# Patient Record
Sex: Male | Born: 1935 | Race: White | Hispanic: No | Marital: Married | Smoking: Former smoker
Health system: Southern US, Community
[De-identification: ages and names within clinical notes are randomized; demographics above are authoritative.]

## PROBLEM LIST (undated history)

## (undated) DIAGNOSIS — C61 Malignant neoplasm of prostate: Secondary | ICD-10-CM

## (undated) DIAGNOSIS — M1990 Unspecified osteoarthritis, unspecified site: Secondary | ICD-10-CM

## (undated) DIAGNOSIS — R42 Dizziness and giddiness: Secondary | ICD-10-CM

## (undated) DIAGNOSIS — J189 Pneumonia, unspecified organism: Secondary | ICD-10-CM

## (undated) DIAGNOSIS — H9190 Unspecified hearing loss, unspecified ear: Secondary | ICD-10-CM

## (undated) DIAGNOSIS — R351 Nocturia: Secondary | ICD-10-CM

## (undated) DIAGNOSIS — R1310 Dysphagia, unspecified: Secondary | ICD-10-CM

## (undated) DIAGNOSIS — Z860100 Personal history of colon polyps, unspecified: Secondary | ICD-10-CM

## (undated) DIAGNOSIS — M549 Dorsalgia, unspecified: Secondary | ICD-10-CM

## (undated) DIAGNOSIS — Z9181 History of falling: Principal | ICD-10-CM

## (undated) DIAGNOSIS — R2681 Unsteadiness on feet: Secondary | ICD-10-CM

## (undated) DIAGNOSIS — F329 Major depressive disorder, single episode, unspecified: Secondary | ICD-10-CM

## (undated) DIAGNOSIS — Z8546 Personal history of malignant neoplasm of prostate: Secondary | ICD-10-CM

## (undated) DIAGNOSIS — G4733 Obstructive sleep apnea (adult) (pediatric): Secondary | ICD-10-CM

## (undated) DIAGNOSIS — G4730 Sleep apnea, unspecified: Secondary | ICD-10-CM

## (undated) DIAGNOSIS — D126 Benign neoplasm of colon, unspecified: Secondary | ICD-10-CM

## (undated) DIAGNOSIS — K649 Unspecified hemorrhoids: Secondary | ICD-10-CM

## (undated) DIAGNOSIS — E785 Hyperlipidemia, unspecified: Secondary | ICD-10-CM

## (undated) DIAGNOSIS — K225 Diverticulum of esophagus, acquired: Secondary | ICD-10-CM

## (undated) DIAGNOSIS — I10 Essential (primary) hypertension: Secondary | ICD-10-CM

## (undated) DIAGNOSIS — IMO0001 Reserved for inherently not codable concepts without codable children: Secondary | ICD-10-CM

## (undated) DIAGNOSIS — L853 Xerosis cutis: Secondary | ICD-10-CM

## (undated) HISTORY — DX: Unspecified osteoarthritis, unspecified site: M19.90

## (undated) HISTORY — DX: Obstructive sleep apnea (adult) (pediatric): G47.33

## (undated) HISTORY — DX: Personal history of malignant neoplasm of prostate: Z85.46

## (undated) HISTORY — PX: CATARACT EXTRACTION: SUR2

## (undated) HISTORY — DX: History of falling: Z91.81

## (undated) HISTORY — DX: Malignant neoplasm of prostate: C61

## (undated) HISTORY — DX: Dysphagia, unspecified: R13.10

## (undated) HISTORY — DX: Sleep apnea, unspecified: G47.30

## (undated) HISTORY — DX: Hyperlipidemia, unspecified: E78.5

## (undated) HISTORY — DX: Unspecified hearing loss, unspecified ear: H91.90

## (undated) HISTORY — PX: TONSILLECTOMY: SUR1361

## (undated) HISTORY — DX: Unsteadiness on feet: R26.81

## (undated) HISTORY — DX: Personal history of colonic polyps: Z86.010

## (undated) HISTORY — DX: Essential (primary) hypertension: I10

## (undated) HISTORY — DX: Benign neoplasm of colon, unspecified: D12.6

## (undated) HISTORY — PX: COLONOSCOPY: SHX174

## (undated) HISTORY — DX: Major depressive disorder, single episode, unspecified: F32.9

---

## 1996-06-22 HISTORY — PX: LUMBAR LAMINECTOMY: SHX95

## 1999-04-04 ENCOUNTER — Encounter: Admission: RE | Admit: 1999-04-04 | Discharge: 1999-04-04 | Payer: Self-pay | Admitting: *Deleted

## 1999-06-23 HISTORY — PX: KNEE ARTHROSCOPY: SUR90

## 2003-08-30 ENCOUNTER — Encounter: Payer: Self-pay | Admitting: Internal Medicine

## 2003-11-13 ENCOUNTER — Encounter: Admission: RE | Admit: 2003-11-13 | Discharge: 2003-11-13 | Payer: Self-pay | Admitting: Internal Medicine

## 2003-12-12 ENCOUNTER — Encounter: Admission: RE | Admit: 2003-12-12 | Discharge: 2003-12-12 | Payer: Self-pay | Admitting: Neurosurgery

## 2004-03-30 ENCOUNTER — Ambulatory Visit (HOSPITAL_BASED_OUTPATIENT_CLINIC_OR_DEPARTMENT_OTHER): Admission: RE | Admit: 2004-03-30 | Discharge: 2004-03-30 | Payer: Self-pay | Admitting: Internal Medicine

## 2004-04-03 ENCOUNTER — Inpatient Hospital Stay (HOSPITAL_COMMUNITY): Admission: EM | Admit: 2004-04-03 | Discharge: 2004-04-04 | Payer: Self-pay | Admitting: Emergency Medicine

## 2004-04-30 ENCOUNTER — Ambulatory Visit: Payer: Self-pay | Admitting: Internal Medicine

## 2004-07-31 ENCOUNTER — Ambulatory Visit: Payer: Self-pay | Admitting: Internal Medicine

## 2004-12-08 ENCOUNTER — Ambulatory Visit: Payer: Self-pay | Admitting: Internal Medicine

## 2005-03-10 ENCOUNTER — Ambulatory Visit: Payer: Self-pay | Admitting: Internal Medicine

## 2005-04-07 ENCOUNTER — Ambulatory Visit: Payer: Self-pay | Admitting: Internal Medicine

## 2005-04-28 ENCOUNTER — Ambulatory Visit: Payer: Self-pay | Admitting: Internal Medicine

## 2005-05-15 ENCOUNTER — Ambulatory Visit: Payer: Self-pay | Admitting: Internal Medicine

## 2005-12-22 ENCOUNTER — Ambulatory Visit: Admission: RE | Admit: 2005-12-22 | Discharge: 2006-02-10 | Payer: Self-pay | Admitting: Radiation Oncology

## 2005-12-29 ENCOUNTER — Ambulatory Visit (HOSPITAL_COMMUNITY): Admission: RE | Admit: 2005-12-29 | Discharge: 2005-12-29 | Payer: Self-pay | Admitting: Urology

## 2006-01-28 ENCOUNTER — Ambulatory Visit: Payer: Self-pay | Admitting: Internal Medicine

## 2006-03-26 ENCOUNTER — Ambulatory Visit: Payer: Self-pay | Admitting: Internal Medicine

## 2006-06-22 HISTORY — PX: RADIOACTIVE SEED IMPLANT: SHX5150

## 2006-06-29 ENCOUNTER — Ambulatory Visit: Admission: RE | Admit: 2006-06-29 | Discharge: 2006-09-15 | Payer: Self-pay | Admitting: Radiation Oncology

## 2006-08-04 ENCOUNTER — Encounter: Admission: RE | Admit: 2006-08-04 | Discharge: 2006-08-04 | Payer: Self-pay | Admitting: Urology

## 2006-08-11 ENCOUNTER — Ambulatory Visit (HOSPITAL_BASED_OUTPATIENT_CLINIC_OR_DEPARTMENT_OTHER): Admission: RE | Admit: 2006-08-11 | Discharge: 2006-08-11 | Payer: Self-pay | Admitting: Urology

## 2006-11-02 ENCOUNTER — Ambulatory Visit: Payer: Self-pay | Admitting: Internal Medicine

## 2006-11-02 DIAGNOSIS — E785 Hyperlipidemia, unspecified: Secondary | ICD-10-CM | POA: Insufficient documentation

## 2006-11-02 DIAGNOSIS — I10 Essential (primary) hypertension: Secondary | ICD-10-CM | POA: Insufficient documentation

## 2006-11-02 DIAGNOSIS — E119 Type 2 diabetes mellitus without complications: Secondary | ICD-10-CM

## 2006-11-02 DIAGNOSIS — Z8546 Personal history of malignant neoplasm of prostate: Secondary | ICD-10-CM

## 2006-11-02 LAB — CONVERTED CEMR LAB
Basophils Absolute: 0 10*3/uL (ref 0.0–0.1)
Basophils Relative: 0.1 % (ref 0.0–1.0)
Eosinophils Absolute: 0 10*3/uL (ref 0.0–0.6)
Eosinophils Relative: 0.6 % (ref 0.0–5.0)
HCT: 37.4 % — ABNORMAL LOW (ref 39.0–52.0)
Hemoglobin: 13 g/dL (ref 13.0–17.0)
Lymphocytes Relative: 14.8 % (ref 12.0–46.0)
MCHC: 34.9 g/dL (ref 30.0–36.0)
MCV: 91.7 fL (ref 78.0–100.0)
Monocytes Absolute: 0.5 10*3/uL (ref 0.2–0.7)
Monocytes Relative: 9.1 % (ref 3.0–11.0)
Neutro Abs: 4.6 10*3/uL (ref 1.4–7.7)
Neutrophils Relative %: 75.4 % (ref 43.0–77.0)
Platelets: 210 10*3/uL (ref 150–400)
RBC: 4.07 M/uL — ABNORMAL LOW (ref 4.22–5.81)
RDW: 12.4 % (ref 11.5–14.6)
WBC: 6 10*3/uL (ref 4.5–10.5)

## 2006-11-04 ENCOUNTER — Encounter: Admission: RE | Admit: 2006-11-04 | Discharge: 2006-11-04 | Payer: Self-pay | Admitting: Internal Medicine

## 2006-11-05 ENCOUNTER — Encounter: Payer: Self-pay | Admitting: Internal Medicine

## 2006-11-05 ENCOUNTER — Ambulatory Visit: Payer: Self-pay

## 2006-12-16 ENCOUNTER — Encounter: Payer: Self-pay | Admitting: Internal Medicine

## 2007-04-01 ENCOUNTER — Ambulatory Visit: Payer: Self-pay | Admitting: Internal Medicine

## 2007-07-29 ENCOUNTER — Ambulatory Visit: Payer: Self-pay | Admitting: Internal Medicine

## 2007-08-01 LAB — CONVERTED CEMR LAB
ALT: 24 units/L (ref 0–53)
AST: 27 units/L (ref 0–37)
Albumin: 4.1 g/dL (ref 3.5–5.2)
Alkaline Phosphatase: 52 units/L (ref 39–117)
BUN: 15 mg/dL (ref 6–23)
Bilirubin, Direct: 0.2 mg/dL (ref 0.0–0.3)
CO2: 29 meq/L (ref 19–32)
Calcium: 9.6 mg/dL (ref 8.4–10.5)
Chloride: 106 meq/L (ref 96–112)
Cholesterol: 137 mg/dL (ref 0–200)
Creatinine, Ser: 1.1 mg/dL (ref 0.4–1.5)
Creatinine,U: 110 mg/dL
GFR calc Af Amer: 83 mL/min
GFR calc non Af Amer: 68 mL/min
Glucose, Bld: 101 mg/dL — ABNORMAL HIGH (ref 70–99)
HDL: 49.7 mg/dL (ref >39.0)
Hgb A1c MFr Bld: 6.2 % — ABNORMAL HIGH (ref 4.6–6.0)
LDL Cholesterol: 72 mg/dL (ref 0–99)
Microalb Creat Ratio: 15.5 mg/g (ref 0.0–30.0)
Microalb, Ur: 1.7 mg/dL (ref 0.0–1.9)
Potassium: 4.9 meq/L (ref 3.5–5.1)
Sodium: 142 meq/L (ref 135–145)
Total Bilirubin: 0.9 mg/dL (ref 0.3–1.2)
Total CHOL/HDL Ratio: 2.8
Total Protein: 6.3 g/dL (ref 6.0–8.3)
Triglycerides: 76 mg/dL (ref 0–149)
VLDL: 15 mg/dL (ref 0–40)

## 2007-08-19 ENCOUNTER — Ambulatory Visit: Payer: Self-pay | Admitting: Gastroenterology

## 2007-08-19 LAB — CONVERTED CEMR LAB: PSA: 0.09 ng/mL

## 2007-08-21 DIAGNOSIS — D126 Benign neoplasm of colon, unspecified: Secondary | ICD-10-CM

## 2007-08-21 HISTORY — DX: Benign neoplasm of colon, unspecified: D12.6

## 2007-08-29 ENCOUNTER — Ambulatory Visit: Payer: Self-pay | Admitting: Internal Medicine

## 2007-09-06 ENCOUNTER — Encounter: Payer: Self-pay | Admitting: Gastroenterology

## 2007-09-06 ENCOUNTER — Ambulatory Visit: Payer: Self-pay | Admitting: Gastroenterology

## 2007-09-06 ENCOUNTER — Encounter: Payer: Self-pay | Admitting: Internal Medicine

## 2008-02-16 ENCOUNTER — Telehealth: Payer: Self-pay | Admitting: *Deleted

## 2008-02-28 ENCOUNTER — Ambulatory Visit: Payer: Self-pay | Admitting: Internal Medicine

## 2008-02-28 LAB — CONVERTED CEMR LAB
ALT: 23 units/L (ref 0–53)
AST: 23 units/L (ref 0–37)
Albumin: 4 g/dL (ref 3.5–5.2)
Alkaline Phosphatase: 45 units/L (ref 39–117)
BUN: 18 mg/dL (ref 6–23)
Bilirubin, Direct: 0.1 mg/dL (ref 0.0–0.3)
CO2: 28 meq/L (ref 19–32)
Calcium: 9 mg/dL (ref 8.4–10.5)
Chloride: 109 meq/L (ref 96–112)
Cholesterol: 123 mg/dL (ref 0–200)
Creatinine, Ser: 1 mg/dL (ref 0.4–1.5)
GFR calc Af Amer: 92 mL/min
GFR calc non Af Amer: 76 mL/min
Glucose, Bld: 103 mg/dL — ABNORMAL HIGH (ref 70–99)
HDL: 46.5 mg/dL (ref >39.0)
Hgb A1c MFr Bld: 6.2 % — ABNORMAL HIGH (ref 4.6–6.0)
LDL Cholesterol: 62 mg/dL (ref 0–99)
Potassium: 4.2 meq/L (ref 3.5–5.1)
Sodium: 144 meq/L (ref 135–145)
Total Bilirubin: 1.2 mg/dL (ref 0.3–1.2)
Total CHOL/HDL Ratio: 2.6
Total Protein: 6.4 g/dL (ref 6.0–8.3)
Triglycerides: 73 mg/dL (ref 0–149)
VLDL: 15 mg/dL (ref 0–40)

## 2008-03-06 ENCOUNTER — Ambulatory Visit: Payer: Self-pay | Admitting: Internal Medicine

## 2008-04-03 ENCOUNTER — Ambulatory Visit: Payer: Self-pay | Admitting: Internal Medicine

## 2008-04-12 ENCOUNTER — Ambulatory Visit: Payer: Self-pay | Admitting: Internal Medicine

## 2008-06-05 ENCOUNTER — Ambulatory Visit: Payer: Self-pay | Admitting: Internal Medicine

## 2008-06-06 LAB — CONVERTED CEMR LAB
ALT: 21 units/L (ref 0–53)
AST: 20 units/L (ref 0–37)
BUN: 22 mg/dL (ref 6–23)
CO2: 28 meq/L (ref 19–32)
Calcium: 9.1 mg/dL (ref 8.4–10.5)
Chloride: 109 meq/L (ref 96–112)
Cholesterol: 132 mg/dL (ref 0–200)
Creatinine, Ser: 1.1 mg/dL (ref 0.4–1.5)
GFR calc Af Amer: 82 mL/min
GFR calc non Af Amer: 68 mL/min
Glucose, Bld: 114 mg/dL — ABNORMAL HIGH (ref 70–99)
HDL: 56.7 mg/dL (ref >39.0)
Hgb A1c MFr Bld: 6.2 % — ABNORMAL HIGH (ref 4.6–6.0)
LDL Cholesterol: 61 mg/dL (ref 0–99)
Potassium: 4.1 meq/L (ref 3.5–5.1)
Sodium: 145 meq/L (ref 135–145)
Total CHOL/HDL Ratio: 2.3
Triglycerides: 70 mg/dL (ref 0–149)
VLDL: 14 mg/dL (ref 0–40)

## 2008-06-09 ENCOUNTER — Encounter: Admission: RE | Admit: 2008-06-09 | Discharge: 2008-06-09 | Payer: Self-pay | Admitting: Internal Medicine

## 2008-07-23 HISTORY — PX: ROTATOR CUFF REPAIR: SHX139

## 2008-07-24 ENCOUNTER — Ambulatory Visit (HOSPITAL_COMMUNITY): Admission: RE | Admit: 2008-07-24 | Discharge: 2008-07-25 | Payer: Self-pay | Admitting: Orthopedic Surgery

## 2008-08-02 ENCOUNTER — Telehealth: Payer: Self-pay | Admitting: Gastroenterology

## 2008-08-02 ENCOUNTER — Encounter (INDEPENDENT_AMBULATORY_CARE_PROVIDER_SITE_OTHER): Payer: Self-pay | Admitting: *Deleted

## 2008-08-07 ENCOUNTER — Ambulatory Visit: Payer: Self-pay | Admitting: Gastroenterology

## 2008-11-05 ENCOUNTER — Ambulatory Visit: Payer: Self-pay | Admitting: Gastroenterology

## 2008-11-05 DIAGNOSIS — Z860100 Personal history of colon polyps, unspecified: Secondary | ICD-10-CM

## 2008-11-05 HISTORY — DX: Personal history of colon polyps, unspecified: Z86.0100

## 2008-11-05 HISTORY — DX: Personal history of colonic polyps: Z86.010

## 2008-11-07 ENCOUNTER — Ambulatory Visit: Payer: Self-pay | Admitting: Gastroenterology

## 2008-11-07 ENCOUNTER — Encounter: Payer: Self-pay | Admitting: Gastroenterology

## 2008-11-07 ENCOUNTER — Encounter: Payer: Self-pay | Admitting: Internal Medicine

## 2008-11-07 ENCOUNTER — Ambulatory Visit: Payer: Self-pay

## 2008-11-09 ENCOUNTER — Encounter: Payer: Self-pay | Admitting: Gastroenterology

## 2009-03-29 ENCOUNTER — Ambulatory Visit: Payer: Self-pay | Admitting: Internal Medicine

## 2009-04-16 ENCOUNTER — Telehealth: Payer: Self-pay | Admitting: Internal Medicine

## 2009-05-02 ENCOUNTER — Telehealth: Payer: Self-pay | Admitting: Internal Medicine

## 2009-05-03 ENCOUNTER — Ambulatory Visit: Payer: Self-pay | Admitting: Family Medicine

## 2009-06-19 ENCOUNTER — Encounter: Payer: Self-pay | Admitting: Internal Medicine

## 2009-08-29 ENCOUNTER — Ambulatory Visit: Payer: Self-pay | Admitting: Internal Medicine

## 2009-08-29 LAB — CONVERTED CEMR LAB

## 2009-09-02 LAB — CONVERTED CEMR LAB
ALT: 22 units/L (ref 0–53)
AST: 20 units/L (ref 0–37)
Albumin: 4.1 g/dL (ref 3.5–5.2)
Alkaline Phosphatase: 56 units/L (ref 39–117)
BUN: 19 mg/dL (ref 6–23)
Basophils Absolute: 0 10*3/uL (ref 0.0–0.1)
Basophils Relative: 0.1 % (ref 0.0–3.0)
Bilirubin, Direct: 0 mg/dL (ref 0.0–0.3)
CO2: 29 meq/L (ref 19–32)
Calcium: 9 mg/dL (ref 8.4–10.5)
Chloride: 110 meq/L (ref 96–112)
Cholesterol: 141 mg/dL (ref 0–200)
Creatinine, Ser: 1.1 mg/dL (ref 0.4–1.5)
Eosinophils Absolute: 0.1 10*3/uL (ref 0.0–0.7)
Eosinophils Relative: 1.6 % (ref 0.0–5.0)
GFR calc non Af Amer: 67.8 mL/min (ref >60)
Glucose, Bld: 100 mg/dL — ABNORMAL HIGH (ref 70–99)
HCT: 39.7 % (ref 39.0–52.0)
HDL: 62.2 mg/dL (ref >39.00)
Hemoglobin: 13.2 g/dL (ref 13.0–17.0)
Hgb A1c MFr Bld: 6.3 % (ref 4.6–6.5)
LDL Cholesterol: 58 mg/dL (ref 0–99)
Lymphocytes Relative: 17.3 % (ref 12.0–46.0)
Lymphs Abs: 1.1 10*3/uL (ref 0.7–4.0)
MCHC: 33.3 g/dL (ref 30.0–36.0)
MCV: 96.8 fL (ref 78.0–100.0)
Monocytes Absolute: 0.4 10*3/uL (ref 0.1–1.0)
Monocytes Relative: 6.8 % (ref 3.0–12.0)
Neutro Abs: 4.6 10*3/uL (ref 1.4–7.7)
Neutrophils Relative %: 74.2 % (ref 43.0–77.0)
Platelets: 190 10*3/uL (ref 150.0–400.0)
Potassium: 3.9 meq/L (ref 3.5–5.1)
RBC: 4.1 M/uL — ABNORMAL LOW (ref 4.22–5.81)
RDW: 12.6 % (ref 11.5–14.6)
Sodium: 144 meq/L (ref 135–145)
TSH: 2.71 microintl units/mL (ref 0.35–5.50)
Total Bilirubin: 1.1 mg/dL (ref 0.3–1.2)
Total CHOL/HDL Ratio: 2
Total Protein: 6.9 g/dL (ref 6.0–8.3)
Triglycerides: 102 mg/dL (ref 0.0–149.0)
VLDL: 20.4 mg/dL (ref 0.0–40.0)
WBC: 6.2 10*3/uL (ref 4.5–10.5)

## 2009-09-05 IMAGING — CR DG CHEST 1V PORT
1 series · 1 of 1 positions shown · non-contrast
Comparison: 07/19/2008

CLINICAL DATA: Low O2 sat postop

PORTABLE CHEST - 1 VIEW

[view not recorded]
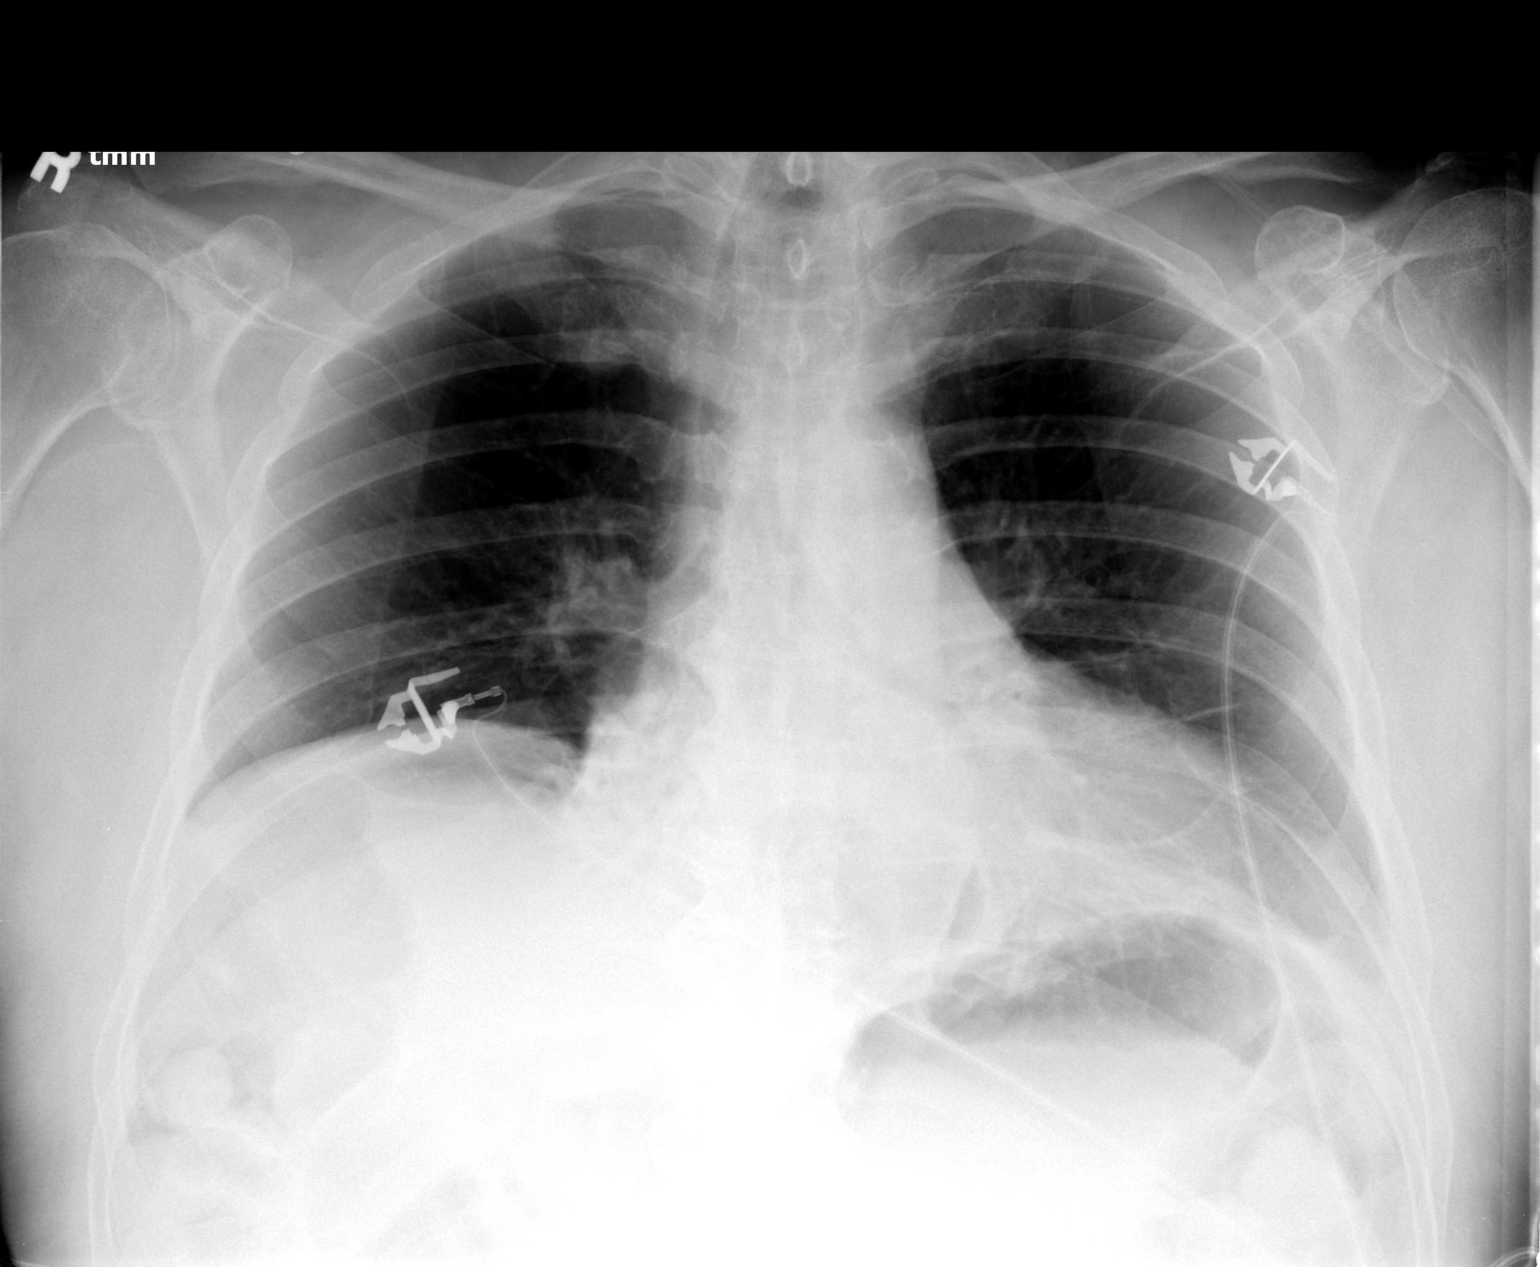

[1 of 1 positions shown; findings below may reference images not displayed]

FINDINGS: There is a moderate bilateral lower lobe subsegmental
atelectasis - left greater than right.  This is a new finding.  No
heart failure or pleural fluid.
IMPRESSION: Moderate postoperative basilar atelectasis.

## 2009-10-25 ENCOUNTER — Encounter (INDEPENDENT_AMBULATORY_CARE_PROVIDER_SITE_OTHER): Payer: Self-pay | Admitting: *Deleted

## 2009-12-17 ENCOUNTER — Ambulatory Visit: Payer: Self-pay | Admitting: Gastroenterology

## 2009-12-31 ENCOUNTER — Ambulatory Visit: Payer: Self-pay | Admitting: Gastroenterology

## 2010-01-03 ENCOUNTER — Encounter: Payer: Self-pay | Admitting: Gastroenterology

## 2010-01-16 ENCOUNTER — Telehealth: Payer: Self-pay | Admitting: Internal Medicine

## 2010-03-21 ENCOUNTER — Ambulatory Visit: Payer: Self-pay | Admitting: Internal Medicine

## 2010-05-02 ENCOUNTER — Ambulatory Visit: Payer: Self-pay | Admitting: Internal Medicine

## 2010-05-02 DIAGNOSIS — M549 Dorsalgia, unspecified: Secondary | ICD-10-CM | POA: Insufficient documentation

## 2010-07-22 NOTE — Assessment & Plan Note (Signed)
Summary: flu shot/njr   Nurse Visit   Allergies: No Known Drug Allergies  Orders Added: 1)  Flu Vaccine 8yrs + MEDICARE PATIENTS [Q2039] 2)  Administration Flu vaccine - MCR [G0008]       Flu Vaccine Consent Questions     Do you have a history of severe allergic reactions to this vaccine? no    Any prior history of allergic reactions to egg and/or gelatin? no    Do you have a sensitivity to the preservative Thimersol? no    Do you have a past history of Guillan-Barre Syndrome? no    Do you currently have an acute febrile illness? no    Have you ever had a severe reaction to latex? no    Vaccine information given and explained to patient? yes    Are you currently pregnant? no    Lot Number:[id number]   Exp Date:12/20/2010   Site Given  Left Deltoid IM

## 2010-07-22 NOTE — Assessment & Plan Note (Signed)
Summary: Darius Castro will come in fasting/njr/Darius Castro rescd from bump//ccm   Vital Signs:  Patient profile:   77 year old male Height:      68 inches Weight:      174 pounds Temp:     98.2 degrees F oral Pulse rate:   72 / minute Pulse rhythm:   regular Resp:     12 per minute BP sitting:   118 / 72  (left arm) Cuff size:   regular  Vitals Entered By: Gladis Riffle, RN (August 29, 2009 11:28 AM) CC: annual review, fasting Is Patient Diabetic? No   Primary Care Provider:  Birdie Sons, MD  CC:  annual review and fasting.  History of Present Illness: Here for Medicare AWV:  1.   Risk factors based on Past M, S, F history:see note 2.   Physical Activities: he remains physically active 3.   Depression/mood: -denies depression 4.   Hearing: no problem 5.   ADL's: he isa ble to do all of ADLs 6.   Fall Risk: no hx of falls 7.   Home Safety: lives independently at friend's home  Current Problems: here for evaluation PROSTATE CANCER, HX OF (ICD-V10.46)--no known recurrence HYPERTENSION (ICD-401.9)-patient denies chest pain, shortness breath, PND, orthopnea. HYPERLIPIDEMIA (ICD-272.4)--patient tolerating medications without difficulty. DIABETES MELLITUS, TYPE II (ICD-250.00)--diet controlled.  Patient has not required medications in the past.  Patient remains quite active and exercises several times weekly.  All other systems reviewed and were negative      Preventive Screening-Counseling & Management  Alcohol-Tobacco     Smoking Status: never  Current Problems (verified): 1)  Colonic Polyps, Hx of  (ICD-V12.72) 2)  Preventive Health Care  (ICD-V70.0) 3)  Prostate Cancer, Hx of  (ICD-V10.46) 4)  Hypertension  (ICD-401.9) 5)  Hyperlipidemia  (ICD-272.4) 6)  Diabetes Mellitus, Type II  (ICD-250.00)  Current Medications (verified): 1)  Fosinopril Sodium 40 Mg Tabs (Fosinopril Sodium) .... Take 1 Tablet By Mouth Once A Day 2)  Lipitor 40 Mg Tabs (Atorvastatin Calcium) .... Take 1 Tablet  By Mouth Once A Day 3)  Topamax 25 Mg  Tabs (Topiramate) .... Take 1 Tablet By Mouth Once A Day 4)  Aspirin 325 Mg Tabs (Aspirin) .... Once Daily 5)  Vitamin C Cr 500 Mg Cr-Tabs (Ascorbic Acid) .... Once Daily 6)  Centrum Silver  Tabs (Multiple Vitamins-Minerals) .... Once Daily 7)  Vitamin D 1000 Unit Tabs (Cholecalciferol) .... Take One By Mouth Once Daily  Allergies (verified): No Known Drug Allergies  Past History:  Past Medical History: Last updated: 11/05/2008 Diabetes mellitus, type II-diet controlled  Hyperlipidemia Hypertension Prostate cancer, hx of-seed implants 2008 Hemorrhoids, hx of Arthritis Sleep Apnea  Adenomatous Colon Polyps 08/2007  Past Surgical History: Last updated: 11/05/2008 Lumbar laminectomy  1998 Tonsillectomy, as child knee arthroscopy R  2001 right rotator cuff 07/2008  Family History: Last updated: 11/05/2008 Family History of CAD Male 1st degree relative <50 mother deceased 69 Family History of Colon Polyps: Daughter in her 52's No FH of Colon Cancer:  Social History: Last updated: 11/05/2008 Retired Married Never Smoked Alcohol Use - no Illicit Drug Use - no Patient gets regular exercise.  Risk Factors: Exercise: yes (11/05/2008)  Risk Factors: Smoking Status: never (08/29/2009)  Review of Systems       All other systems reviewed and were negative   Physical Exam  General:  alert and well-developed.   Head:  normocephalic and atraumatic.   Eyes:  pupils equal and pupils round.  Ears:  R ear normal and L ear normal.   Neck:  supple no adenopathy Lungs:  normal respiratory effort and no intercostal retractions.   Abdomen:  soft and non-tender.   Msk:  No deformity or scoliosis noted of thoracic or lumbar spine.   Neurologic:  cranial nerves II-XII intact and gait normal.   Skin:  turgor normal and color normal.   Psych:  memory intact for recent and remote and good eye contact.     Impression &  Recommendations:  Problem # 1:  PROSTATE CANCER, HX OF (ICD-V10.46)  followed by urology  Orders: TLB-CBC Platelet - w/Differential (85025-CBCD)  Problem # 2:  HYPERTENSION (ICD-401.9)  controlled continue current medications  His updated medication list for this problem includes:    Fosinopril Sodium 40 Mg Tabs (Fosinopril sodium) .Marland Kitchen... Take 1 tablet by mouth once a day  BP today: 118/72 Prior BP: 180/100 (05/03/2009)  Labs Reviewed: K+: 4.1 (06/05/2008) Creat: : 1.1 (06/05/2008)   Chol: 132 (06/05/2008)   HDL: 56.7 (06/05/2008)   LDL: 61 (06/05/2008)   TG: 70 (06/05/2008)  Orders: TLB-BMP (Basic Metabolic Panel-BMET) (80048-METABOL)  Problem # 3:  DIABETES MELLITUS, TYPE II (ICD-250.00)  needs lab s controlled on no meds His updated medication list for this problem includes:    Fosinopril Sodium 40 Mg Tabs (Fosinopril sodium) .Marland Kitchen... Take 1 tablet by mouth once a day    Aspirin 325 Mg Tabs (Aspirin) ..... Once daily  Labs Reviewed: Creat: 1.1 (06/05/2008)     Last Eye Exam: normal (11/21/2007) Reviewed HgBA1c results: 6.2 (06/05/2008)  6.2 (02/28/2008)  Orders: TLB-A1C / Hgb A1C (Glycohemoglobin) (83036-A1C)  Problem # 4:  HYPERLIPIDEMIA (ICD-272.4)  labs today continue current medications  His updated medication list for this problem includes:    Lipitor 40 Mg Tabs (Atorvastatin calcium) .Marland Kitchen... Take 1 tablet by mouth once a day  Labs Reviewed: SGOT: 20 (06/05/2008)   SGPT: 21 (06/05/2008)   HDL:56.7 (06/05/2008), 46.5 (02/28/2008)  LDL:61 (06/05/2008), 62 (02/28/2008)  Chol:132 (06/05/2008), 123 (02/28/2008)  Trig:70 (06/05/2008), 73 (02/28/2008)  Orders: Venipuncture (62952) TLB-Hepatic/Liver Function Pnl (80076-HEPATIC) TLB-Lipid Panel (80061-LIPID) TLB-TSH (Thyroid Stimulating Hormone) (84443-TSH)  Problem # 5:  PREVENTIVE HEALTH CARE (ICD-V70.0)  wellness medicare appropriate labs ordered see history of present illness for details.  Orders: EMR  Misc Charge Code [facility])  Complete Medication List: 1)  Fosinopril Sodium 40 Mg Tabs (Fosinopril sodium) .... Take 1 tablet by mouth once a day 2)  Lipitor 40 Mg Tabs (Atorvastatin calcium) .... Take 1 tablet by mouth once a day 3)  Topamax 25 Mg Tabs (Topiramate) .... Take 1 tablet by mouth once a day 4)  Aspirin 325 Mg Tabs (Aspirin) .... Once daily 5)  Vitamin C Cr 500 Mg Cr-tabs (Ascorbic acid) .... Once daily 6)  Centrum Silver Tabs (Multiple vitamins-minerals) .... Once daily 7)  Vitamin D 1000 Unit Tabs (Cholecalciferol) .... Take one by mouth once daily  Other Orders: TD Toxoids IM 7 YR + (84132) Admin 1st Vaccine (44010) Pneumococcal Vaccine (27253) Admin of Any Addtl Vaccine (66440)   Preventive Care Screening  Colonoscopy:    Date:  11/07/2008    Next Due:  11/2009    Results:  abnormal     Immunizations Administered:  Tetanus Vaccine:    Vaccine Type: Td    Site: left deltoid    Mfr: Sanofi Pasteur    Dose: 0.5 ml    Route: IM    Given by: Gladis Riffle, RN  Exp. Date: 10/03/2010    Lot #: [id number]  Pneumonia Vaccine:    Vaccine Type: Pneumovax (Medicare)    Site: right deltoid    Mfr: Merck    Dose: 0.5 ml    Route: IM    Given by: Gladis Riffle, RN    Exp. Date: 10/10/2010    Lot #: [id number]  Prevention & Chronic Care Immunizations   Influenza vaccine: Fluvax 3+  (03/29/2009)   Influenza vaccine due: 02/20/2010    Tetanus booster: 08/29/2009: Td   Tetanus booster due: 08/30/2019    Pneumococcal vaccine: Pneumovax (Medicare)  (08/29/2009)   Pneumococcal vaccine due: 08/30/2014    H. zoster vaccine: 01/28/2006: Zostavax  Colorectal Screening   Hemoccult: Not documented   Hemoccult action/deferral: Not indicated  (08/29/2009)    Colonoscopy: abnormal  (11/07/2008)   Colonoscopy action/deferral: Not indicated  (08/29/2009)   Colonoscopy due: 11/2009  Other Screening   PSA: 0.09  (08/19/2007)   PSA action/deferral: Not indicated   (08/29/2009)   Smoking status: never  (08/29/2009)  Diabetes Mellitus   HgbA1C: 6.2  (06/05/2008)   HgbA1C action/deferral: Ordered  (08/29/2009)    Eye exam: normal  (11/21/2007)    Foot exam: Not documented   High risk foot: Not documented   Foot care education: Not documented    Urine microalbumin/creatinine ratio: 15.5  (07/29/2007)    Diabetes flowsheet reviewed?: Yes   Progress toward A1C goal: At goal  Lipids   Total Cholesterol: 132  (06/05/2008)   LDL: 61  (06/05/2008)   LDL Direct: Not documented   HDL: 56.7  (06/05/2008)   Triglycerides: 70  (06/05/2008)    SGOT (AST): 20  (06/05/2008)   SGPT (ALT): 21  (06/05/2008)   Alkaline phosphatase: 45  (02/28/2008)   Total bilirubin: 1.2  (02/28/2008)    Lipid flowsheet reviewed?: Yes   Progress toward LDL goal: At goal  Hypertension   Last Blood Pressure: 118 / 72  (08/29/2009)   Serum creatinine: 1.1  (06/05/2008)   BMP action: Ordered   Serum potassium 4.1  (06/05/2008)    Hypertension flowsheet reviewed?: Yes   Progress toward BP goal: At goal  Self-Management Support :    Diabetes self-management support: Not documented    Hypertension self-management support: Not documented    Lipid self-management support: Not documented    Nursing Instructions: Give Pneumovax today HgbA1C today (see order)

## 2010-07-22 NOTE — Progress Notes (Signed)
Summary: refill  Phone Note Refill Request Message from:  Fax from Pharmacy on January 16, 2010 11:53 AM  Refills Requested: Medication #1:  TOPAMAX 25 MG  TABS Take 1 tablet by mouth once a day Initial call taken by: Kern Reap CMA Duncan Dull),  January 16, 2010 11:53 AM    Prescriptions: TOPAMAX 25 MG  TABS (TOPIRAMATE) Take 1 tablet by mouth once a day  #90 x 1   Entered by:   Kern Reap CMA (AAMA)   Authorized by:   Birdie Sons MD   Signed by:   Kern Reap CMA (AAMA) on 01/16/2010   Method used:   Electronically to        [facility]* (mail-order)       [address] [place], [place]  [address]       Ph: [phone]       Fax: [phone]   RxID:   [id number]

## 2010-07-22 NOTE — Assessment & Plan Note (Signed)
Summary: REC COL/OVER 80.Marland KitchenMarland KitchenAS.        History of Present Illness Visit Type: Follow-up Visit Primary GI MD: Elie Goody MD [facility] Primary Provider: Birdie Sons, MD Chief Complaint: Patient recieved a letter for recall colonoscopy and due to his age he needed an office visit. He states he is doing great.  History of Present Illness:   Darius Castro returns for followup of a large ascending colon polyp. He underwent piecemeal polypectomy in March 2009. Followup colonoscopy in May 2010 showed a persistent polyp in the descending colon which was again removed by piecemeal technique. He returns  today with no gastrointestinal complaints. He has stable hypertension, hyperlipidemia, diabetes mellitus, and sleep apnea.    GI Review of Systems      Denies abdominal pain, acid reflux, belching, bloating, chest pain, dysphagia with liquids, dysphagia with solids, heartburn, loss of appetite, nausea, vomiting, vomiting blood, weight loss, and  weight gain.        Denies anal fissure, black tarry stools, change in bowel habit, constipation, diarrhea, diverticulosis, fecal incontinence, heme positive stool, hemorrhoids, irritable bowel syndrome, jaundice, light color stool, liver problems, rectal bleeding, and  rectal pain.   Current Medications (verified): 1)  Fosinopril Sodium 40 Mg Tabs (Fosinopril Sodium) .... Take 1 Tablet By Mouth Once A Day 2)  Lipitor 40 Mg Tabs (Atorvastatin Calcium) .... Take 1 Tablet By Mouth Once A Day 3)  Topamax 25 Mg  Tabs (Topiramate) .... Take 1 Tablet By Mouth Once A Day 4)  Aspirin 325 Mg Tabs (Aspirin) .... Once Daily 5)  Vitamin C Cr 500 Mg Cr-Tabs (Ascorbic Acid) .... Once Daily 6)  Centrum Silver  Tabs (Multiple Vitamins-Minerals) .... Once Daily 7)  Vitamin D 1000 Unit Tabs (Cholecalciferol) .... Take One By Mouth Once Daily 8)  Zyrtec Allergy 10 Mg Caps (Cetirizine Hcl) .... Take One By Mouth Once Daily  Allergies (verified): No Known Drug  Allergies  Past History:  Past Medical History: Diabetes mellitus, type II-diet controlled  Hyperlipidemia Hypertension Prostate cancer, hx of-seed implants 2008 Hemorrhoids, hx of Arthritis Sleep Apnea  Adenomatous Colon Polyps 08/2007  Past Surgical History: Lumbar laminectomy  1998 Tonsillectomy, as child knee arthroscopy R  2001 right rotator cuff 07/2008 Cataract Extraction bilateral  Family History: Reviewed history from 11/05/2008 and no changes required. Family History of CAD Male 1st degree relative <50 mother deceased 2 Family History of Colon Polyps: Daughter in her 56's No FH of Colon Cancer:  Social History: Reviewed history from 11/05/2008 and no changes required. Retired Married Never Smoked Alcohol Use - no Illicit Drug Use - no Patient gets regular exercise.  Review of Systems       The patient complains of allergy/sinus, hearing problems, urination - excessive, and urine leakage.         The pertinent positives and negatives are noted as above and in the HPI. All other ROS were reviewed and were negative.   Vital Signs:  Patient profile:   77 year old male Height:      68 inches Weight:      173.4 pounds BMI:     26.46 Pulse rate:   72 / minute Pulse rhythm:   regular BP sitting:   126 / 66  (left arm) Cuff size:   regular  Vitals Entered By: Darius Castro CMA Duncan Dull) (December 17, 2009 3:41 PM)  Physical Exam  General:  Well developed, well nourished, no acute distress. Head:  Normocephalic and atraumatic. Eyes:  PERRLA,  no icterus. Mouth:  No deformity or lesions, dentition normal. Lungs:  Clear throughout to auscultation. Heart:  Regular rate and rhythm; no murmurs, rubs,  or bruits. Abdomen:  Soft, nontender and nondistended. No masses, hepatosplenomegaly or hernias noted. Normal bowel sounds. Rectal:  deferred until time of colonoscopy.   Psych:  Alert and cooperative. Normal mood and affect.  Impression &  Recommendations:  Problem # 1:  COLONIC POLYPS, HX OF (ICD-V12.72)  Personal history of a large adenomatous polyp in the ascending colon, which was not completely removed in 2009 or in 2010. The site was tattooed. The risks, benefits and alternatives to colonoscopy with possible biopsy and possible polypectomy were discussed with the patient and they consent to proceed. The procedure will be scheduled electively.  Orders: Colonoscopy (Colon)  Patient Instructions: 1)  Colonoscopy brochure given.  2)  Please continue current medications.  3)  The medication list was reviewed and reconciled.  All changed / newly prescribed medications were explained.  A complete medication list was provided to the patient / caregiver.  Prescriptions: MOVIPREP 100 GM  SOLR (PEG-KCL-NACL-NASULF-NA ASC-C) As per prep instructions.  #1 x 0   Entered by:   Darius Castro CMA (AAMA)   Authorized by:   Meryl Dare MD [facility]   Signed by:   Meryl Dare MD [facility] on 12/17/2009   Method used:   Electronically to        [id number]* (retail)       [address]       [place], [place]  [address]       Ph: [phone] or [phone]       Fax: [phone]   RxID:   [id number]   Appended Document: REC COL/OVER 80.Marland KitchenMarland KitchenAS. CORRECTION IN HPI, should read as follows: Colonoscopy in May 2010 showed RESIDUAL polyp in the ASCENDING colon.

## 2010-07-22 NOTE — Letter (Signed)
Summary: Colonoscopy Letter  [facility]  [address] [place], [place] [address]   Phone: [phone]  Fax: [phone]      Oct 25, 2009 MRN: [id number]   [facility] [address] [place], [place]  [address]   Dear Mr. Muraoka,   According to your medical record, it is time for you to schedule a Colonoscopy. The American Cancer Society recommends this procedure as a method to detect early colon cancer. Patients with a family history of colon cancer, or a personal history of colon polyps or inflammatory bowel disease are at increased risk.  This letter has beeen generated based on the recommendations made at the time of your procedure. If you feel that in your particular situation this may no longer apply, please contact our office.  Please call our office at [phone] to schedule this appointment or to update your records at your earliest convenience.  Thank you for cooperating with [place] to provide you with the very best care possible.   Sincerely,  [name] T. Russella Dar, M.D.  [facility] [phone]

## 2010-07-22 NOTE — Assessment & Plan Note (Signed)
Summary: back pain/cdw   Vital Signs:  Patient profile:   77 year old male Weight:      176 pounds Temp:     97.6 degrees F oral BP sitting:   108 / 62  (left arm) Cuff size:   large  Vitals Entered By: Alfred Levins, CMA (May 02, 2010 10:01 AM) CC: back pain x10 days, Back pain   Primary Care Provider:  Birdie Sons, MD  CC:  back pain x10 days and Back pain.  History of Present Illness:  Back Pain      This is an 77 year old man who presents with Back pain.  The symptoms began 10 days ago.  The patient denies fever, chills, and weakness.  The pain is located in the right low back.  The pain began at home and suddenly.  The pain is made worse by standing or walking and activity.  The pain is made better by inactivity.  Risk factors for serious underlying conditions include age >= 50 years.   has used some hydrocodone/ms relaxers (wife's meds)  All other systems reviewed and were negative   Current Medications (verified): 1)  Fosinopril Sodium 40 Mg Tabs (Fosinopril Sodium) .... Take 1 Tablet By Mouth Once A Day 2)  Lipitor 40 Mg Tabs (Atorvastatin Calcium) .... Take 1 Tablet By Mouth Once A Day 3)  Topamax 25 Mg  Tabs (Topiramate) .... Take 1 Tablet By Mouth Once A Day 4)  Aspirin 325 Mg Tabs (Aspirin) .... Once Daily 5)  Vitamin C Cr 500 Mg Cr-Tabs (Ascorbic Acid) .... Once Daily 6)  Centrum Silver  Tabs (Multiple Vitamins-Minerals) .... Once Daily 7)  Vitamin D 1000 Unit Tabs (Cholecalciferol) .... Take One By Mouth Once Daily 8)  Zyrtec Allergy 10 Mg Caps (Cetirizine Hcl) .... Take One By Mouth Once Daily  Allergies (verified): No Known Drug Allergies  Physical Exam  General:  well-developed, elderly male in no acute distress. HEENT exam atraumatic, normocephalic symmetric muscles are intact. Neck is supple. Chest clear to auscultation cardiac exam S1-S2 are regular. Musculoskeletal exam: No palpable tenderness of the back. Straight leg raise is negative. Deep tendon  reflexes at the knees are symmetric and normal.  deep tendon reflexes are absent at the ankles.   Impression & Recommendations:  Problem # 1:  BACK PAIN (ICD-724.5)  improving discussed with patient and wife I think you can acute muscle spasm. Symptoms are gradually improving. I'll treat aggressively with nonsteroidal anti-inflammatory drug, muscle relaxer and pain medication. He'll call me next week if he is not dramatically improving. His updated medication list for this problem includes:    Aspirin 325 Mg Tabs (Aspirin) ..... Once daily    Meloxicam 7.5 Mg Tabs (Meloxicam) ..... One by mouth daily with food for 14 days    Hydrocodone-acetaminophen 5-325 Mg Tabs (Hydrocodone-acetaminophen) .Marland Kitchen... 1 by mouth up to 4 times per day as needed for pain    Methocarbamol 500 Mg Tabs (Methocarbamol) .Marland Kitchen... Take 1 tablet by mouth three times a day as needed back pain  Complete Medication List: 1)  Fosinopril Sodium 40 Mg Tabs (Fosinopril sodium) .... Take 1 tablet by mouth once a day 2)  Lipitor 40 Mg Tabs (Atorvastatin calcium) .... Take 1 tablet by mouth once a day 3)  Topamax 25 Mg Tabs (Topiramate) .... Take 1 tablet by mouth once a day 4)  Aspirin 325 Mg Tabs (Aspirin) .... Once daily 5)  Vitamin C Cr 500 Mg Cr-tabs (Ascorbic acid) .... Once  daily 6)  Centrum Silver Tabs (Multiple vitamins-minerals) .... Once daily 7)  Vitamin D 1000 Unit Tabs (Cholecalciferol) .... Take one by mouth once daily 8)  Zyrtec Allergy 10 Mg Caps (Cetirizine hcl) .... Take one by mouth once daily 9)  Meloxicam 7.5 Mg Tabs (Meloxicam) .... One by mouth daily with food for 14 days 10)  Hydrocodone-acetaminophen 5-325 Mg Tabs (Hydrocodone-acetaminophen) .Marland Kitchen.. 1 by mouth up to 4 times per day as needed for pain 11)  Methocarbamol 500 Mg Tabs (Methocarbamol) .... Take 1 tablet by mouth three times a day as needed back pain  Patient Instructions: 1)  . Prescriptions: METHOCARBAMOL 500 MG TABS (METHOCARBAMOL) Take 1  tablet by mouth three times a day as needed back pain  #30 x 0   Entered and Authorized by:   Birdie Sons MD   Signed by:   Birdie Sons MD on 05/02/2010   Method used:   Electronically to        [facility] (retail)       [address]       [place], [place]  [address]       Ph: [phone] or [phone]       Fax: [phone]   RxID:   [id number] HYDROCODONE-ACETAMINOPHEN 5-325 MG TABS (HYDROCODONE-ACETAMINOPHEN) 1 by mouth up to 4 times per day as needed for pain  #30 x 0   Entered and Authorized by:   Birdie Sons MD   Signed by:   Birdie Sons MD on 05/02/2010   Method used:   Print then Give to Patient   RxID:   [id number] MELOXICAM 7.5 MG  TABS (MELOXICAM) one by mouth daily with food for 14 days  #14 x 0   Entered and Authorized by:   Birdie Sons MD   Signed by:   Birdie Sons MD on 05/02/2010   Method used:   Electronically to        [facility] (retail)       [address]       [place], [place]  [address]       Ph: [phone] or [phone]       Fax: [phone]   RxID:   [id number]    Orders Added: 1)  Est. Patient Level IV [[address]]

## 2010-07-22 NOTE — Procedures (Signed)
Summary: Colonoscopy  Patient: Darius Castro Note: All result statuses are Final unless otherwise noted.  Tests: (1) Colonoscopy (COL)   COL Colonoscopy           DONE     [facility]     [address]     [place], [place]  [address]           COLONOSCOPY PROCEDURE REPORT           PATIENT:  Darius Castro, Darius Castro  MR#:  [id number]     BIRTHDATE:  12-Apr-1924, 86 yrs. old  GENDER:  male     ENDOSCOPIST:  [name] T. Russella Dar, MD, [facility]           PROCEDURE DATE:  12/31/2009     PROCEDURE:  Colonoscopy with snare polypectomy     ASA CLASS:  Class II     INDICATIONS:  1) follow-up of polyp  2) surveillance and high-risk     screening, adenomatous ascending colon polyp removed piecemeal in     08/2007 and 10/2008.     MEDICATIONS:   Fentanyl 50 mcg IV, Versed 7 mg IV     DESCRIPTION OF PROCEDURE:   After the risks benefits and     alternatives of the procedure were thoroughly explained, informed     consent was obtained.  Digital rectal exam was performed and     revealed no abnormalities.   The LB PCF-H180AL [id number] endoscope     was introduced through the anus and advanced to the cecum, which     was identified by both the appendix and ileocecal valve, limited     by a tortuous and redundant colon, fair prep.    The quality of     the prep was Moviprep fair.  The instrument was then slowly     withdrawn as the colon was fully examined.     <<PROCEDUREIMAGES>>     FINDINGS:  A sessile polyp was found in the ascending colon. It     was 6 mm in size. at site of prior polypectomy with adjacent     tattoos noted. Polyp was snared, then cauterized with monopolar     cautery. Retrieval was successful. The polyp was completely     removed. A normal appearing cecum, ileocecal valve, and     appendiceal orifice were identified. The hepatic flexure,     transverse, splenic flexure, descending, sigmoid colon, and rectum     appeared unremarkable. Retroflexed views in the rectum revealed     internal  hemorrhoids, small.  The time to cecum =  8  minutes. The     scope was then withdrawn (time =  13.33  min) from the patient and     the procedure completed.           COMPLICATIONS:  None           ENDOSCOPIC IMPRESSION:     1) 6 mm sessile polyp in the ascending colon     2) Internal hemorrhoids           RECOMMENDATIONS:     1) No aspirin or NSAID's for 2 weeks     2) Await pathology results     3) OP follow-up is advised on a PRN basis.     4) Given complete polypectomy today and patients age will not plan     for future surveillance colonoscopies           Nayzeth Altman T.  Russella Dar, MD, [facility]           CC: Lindley Magnus, MD           n.     Rosalie DoctorVenita Lick. Gayanne Prescott at 12/31/2009 04:42 PM           Zara Chess, [id number]  Note: An exclamation mark (!) indicates a result that was not dispersed into the flowsheet. Document Creation Date: 12/31/2009 4:43 PM _______________________________________________________________________  (1) Order result status: Final Collection or observation date-time: 12/31/2009 16:30 Requested date-time:  Receipt date-time:  Reported date-time:  Referring Physician:   Ordering Physician: Claudette Head [phone]) Specimen Source:  Source: Launa Grill Order Number: [id number] Lab site:

## 2010-07-22 NOTE — Medication Information (Signed)
Summary: Possible Drug Interaction/[facility]  Possible Drug Interaction/[facility]   Imported By: Maryln Gottron 07/04/2009 14:18:00  _____________________________________________________________________  External Attachment:    Type:   Image     Comment:   External Document

## 2010-07-22 NOTE — Letter (Signed)
Summary: [facility] Instructions  [facility]  [address] [place], [place] [address]   Phone: [phone]  Fax: [phone]       Darius Castro    11/19/1923    MRN: [id number]        Procedure Day /Date: Tuesday July 12th, 2011     Arrival Time: 3:00pm     Procedure Time: 4:00pm     Location of Procedure:                    _x _  [facility] (4th Floor)                        PREPARATION FOR COLONOSCOPY WITH MOVIPREP   Starting 5 days prior to your procedure 12/26/09 do not eat nuts, seeds, popcorn, corn, beans, peas,  salads, or any raw vegetables.  Do not take any fiber supplements (e.g. Metamucil, Citrucel, and Benefiber).  THE DAY BEFORE YOUR PROCEDURE         DATE: 12/30/09  DAY: Monday  1.  Drink clear liquids the entire day-NO SOLID FOOD  2.  Do not drink anything colored red or purple.  Avoid juices with pulp.  No orange juice.  3.  Drink at least 64 oz. (8 glasses) of fluid/clear liquids during the day to prevent dehydration and help the prep work efficiently.  CLEAR LIQUIDS INCLUDE: Water Jello Ice Popsicles Tea (sugar ok, no milk/cream) Powdered fruit flavored drinks Coffee (sugar ok, no milk/cream) Gatorade Juice: apple, white grape, white cranberry  Lemonade Clear bullion, consomm, broth Carbonated beverages (any kind) Strained chicken noodle soup Hard Candy                             4.  In the morning, mix first dose of MoviPrep solution:    Empty 1 Pouch A and 1 Pouch B into the disposable container    Add lukewarm drinking water to the top line of the container. Mix to dissolve    Refrigerate (mixed solution should be used within 24 hrs)  5.  Begin drinking the prep at 5:00 p.m. The MoviPrep container is divided by 4 marks.   Every 15 minutes drink the solution Castro to the next mark (approximately 8 oz) until the full liter is complete.   6.  Follow completed prep with 16 oz of clear liquid of your choice (Nothing  red or purple).  Continue to drink clear liquids until bedtime.  7.  Before going to bed, mix second dose of MoviPrep solution:    Empty 1 Pouch A and 1 Pouch B into the disposable container    Add lukewarm drinking water to the top line of the container. Mix to dissolve    Refrigerate  THE DAY OF YOUR PROCEDURE      DATE: 12/31/09 DAY: Tuesday  Beginning at 11:00 a.m. (5 hours before procedure):         1. Every 15 minutes, drink the solution Castro to the next mark (approx 8 oz) until the full liter is complete.  2. Follow completed prep with 16 oz. of clear liquid of your choice.    3. You may drink clear liquids until 2:00pm (2 HOURS BEFORE PROCEDURE).   MEDICATION INSTRUCTIONS  Unless otherwise instructed, you should take regular prescription medications with a small sip of water   as early as possible the morning of your  procedure.         OTHER INSTRUCTIONS  You will need a responsible adult at least 75 years of age to accompany you and drive you home.   This person must remain in the waiting room during your procedure.  Wear loose fitting clothing that is easily removed.  Leave jewelry and other valuables at home.  However, you may wish to bring a book to read or  an iPod/MP3 player to listen to music as you wait for your procedure to start.  Remove all body piercing jewelry and leave at home.  Total time from sign-in until discharge is approximately 2-3 hours.  You should go home directly after your procedure and rest.  You can resume normal activities the  day after your procedure.  The day of your procedure you should not:   Drive   Make legal decisions   Operate machinery   Drink alcohol   Return to work  You will receive specific instructions about eating, activities and medications before you leave.    The above instructions have been reviewed and explained to me by   Marchelle Folks.     I fully understand and can verbalize these instructions  _____________________________ Date _________

## 2010-07-22 NOTE — Letter (Signed)
Summary: Patient Notice- Polyp Results  [facility]  [address] [place], [place] [address]   Phone: [phone]  Fax: [phone]        January 03, 2010 MRN: [id number]    [facility] [address] [place], [place]  [address]    Dear Mr. Bohall,  I am pleased to inform you that the colon polyp(s) removed during your recent colonoscopy was (were) found to be benign (no cancer detected) upon pathologic examination.  Should you develop new or worsening symptoms of abdominal pain, bowel habit changes or bleeding from the rectum or bowels, please schedule an evaluation with either your primary care physician or with me.  No further action with gastroenterology is needed at this time. Please      follow-up with your primary care physician for your other healthcare      needs.  Continue treatment plan as outlined the day of your exam.  Please call us if you are having persistent problems or have questions about your condition that have not been fully answered at this time.  Sincerely,  Meryl Dare MD [facility]  This letter has been electronically signed by your physician.  Appended Document: Patient Notice- Polyp Results letter mailed.

## 2010-10-01 ENCOUNTER — Encounter: Payer: Self-pay | Admitting: Internal Medicine

## 2010-10-03 ENCOUNTER — Ambulatory Visit (INDEPENDENT_AMBULATORY_CARE_PROVIDER_SITE_OTHER): Payer: Medicare Other | Admitting: Internal Medicine

## 2010-10-03 ENCOUNTER — Encounter: Payer: Self-pay | Admitting: Internal Medicine

## 2010-10-03 DIAGNOSIS — N419 Inflammatory disease of prostate, unspecified: Secondary | ICD-10-CM

## 2010-10-03 DIAGNOSIS — Z8546 Personal history of malignant neoplasm of prostate: Secondary | ICD-10-CM

## 2010-10-03 DIAGNOSIS — Z0000 Encounter for general adult medical examination without abnormal findings: Secondary | ICD-10-CM

## 2010-10-03 DIAGNOSIS — I10 Essential (primary) hypertension: Secondary | ICD-10-CM

## 2010-10-03 DIAGNOSIS — E785 Hyperlipidemia, unspecified: Secondary | ICD-10-CM

## 2010-10-03 DIAGNOSIS — E119 Type 2 diabetes mellitus without complications: Secondary | ICD-10-CM

## 2010-10-03 LAB — BASIC METABOLIC PANEL
BUN: 21 mg/dL (ref 6–23)
Calcium: 8.9 mg/dL (ref 8.4–10.5)
GFR: 69.81 mL/min (ref >60.00)
Glucose, Bld: 91 mg/dL (ref 70–99)
Potassium: 4.1 mEq/L (ref 3.5–5.1)

## 2010-10-03 LAB — HEPATIC FUNCTION PANEL
ALT: 18 U/L (ref 0–53)
Bilirubin, Direct: 0.2 mg/dL (ref 0.0–0.3)
Total Bilirubin: 1.1 mg/dL (ref 0.3–1.2)

## 2010-10-03 LAB — PSA: PSA: 0.06 ng/mL — ABNORMAL LOW (ref 0.10–4.00)

## 2010-10-03 LAB — LIPID PANEL: VLDL: 12.8 mg/dL (ref 0.0–40.0)

## 2010-10-03 NOTE — Assessment & Plan Note (Signed)
Not as well-controlled as I would like her best to monitor his blood pressure at home. Goal blood pressure less than 130/80. He may need medication adjustment. See me back in one month.

## 2010-10-03 NOTE — Progress Notes (Signed)
Addended by: Rossie Muskrat on: 10/03/2010 09:39 AM   Modules accepted: Orders

## 2010-10-03 NOTE — Assessment & Plan Note (Signed)
Blood sugars have been elevated in the past. All check laboratory work today.

## 2010-10-03 NOTE — Assessment & Plan Note (Signed)
Needs followup laboratory work. Will be done today.

## 2010-10-03 NOTE — Progress Notes (Signed)
Subjective:    Darius Castro is a 77 y.o. male who presents for Medicare Initial preventive examination.   Preventive Screening-Counseling & Management  Tobacco History  Smoking status  . Former Smoker  . Quit date: 06/22/1961  Smokeless tobacco  . Not on file    Problems Prior to Visit 1.   Current Problems (verified) Patient Active Problem List  Diagnoses  . DIABETES MELLITUS, TYPE II  . HYPERLIPIDEMIA  . HYPERTENSION  . BACK PAIN  . PROSTATE CANCER, HX OF  . COLONIC POLYPS, HX OF    Medications Prior to Visit Current Outpatient Prescriptions on File Prior to Visit  Medication Sig Dispense Refill  . Ascorbic Acid (VITAMIN C) 500 MG tablet Take 500 mg by mouth daily.        Marland Kitchen aspirin 325 MG tablet Take 325 mg by mouth daily.        Marland Kitchen atorvastatin (LIPITOR) 40 MG tablet Take 40 mg by mouth daily.        . cetirizine (ZYRTEC) 10 MG tablet Take 10 mg by mouth daily.        . cholecalciferol (VITAMIN D) 1000 UNITS tablet Take 1,000 Units by mouth daily.        . fosinopril (MONOPRIL) 40 MG tablet Take 40 mg by mouth daily.        Marland Kitchen topiramate (TOPAMAX) 25 MG tablet Take 25 mg by mouth daily.        Marland Kitchen HYDROcodone-acetaminophen (NORCO) 5-325 MG per tablet Take 1 tablet by mouth 4 (four) times daily.        . meloxicam (MOBIC) 7.5 MG tablet Take 7.5 mg by mouth daily.        . methocarbamol (ROBAXIN) 500 MG tablet Take 500 mg by mouth 3 (three) times daily.        . Multiple Vitamin (MULTIVITAMIN) tablet Take 1 tablet by mouth daily.          Current Medications (verified) Current Outpatient Prescriptions  Medication Sig Dispense Refill  . Ascorbic Acid (VITAMIN C) 500 MG tablet Take 500 mg by mouth daily.        Marland Kitchen aspirin 325 MG tablet Take 325 mg by mouth daily.        Marland Kitchen atorvastatin (LIPITOR) 40 MG tablet Take 40 mg by mouth daily.        . Biotin 5000 MCG CAPS Take 1 capsule by mouth daily.        . cetirizine (ZYRTEC) 10 MG tablet Take 10 mg by mouth daily.          . cholecalciferol (VITAMIN D) 1000 UNITS tablet Take 1,000 Units by mouth daily.        Marland Kitchen co-enzyme Q-10 30 MG capsule Take 30 mg by mouth daily.        . fosinopril (MONOPRIL) 40 MG tablet Take 40 mg by mouth daily.        Marland Kitchen topiramate (TOPAMAX) 25 MG tablet Take 25 mg by mouth daily.        Marland Kitchen HYDROcodone-acetaminophen (NORCO) 5-325 MG per tablet Take 1 tablet by mouth 4 (four) times daily.        . meloxicam (MOBIC) 7.5 MG tablet Take 7.5 mg by mouth daily.        . methocarbamol (ROBAXIN) 500 MG tablet Take 500 mg by mouth 3 (three) times daily.        . Multiple Vitamin (MULTIVITAMIN) tablet Take 1 tablet by mouth daily.  Allergies (verified) Review of patient's allergies indicates no known allergies.   PAST HISTORY  Family History Family History  Problem Relation Age of Onset  . Heart disease Father   . Heart attack Father   . Colon polyps Daughter   . Colon cancer Neg Hx   . Hypertension Mother     Social History History  Substance Use Topics  . Smoking status: Former Smoker    Quit date: 06/22/1961  . Smokeless tobacco: Not on file  . Alcohol Use: No    Are there smokers in your home (other than you)?  No  Risk Factors Current exercise habits: The patient does not participate in regular exercise at present. but he does walk regularly Dietary issues discussed: he follows a good diet   Cardiac risk factors: advanced age (older than 22 for men, 31 for women), dyslipidemia, hypertension and male gender.  Depression Screen (Note: if answer to either of the following is "Yes", a more complete depression screening is indicated)   Q1: Over the past two weeks, have you felt down, depressed or hopeless? No  Q2: Over the past two weeks, have you felt little interest or pleasure in doing things? No  Have you lost interest or pleasure in daily life? No  Do you often feel hopeless? No  Do you cry easily over simple problems? No  Activities of Daily Living In  your present state of health, do you have any difficulty performing the following activities?:  Driving? yes Managing money?  Yes Feeding yourself? Yes Getting from bed to chair? Yes Climbing a flight of stairs? Yes Preparing food and eating?:  Lives at friend's home---also cooks for himself Bathing or showering? No Getting dressed: No Getting to the toilet? No Using the toilet:No Moving around from place to place: No In the past year have you fallen or had a near fall?:No   Are you sexually active?  No  Do you have more than one partner?  No  Hearing Difficulties: No--hearing aids occasional use   Do you feel that you have a problem with memory? No  Do you often misplace items? No  Do you feel safe at home?  Yes  Cognitive Testing  Alert? Yes  Normal Appearance?Yes  Oriented to person? Yes  Place? Yes   Time? Yes  Recall of three objects?  Yes  Can perform simple calculations? Yes  Displays appropriate judgment?Yes  Can read the correct time from a watch face?Yes   Advanced Directives have been discussed with the patient? Yes   Immunization History  Administered Date(s) Administered  . Influenza Whole 04/01/2007, 04/03/2008, 03/29/2009, 03/21/2010  . Pneumococcal Polysaccharide 02/05/2003, 08/29/2009  . Td 08/29/2009  . Zoster 01/28/2006    Screening Tests Health Maintenance  Topic Date Due  . Influenza Vaccine  03/23/2011  . Colonoscopy  11/08/2018  . Tetanus/tdap  08/30/2019  . Pneumococcal Polysaccharide Vaccine Age 49 And Over  Completed  . Zostavax  Completed       History reviewed: allergies, current medications, past family history, past medical history, past social history, past surgical history and problem list  Review of Systems   patient denies chest pain, shortness of breath, orthopnea. Denies lower extremity edema, abdominal pain, change in appetite, change in bowel movements. Patient denies rashes, musculoskeletal complaints. No other specific  complaints in a complete review of systems.    Objective:     Vision by Snellen chart: right eye:20/20, left eye:20/20 uses reading glasses Blood pressure 162/96, pulse  68, temperature 98.6 F (37 C), temperature source Oral, height 5' 7.5" (1.715 m), weight 167 lb (75.751 kg). Body mass index is 25.77 kg/(m^2).  Well-developed male in no acute distress. HEENT exam atraumatic, normocephalic, extraocular muscles are intact. Conjunctivae are pink without exudate. Neck is supple without lymphadenopathy, thyromegaly, jugular venous distention. Chest is clear to auscultation without increased work of breathing. Cardiac exam S1-S2 are regular. The PMI is normal. No significant murmurs or gallops. Abdominal exam active bowel sounds, soft, nontender. No abdominal bruits. Extremities no clubbing cyanosis or edema. Peripheral pulses are normal without bruits. Neurologic exam alert and oriented without any motor or sensory deficits.       Assessment:     Wellness visit     Plan:     During the course of the visit the patient was educated and counseled about appropriate screening and preventive services including:      Patient Instructions (the written plan) was given to the patient.  Medicare Attestation I have personally reviewed: The patient's medical and social history Their use of alcohol, tobacco or illicit drugs Their current medications and supplements The patient's functional ability including ADLs,fall risks, home safety risks, cognitive, and hearing and visual impairment Diet and physical activities Evidence for depression or mood disorders  The patient's weight, height, BMI, and visual acuity have been recorded in the chart.  I have made referrals, counseling, and provided education to the patient based on review of the above and I have provided the patient with a written personalized care plan for preventive services.     [name], MD   10/03/2010

## 2010-10-03 NOTE — Assessment & Plan Note (Signed)
Check PSA today. 

## 2010-10-06 LAB — URINE CULTURE: Colony Count: NO GROWTH

## 2010-10-06 LAB — COMPREHENSIVE METABOLIC PANEL
ALT: 20 U/L (ref 0–53)
Alkaline Phosphatase: 53 U/L (ref 39–117)
BUN: 23 mg/dL (ref 6–23)
CO2: 26 mEq/L (ref 19–32)
Chloride: 106 mEq/L (ref 96–112)
Glucose, Bld: 123 mg/dL — ABNORMAL HIGH (ref 70–99)
Potassium: 4.5 mEq/L (ref 3.5–5.1)
Sodium: 139 mEq/L (ref 135–145)
Total Bilirubin: 0.9 mg/dL (ref 0.3–1.2)

## 2010-10-06 LAB — URINALYSIS, ROUTINE W REFLEX MICROSCOPIC
Bilirubin Urine: NEGATIVE
Hgb urine dipstick: NEGATIVE
Ketones, ur: NEGATIVE mg/dL
Nitrite: NEGATIVE
Specific Gravity, Urine: 1.015 (ref 1.005–1.030)
Urobilinogen, UA: 1 mg/dL (ref 0.0–1.0)
pH: 6.5 (ref 5.0–8.0)

## 2010-10-06 LAB — DIFFERENTIAL
Basophils Absolute: 0 10*3/uL (ref 0.0–0.1)
Basophils Relative: 0 % (ref 0–1)
Eosinophils Absolute: 0 10*3/uL (ref 0.0–0.7)
Monocytes Relative: 7 % (ref 3–12)
Neutro Abs: 4.8 10*3/uL (ref 1.7–7.7)
Neutrophils Relative %: 72 % (ref 43–77)

## 2010-10-06 LAB — CBC
HCT: 40.6 % (ref 39.0–52.0)
Hemoglobin: 13.4 g/dL (ref 13.0–17.0)
RBC: 4.22 MIL/uL (ref 4.22–5.81)
WBC: 6.6 10*3/uL (ref 4.0–10.5)

## 2010-10-06 LAB — APTT: aPTT: 24 seconds (ref 24–37)

## 2010-10-06 LAB — PROTIME-INR: INR: 1 (ref 0.00–1.49)

## 2010-10-07 LAB — BASIC METABOLIC PANEL
Calcium: 8 mg/dL — ABNORMAL LOW (ref 8.4–10.5)
Creatinine, Ser: 1.07 mg/dL (ref 0.4–1.5)
GFR calc Af Amer: >60 mL/min (ref >60)
Sodium: 135 mEq/L (ref 135–145)

## 2010-10-07 LAB — CBC
Hemoglobin: 12.1 g/dL — ABNORMAL LOW (ref 13.0–17.0)
RBC: 3.72 MIL/uL — ABNORMAL LOW (ref 4.22–5.81)
WBC: 8 10*3/uL (ref 4.0–10.5)

## 2010-11-04 NOTE — Op Note (Signed)
NAME:  Darius Castro, Darius Castro                  ACCOUNT NO.:  [id number]   MEDICAL RECORD NO.:  [id number]          PATIENT TYPE:  OIB   LOCATION:  3308                         FACILITY:  [facility]   PHYSICIAN:  Robert A. Thurston Hole, M.D. DATE OF BIRTH:  18-Jul-1923   DATE OF PROCEDURE:  07/25/2008  DATE OF DISCHARGE:  07/25/2008                               OPERATIVE REPORT   PREOPERATIVE DIAGNOSES:  1. Right shoulder rotator cuff tear.  2. Right shoulder chronic biceps tendon rupture.  3. Right shoulder partial labrum tear.  4. Right shoulder impingement.  5. Right shoulder acromioclavicular joint degenerative joint disease      and spurring.   POSTOPERATIVE DIAGNOSES:  1. Right shoulder rotator cuff tear.  2. Right shoulder chronic biceps tendon rupture.  3. Right shoulder partial labrum tear.  4. Right shoulder impingement.  5. Right shoulder acromioclavicular joint degenerative joint disease      and spurring.   PROCEDURES:  1. Right shoulder examination under anesthesia followed by      arthroscopically assisted rotator cuff repair using Arthrex suture      anchors x2 plus push lock anchors x2.  2. Right shoulder debridement, partial labrum tear.  3. Right shoulder subacromial decompression.  4. Right shoulder distal clavicle excision.   SURGEON:  Elana Alm. Thurston Hole, MD   ASSISTANT:  Julien Girt, PA   ANESTHESIA:  General.   OPERATIVE TIME:  1 hour and 45 minutes.   COMPLICATIONS:  None.   DESCRIPTION OF PROCEDURE:  Darius Castro was brought to the operating room on  July 24, 2008, after interscalene block, he was placed in holding by  anesthesia.  He was placed on the operative table in supine position.  He received Ancef 1 g IV preoperatively for prophylaxis.  After being  placed under general anesthesia, his right shoulder was examined.  He  had full range of motion in his shoulder with stable ligamentous exam.  He was then placed in a beach chair position and his shoulder  and arm  was prepped using sterile DuraPrep and draped using sterile technique.  Originally, through a posterior arthroscopic portal, the arthroscope  with a pump attached and was placed into an anterior portal and  arthroscopic probe was placed.  On initial inspection, the articular  cartilage and a glenohumeral joint showed grade 1 and 2 chondromalacia.  The biceps tendon was found to be chronically ruptured and was  nonexistent in the articular portion of the joint.  The anterior,  posterior, and superior labrum showed mild fraying, which was debrided.  The anterior inferior labrum and anterior inferior glenohumeral ligament  complex was intact.  Superior labrum had moderate fraying, which was  debrided.  The rotator cuff showed a complete tear of the supraspinatus  and of the infraspinatus and this was partially debrided  arthroscopically.  The subscapularis and teres minor were intact.  Inferior capsular recess was free of pathology.  Subacromial space was  then entered and a lateral arthroscopic portal was made.  Moderately  thickened bursitis was resected.  Impingement was noted and a  subacromial  decompression was carried out removing 6-8 mm of the  undersurface of the anterior, anterolateral, anteromedial acromion, and  CA ligament release carried out as well.  The [facility] joint showed  significant spurring and degenerative changes and the distal 6 mm of  clavicle was resected with a 6-mm bur.  After this was done, through an  accessory lateral portal.  Two separate Arthrex 5.5-mm suture anchors  were placed in a greater tuberosity.  Each of these anchors had 2  sutures in them and each of the sutures were passed arthroscopically  through the tears of the rotator cuff and then tied down tightly with a  firm and tight repair.  This repair was further augmented with 2  separate Arthrex 4.5-mm push-lock anchors in the greater tuberosity thus  further securing the sutures in place with a  firm and tight repair.  After this was done, the shoulder could be brought through a full range  of motion with no impingement on the repair.  At this point, I felt that  all pathology had been satisfactorily addressed.  The instruments were  removed.  Portals were closed with 3-0 nylon suture.  Sterile dressings  and a sling applied and the patient awakened and taken to recovery room  in stable condition.   FOLLOWUP CARE:  Darius Castro will be followed overnight for observation due  to multiple medical conditions.  He will be discharged tomorrow if  stable.  He will be seen back in my office in a week for sutures out and  follow up.      Robert A. Thurston Hole, M.D.  Electronically Signed     RAW/MEDQ  D:  07/25/2008  T:  07/25/2008  Job:  [id number]

## 2010-11-04 NOTE — Assessment & Plan Note (Signed)
[facility]                         GASTROENTEROLOGY OFFICE NOTE   Darius Castro, Darius Castro                         MRN:          [id number]  DATE:08/19/2007                            DOB:          Nov 06, 1923    REFERRING PHYSICIAN:  Valetta Mole. Swords, MD   REASON FOR CONSULTATION:  Family history of colon polyps and a personal  history of prostate cancer.   HISTORY OF PRESENT ILLNESS:  Darius Castro is an 77 year old white male, who  has no ongoing colorectal complaints.  His daughter was diagnosed with  precancerous colon polyps in her thirties.  The patient was diagnosed  with prostate cancer and is status post radiation seed implantation  approximately two years ago.  He has had a prior history of hemorrhoids,  but they have not been active in many years and he has rare episodes of  heartburn, related to certain foods.  He has no abdominal pain, rectal  pain, change in bowel habits, change in stool caliber, melena,  hematochezia or weight-loss.  No other family members with colon polyps,  other than his daughter.  No family members with colon cancer or  inflammatory bowel disease.  He states he has had multiple  sigmoidoscopies over the years and, for several years, he states he had  a flexible sigmoidoscopy performed every year.  He thinks his last  sigmoidoscopy may have been by Dr. Boyd Kerbs or Dr. Shonna Chock  in 2000 or 2002.   PAST MEDICAL HISTORY:  History of prostate cancer  hypertension  hyperlipidemia  diabetes mellitus - type 2  arthritis  sleep apnea   PAST SURGICAL HISTORY:  Status post right-knee surgery  status post lumbar laminectomy   CURRENT MEDICATIONS:  Listed on the chart, updated and reviewed.   MEDICATION ALLERGIES:  None known.   SOCIAL HISTORY:  Per the handwritten form.   REVIEW OF SYSTEMS:  Per the handwritten form.   PHYSICAL EXAM:  Well-developed, well-nourished, white male, in no acute  distress.  Height 5  feet 10 inches, weight 178.4 pounds.  Blood pressure is 140/80,  pulse 73 and regular.  HEENT EXAM:  Anicteric sclerae.  Oropharynx clear.  CHEST:  Clear to auscultation bilaterally.  CARDIAC:  Regular rate and rhythm without murmurs appreciated.  ABDOMEN:  Soft, nontender, nondistended.  Normoactive bowel sounds, no  palpable organomegaly, masses or hernias.  RECTAL EXAMINATION:  Deferred to time of colonoscopy.  EXTREMITIES:  Without clubbing, cyanosis or edema.  NEUROLOGIC:  Alert and oriented times three, grossly nonfocal.   ASSESSMENT AND PLAN:  Family history of colon polyps-daughter with colon  polyps in her thirties.  Personal history of prostate cancer.  Risks,  benefits, and alternatives to colonoscopy with possible biopsy and  possible polypectomy discussed with the patient.  He consents to  proceed.  This will be scheduled electively.  If he has no polyps or  only a few small polyps, we will not plan for future colonoscopies.     Venita Lick. Russella Dar, MD, [facility]  Electronically Signed    MTS/MedQ  DD: 08/19/2007  DT: 08/19/2007  Job #: [id number]

## 2010-11-07 NOTE — Discharge Summary (Signed)
NAME:  Darius Castro, Darius Castro                  ACCOUNT NO.:  [id number]   MEDICAL RECORD NO.:  [id number]          PATIENT TYPE:  INP   LOCATION:  2013                         FACILITY:  [facility]   PHYSICIAN:  Thomos Lemons, D.O. [facility]   DATE OF BIRTH:  May 21, 1924   DATE OF ADMISSION:  04/03/2004  DATE OF DISCHARGE:  04/04/2004                                 DISCHARGE SUMMARY   DISCHARGE DIAGNOSES:  1.  Chest pain.  2.  Myocardial infarction, ruled out.  3.  Pulmonary embolus, ruled out.  4.  Aortic dissection, ruled out.  5.  Musculoskeletal pain.  6.  Hepatic cysts, renal cysts and L5 pars defect.   HISTORY OF PRESENT ILLNESS:  Mr. Methot is a 77 year old, white male who was  seen in the office by Dr. Cato Mulligan on April 03, 2004, with complaints of mid  scapular back pain x1 week.  The night prior to admission, pain had radiated  to his chest and down his left arm.  His left arm felt tight and his fifth  finger was aching.  He denied any weakness.  He described the chest pain as  dull and mild, but enough to wake him up.   PAST MEDICAL HISTORY:  1.  Hypertension.  2.  Hyperlipidemia.  3.  Diet-controlled diabetes.   HOSPITAL COURSE:  Problem 1.  CARDIOVASCULAR:  The patient was admitted with  chest pain to rule out myocardial infarction.  Serial cardiac enzymes were  negative.  EKG was without ischemia, although it did reveal some sinus  bradycardia with first-degree AV block.  The patient had a treadmill  Cardiolite in March 2005, that was negative for ischemia.  Therefore, it is  not felt further cardiac workup was indicated.  The patient also had a CT of  the chest which was negative for PE or aortic dissection.  The patient is  tender on palpation between his shoulder blades.  He denies any recent  trauma, but we suspect his pain is more likely musculoskeletal than cardiac  or pulmonary etiology.  The patient was getting relief from heating pad and  Advil at home.  We will have him continue  that treatment for another 3-4  days.   Problem 2.  HYPERTENSION:  His blood pressure remained stable.   DISCHARGE LABORATORY DATA AND X-RAY FINDINGS:  CBC, BMET, coagulations and  LFTs were normal.  Serial cardiac enzymes were negative.   CT of the chest was negative for PE or aortic dissection.  It did reveal  some scattered subsegmental atelectasis.  There is a hepatic cysts, renal  cysts and L5 pars defect with grade 1 anterior lithiasis.   DISCHARGE MEDICATIONS:  1.  Hydrochlorothiazide 12.5 mg q.d.  2.  Lisinopril 10 mg q.d.  3.  Lipitor 40 mg q.d.  4.  Aspirin 325 mg q.d.  5.  Naprosyn 500 mg b.i.d. x4 days.   FOLLOW UP:  Follow up with Dr. Cato Mulligan next week.  This was discussed with  Dr. Cato Mulligan prior to discharge.       LC/MEDQ  D:  04/04/2004  T:  04/04/2004  Job:  [id number]

## 2010-11-07 NOTE — Op Note (Signed)
NAME:  Darius Castro, Darius Castro                  ACCOUNT NO.:  [id number]   MEDICAL RECORD NO.:  [id number]          PATIENT TYPE:  AMB   LOCATION:  [facility]                         FACILITY:  [facility]   PHYSICIAN:  Courtney Paris, M.D.DATE OF BIRTH:  Jul 30, 1923   DATE OF PROCEDURE:  08/11/2006  DATE OF DISCHARGE:                               OPERATIVE REPORT   PREOPERATIVE DIAGNOSIS:  T2b Gleason 3 + 3 adenocarcinoma of the  prostate.   POSTOPERATIVE DIAGNOSIS:  T2b Gleason 3 + 3 adenocarcinoma of the  prostate.   OPERATION:  Brachytherapy of the prostate, cysto.   ANESTHESIA:  General.   SURGEONS:  Courtney Paris, M.D./Robert Elson Areas, M.D.   BRIEF HISTORY:  This 77 year old patient was admitted with a clinical  T2b Gleason 3 + 3 adenocarcinoma of the prostate.  PSA was elevated 5.7  in June and negative bone scan and he was admitted for brachytherapy.  He had some obstructive symptoms, but on maximal medical therapy his  IPPS has dropped to least 6 or 7 points.  He enters for definitive  operating therapy for his prostate at this time.   The patient was placed on the operating table in the dorsal lithotomy  position after satisfactory induction of general endotracheal  anesthesia, was prepped and draped with Betadine in the usual sterile  fashion.  Foley catheter and rectal tube was inserted as well as the 7.5  MHz ultrasound probe in the rectum.  The brachytherapy planning film was  then done on the table and the seeds were then delivered by the  Nucletron.  He had a total of 27 needles with 75 seeds of RA125.  When  the seeds were all placed, the catheters were then removed and a  flexible cystoscope was then used to inspect his bladder for any seeds  that might be present.  The anterior urethra showed no obstruction  except at the prostate where he had trilobar hyperplasia, a little bit  of hyperemia and the bladder was entered.  It was 2+ trabeculated with  some early cellule  formation but no seeds were seen.  There was a little  bit of hyperemia of the prostate, but again in the seeds had penetrated  the bladder.  Scope was then removed.  A #16 Foley catheter was  inserted.  The patient taken to the recovery room in good condition with  detailed written instructions.  He will have his catheter removed in a  day and a half.  We will maintain him on maximal medical therapy with  finasteride of Flomax until I see him in 3 weeks.      Courtney Paris, M.D.  Electronically Signed     HMK/MEDQ  D:  08/11/2006  T:  08/11/2006  Job:  [id number]

## 2010-11-07 NOTE — H&P (Signed)
NAME:  Appelt, Steffan                  ACCOUNT NO.:  [id number]   MEDICAL RECORD NO.:  [id number]          PATIENT TYPE:  INP   LOCATION:  2013                         FACILITY:  [facility]   PHYSICIAN:  Valetta Mole. Swords, M.D. [facility] OF BIRTH:  August 11, 1923   DATE OF ADMISSION:  04/03/2004  DATE OF DISCHARGE:                                HISTORY & PHYSICAL   CHIEF COMPLAINT:  Chest and back pain.   Mr. Doten is a 77 year old male who developed mid scapular back pain  approximately one week ago. At that there was no known trauma contributing  to the discomfort and the pain seemed muscular in nature. Last night he felt  the pain radiate to his chest and to his right arm. He felt like his right  arm was tingling and his 5th finger went asleep. He denies any right upper  extremity weakness. The chest pain he describes as dull, but mild, but it  was enough discomfort to wake him up. He denies any associated reflux  disease, shortness of breath, nausea, vomiting, or diaphoresis. He describes  the mid scapular back pain as more painful on the right side, 5/10. It is  worth noting that he had a stress Cardiolite in March 2005 which was normal.  He does have multiple risk factors for coronary artery disease including  hypertension, hyperlipidemia, type 2 diabetes (diet controlled), and he is a  male over the age of 89.   PAST MEDICAL HISTORY:  Significant for hypertension, dyslipidemia, diabetes  (diet controlled).   PAST SURGICAL HISTORY:  Tonsillectomy, lumbar laminectomy in 1988, and a  right knee arthroscopy.   SOCIAL HISTORY:  He is married. He is a remote smoker.   FAMILY HISTORY:  Mother died at age 48; only problem was high blood  pressure. Father died of a myocardial infarction at the age of 83.   REVIEW OF SYSTEMS:  He denies shortness of breath, PND, abdominal pain,  change in bowel movements, urinary habits, rashes, muscular weakness,  neurologic deficits, or any other complaints in  the review of systems.   MEDICATIONS:  He takes Lipitor 40 mg p.o. daily, fosinopril 10 mg p.o.  daily, hydrochlorothiazide 12.5 mg p.o. daily, aspirin 325 mg p.o. daily.   ALLERGIES:  No known drug allergies.   PHYSICAL EXAMINATION:  VITAL SIGNS: Weight 168, temperature 97.6, pulse 68,  respirations 14, blood pressure 140/60.  GENERAL: A well-developed, well-nourished male in no acute distress.  HEENT: Normocephalic and atraumatic. Extraocular movements intact.  NECK: Supple without lymphadenopathy, thyromegaly, jugular venous  distention, or carotid bruits.  CHEST: Clear to auscultation without increased work of breathing.  CARDIAC: Normal without murmurs, rubs, or gallops.  ABDOMEN: Active bowel sounds, soft, and nontender. There is no  hepatosplenomegaly. No masses palpated.  EXTREMITIES: There is no clubbing, cyanosis, or edema.  NEUROLOGIC: He is alert and oriented without any motor or sensory deficits.  Not able to elicit any chest discomfort by palpation.  PERIPHERAL PULSES: Femoral, popliteal, and dorsalis pedis pulses are normal.  Radial pulses are normal. Right arm blood pressure is 140/60  and left arm  blood pressure is 142/64.   EKG was done in the office demonstrates normal sinus rhythm, essentially  normal EKG.   ASSESSMENT/PLAN:  Chest discomfort with mid scapular pain. I do not think  this is cardiac in nature, but I am concerned and he needs to be  hospitalized to rule out myocardial infarction. Other diagnoses that need to  be further evaluated include the possibility of an aortic dissection given  his history of hyperlipidemia and hypertension. We will get a CT scan of the  chest, PE protocol, to rule out a PE and aortic dissection. Otherwise, will  continue the same medications.       BHS/MEDQ  D:  04/04/2004  T:  04/04/2004  Job:  [id number]

## 2010-11-07 NOTE — Procedures (Signed)
NAME:  Darius Castro, Darius Castro                  ACCOUNT NO.:  [id number]   MEDICAL RECORD NO.:  [id number]          PATIENT TYPE:  OUT   LOCATION:  SLEEP CENTER                 FACILITY:  [facility]   PHYSICIAN:  Marcelyn Bruins, M.D. [facility] DATE OF BIRTH:  Apr 05, 1924   DATE OF STUDY:  03/30/2004                              NOCTURNAL POLYSOMNOGRAM   REFERRING PHYSICIAN:  Bruce H. Swords, M.D.   INDICATION FOR THE STUDY:  Hypersomnia with sleep apnea.   EPWORTH SLEEPINESS SCORE:  3   SLEEP ARCHITECTURE:  Patient had a total sleep time of 314 minutes however,  never achieved REM or slow wave sleep.  Sleep onset latency was normal.  Sleep efficiency was only 63%.   IMPRESSION:  1.  Severe obstructive sleep apnea/hypopnea syndrome with a respiratory      disturbance index of 80 events/hr and oxygen desaturation as low as 79%.      Events were not positional.  2.  Loud snoring noted throughout the study.  3.  No clinically significant cardiac arrhythmias.      KC/MEDQ  D:  04/15/2004 16:01:12  T:  04/15/2004 18:31:08  Job:  [id number]

## 2010-11-14 ENCOUNTER — Encounter: Payer: Self-pay | Admitting: Internal Medicine

## 2010-11-14 ENCOUNTER — Ambulatory Visit (INDEPENDENT_AMBULATORY_CARE_PROVIDER_SITE_OTHER): Payer: Medicare Other | Admitting: Internal Medicine

## 2010-11-14 VITALS — BP 124/70 | HR 80 | Temp 98.6°F | Ht 67.5 in | Wt 167.0 lb

## 2010-11-14 DIAGNOSIS — E785 Hyperlipidemia, unspecified: Secondary | ICD-10-CM

## 2010-11-14 DIAGNOSIS — I10 Essential (primary) hypertension: Secondary | ICD-10-CM

## 2010-11-14 DIAGNOSIS — E119 Type 2 diabetes mellitus without complications: Secondary | ICD-10-CM

## 2010-11-14 NOTE — Progress Notes (Signed)
  Subjective:    Patient ID: Darius Castro, male    DOB: [birth year], 77 y.o.   MRN: [id number]  HPI  htn---tolerating meds witout difficulty---home bps average130s/70s  Lipids---tolerating meds  DM2--no meds and not taking CBGs at home  He remains active and exercises regularly  Past Medical History  Diagnosis Date  . Diabetes mellitus   . Hyperlipidemia   . Hypertension   . Arthritis   . Sleep apnea   . Adenomatous colon polyp 3/09  . Personal history of prostate cancer    Past Surgical History  Procedure Date  . Lumbar laminectomy 1998  . Tonsillectomy   . Knee arthroscopy 2001  . Rotator cuff repair 2/10    right  . Cataract extraction     bilateral    reports that he quit smoking about 49 years ago. He does not have any smokeless tobacco history on file. He reports that he does not drink alcohol. His drug history not on file. family history includes Colon polyps in his daughter; Heart attack in his father; Heart disease in his father; and Hypertension in his mother.  There is no history of Colon cancer. No Known Allergies   Review of Systems  patient denies chest pain, shortness of breath, orthopnea. Denies lower extremity edema, abdominal pain, change in appetite, change in bowel movements. Patient denies rashes, musculoskeletal complaints. No other specific complaints in a complete review of systems.      Objective:   Physical Exam  well-developed well-nourished male in no acute distress. HEENT exam atraumatic, normocephalic, neck supple without jugular venous distention. Chest clear to auscultation cardiac exam S1-S2 are regular. Abdominal exam overweight with bowel sounds, soft and nontender. Extremities no edema. Neurologic exam is alert with a normal gait.        Assessment & Plan:

## 2010-11-14 NOTE — Assessment & Plan Note (Signed)
Has not required meds

## 2010-11-14 NOTE — Assessment & Plan Note (Signed)
Previously controlled Continue same meds 

## 2010-11-14 NOTE — Assessment & Plan Note (Signed)
Adequate control Continue same meds 

## 2010-11-24 ENCOUNTER — Other Ambulatory Visit: Payer: Self-pay | Admitting: *Deleted

## 2010-11-24 DIAGNOSIS — E785 Hyperlipidemia, unspecified: Secondary | ICD-10-CM

## 2010-11-24 MED ORDER — ATORVASTATIN CALCIUM 40 MG PO TABS: 40.0000 mg | ORAL_TABLET | Freq: Every day | ORAL | Status: DC

## 2010-12-29 ENCOUNTER — Ambulatory Visit (INDEPENDENT_AMBULATORY_CARE_PROVIDER_SITE_OTHER): Payer: Medicare Other | Admitting: Internal Medicine

## 2010-12-29 ENCOUNTER — Encounter: Payer: Self-pay | Admitting: Internal Medicine

## 2010-12-29 DIAGNOSIS — M25551 Pain in right hip: Secondary | ICD-10-CM

## 2010-12-29 DIAGNOSIS — M25559 Pain in unspecified hip: Secondary | ICD-10-CM

## 2010-12-29 MED ORDER — DICLOFENAC SODIUM 50 MG PO TBEC: DELAYED_RELEASE_TABLET | ORAL | Status: DC

## 2010-12-29 NOTE — Progress Notes (Signed)
  Subjective:    Patient ID: Darius MONTENEGRO, male    DOB: 01/02/1924, 77 y.o.   MRN: [id number]  Flank Pain   Pt presents to clinic for evaluation of possible hip pain. Notes 2 week history of right groin pain without injury or trauma. Worsens with walking or movement of the leg. Denies loss of range of motion. Attempted over-the-counter anti-inflammatory with improvement. Denies fever, chills, change in bowel habits including constipation diarrhea, blood in stool or urinary symptoms such as frequency, hematuria or dysuria. Does have history of prostate cancer and recalls last PSA reportedly 0.06 dated June 2012. Has known history of left hip DJD based on x-ray several years ago. No other alleviating or exacerbating factors. No other complaints.  Reviewed past medical history, medications and allergies.  Review of Systems see history of present illness     Objective:   Physical Exam  Nursing note and vitals reviewed. Constitutional: He appears well-developed and well-nourished. No distress.  HENT:  Head: Normocephalic and atraumatic.  Abdominal: Soft. He exhibits no distension and no mass. There is no tenderness. There is no rebound and no guarding.  Musculoskeletal:       Right hip flexion reproduces pain. Internal and external hip rotation negative. No palpable click. Able to weight-bear and ambulate without assistance.  Neurological: He is alert.  Skin: Skin is warm and dry. He is not diaphoretic.  Psychiatric: He has a normal mood and affect.          Assessment & Plan:

## 2010-12-29 NOTE — Assessment & Plan Note (Signed)
Begin short course of Voltaren to be taken with food and no other anti-inflammatories. Follow up closely if no improvement or worsening.

## 2011-01-12 ENCOUNTER — Ambulatory Visit (INDEPENDENT_AMBULATORY_CARE_PROVIDER_SITE_OTHER): Payer: Medicare Other | Admitting: Internal Medicine

## 2011-01-12 ENCOUNTER — Encounter: Payer: Self-pay | Admitting: Internal Medicine

## 2011-01-12 VITALS — BP 100/60 | Temp 98.1°F | Wt 167.0 lb

## 2011-01-12 DIAGNOSIS — M1990 Unspecified osteoarthritis, unspecified site: Secondary | ICD-10-CM

## 2011-01-12 DIAGNOSIS — M25551 Pain in right hip: Secondary | ICD-10-CM

## 2011-01-12 DIAGNOSIS — M25559 Pain in unspecified hip: Secondary | ICD-10-CM

## 2011-01-12 MED ORDER — DICLOFENAC POTASSIUM 50 MG PO TABS: 50.0000 mg | ORAL_TABLET | Freq: Three times a day (TID) | ORAL | Status: DC

## 2011-01-12 NOTE — Patient Instructions (Addendum)
Orthopedic follow up as discussed  Call or return to clinic prn if these symptoms worsen or fail to improve as anticipated.

## 2011-01-13 ENCOUNTER — Encounter: Payer: Self-pay | Admitting: Internal Medicine

## 2011-01-13 NOTE — Progress Notes (Signed)
  Subjective:    Patient ID: Darius Castro, male    DOB: [birth year], 77 y.o.   MRN: [id number]  HPI 77 year old patient who presents with a chief complaint of right hip pain. He does have a history of osteoarthritis this has been a complaint in the past and he has had a x-ray approximately 2 years ago that did reveal osteoarthritic changes. Dr. Thurston Hole is his orthopedic physician.  The pain is aggravated by walking and weightbearing. Denies any constitutional complaints. No history of recent trauma  Review of Systems  Musculoskeletal: Positive for gait problem.       Objective:   Physical Exam  Constitutional: He appears well-developed and well-nourished. No distress.  Musculoskeletal:       Flexion of the right hip and internal rotation aggravated the pain          Assessment & Plan:   Osteoarthritis right hip. We'll increase the Voltaren to 50 mg 3 times a day with meals. The patient finds this helpful but short lived. Recommended he followup with Dr. Thurston Hole

## 2011-03-25 ENCOUNTER — Ambulatory Visit (INDEPENDENT_AMBULATORY_CARE_PROVIDER_SITE_OTHER): Payer: Medicare Other

## 2011-03-25 DIAGNOSIS — Z23 Encounter for immunization: Secondary | ICD-10-CM

## 2011-04-10 ENCOUNTER — Telehealth: Payer: Self-pay | Admitting: *Deleted

## 2011-04-10 NOTE — Telephone Encounter (Signed)
BP: 172/88 and 162-86 .  These are higher than usual.  Asymptomatic.  Advised if he develops headache, chest pain or SOB, to go to the ER.

## 2011-04-11 ENCOUNTER — Ambulatory Visit (INDEPENDENT_AMBULATORY_CARE_PROVIDER_SITE_OTHER): Payer: Medicare Other | Admitting: Family Medicine

## 2011-04-11 VITALS — BP 160/80 | HR 73 | Temp 97.4°F | Wt 164.0 lb

## 2011-04-11 DIAGNOSIS — I10 Essential (primary) hypertension: Secondary | ICD-10-CM

## 2011-04-11 MED ORDER — HYDROCHLOROTHIAZIDE 12.5 MG PO CAPS: 12.5000 mg | ORAL_CAPSULE | Freq: Every day | ORAL | Status: DC

## 2011-04-11 NOTE — Progress Notes (Signed)
Subjective:    Patient ID: Darius Castro, male    DOB: 10/10/1923, 77 y.o.   MRN: [id number]  HPI Here for high bp  Has been well controlled usually 130s/60s  Has some whitecoat syndrome also  At home bp 192/102 this am and then 185/82 Is feeling nervous  No headache or vision change No swelling of legs or cp or sob   2 shots in his hip in august and sept -- wore off and still in pain now   Just started meloxicam 2 weeks ago  No salty food lately   Thinks it is the bp caused him to be nervous   Took his monopril last night - takes each night  No missed doses that he remembers  Patient Active Problem List  Diagnoses  . DIABETES MELLITUS, TYPE II  . HYPERLIPIDEMIA  . HYPERTENSION  . BACK PAIN  . PROSTATE CANCER, HX OF  . COLONIC POLYPS, HX OF  . Hip pain, right  . Osteoarthritis   Past Medical History  Diagnosis Date  . Diabetes mellitus   . Hyperlipidemia   . Hypertension   . Arthritis   . Sleep apnea   . Adenomatous colon polyp 3/09  . Personal history of prostate cancer    Past Surgical History  Procedure Date  . Lumbar laminectomy 1998  . Tonsillectomy   . Knee arthroscopy 2001  . Rotator cuff repair 2/10    right  . Cataract extraction     bilateral   History  Substance Use Topics  . Smoking status: Former Smoker    Quit date: 06/22/1961  . Smokeless tobacco: Not on file  . Alcohol Use: No   Family History  Problem Relation Age of Onset  . Heart disease Father   . Heart attack Father   . Colon polyps Daughter   . Colon cancer Neg Hx   . Hypertension Mother    No Known Allergies Current Outpatient Prescriptions on File Prior to Visit  Medication Sig Dispense Refill  . Ascorbic Acid (VITAMIN C) 500 MG tablet Take 500 mg by mouth daily.        Marland Kitchen aspirin 325 MG tablet Take 325 mg by mouth daily.        Marland Kitchen atorvastatin (LIPITOR) 40 MG tablet Take 1 tablet (40 mg total) by mouth daily.  90 tablet  3  . Biotin 5000 MCG CAPS Take 1 capsule by mouth  daily.        . cetirizine (ZYRTEC) 10 MG tablet Take 10 mg by mouth daily.        . cholecalciferol (VITAMIN D) 1000 UNITS tablet Take 1,000 Units by mouth daily.        Marland Kitchen co-enzyme Q-10 30 MG capsule Take 30 mg by mouth daily.        . fosinopril (MONOPRIL) 40 MG tablet Take 40 mg by mouth daily.        Marland Kitchen topiramate (TOPAMAX) 25 MG tablet Take 25 mg by mouth daily.              Review of Systems Review of Systems  Constitutional: Negative for fever, appetite change, fatigue and unexpected weight change.  Eyes: Negative for pain and visual disturbance.  Respiratory: Negative for cough and shortness of breath.   Cardiovascular: Negative for cp or palpitations    Gastrointestinal: Negative for nausea, diarrhea and constipation.  Genitourinary: Negative for urgency and frequency.  Skin: Negative for pallor or rash   Neurological: Negative for  weakness, light-headedness, numbness and headaches.  Hematological: Negative for adenopathy. Does not bruise/bleed easily. MSK pos for hip pain chronic/ no joint swelling   Psychiatric/Behavioral: Negative for dysphoric mood. The patient is feeling nervous/anxious           Objective:   Physical Exam  Constitutional: He appears well-developed and well-nourished. No distress.  HENT:  Head: Normocephalic and atraumatic.  Mouth/Throat: Oropharynx is clear and moist.  Eyes: Conjunctivae and EOM are normal. Pupils are equal, round, and reactive to light.  Neck: Normal range of motion. Neck supple. No JVD present. Carotid bruit is not present. No thyromegaly present.  Cardiovascular: Normal rate, regular rhythm, normal heart sounds and intact distal pulses.  Exam reveals no gallop and no friction rub.   No murmur heard. Pulmonary/Chest: Effort normal and breath sounds normal. No respiratory distress. He has no wheezes.  Abdominal: Soft. Bowel sounds are normal. He exhibits no distension, no abdominal bruit and no mass. There is no tenderness.    Musculoskeletal: He exhibits no edema.  Lymphadenopathy:    He has no cervical adenopathy.  Neurological: He is alert. He has normal reflexes.       No tremor   Skin: Skin is warm and dry. No rash noted. He is not diaphoretic. No erythema. No pallor.  Psychiatric: Thought content normal.       Anxious overall - but less so at end of the visit Pleasant and talkative           Assessment & Plan:

## 2011-04-11 NOTE — Assessment & Plan Note (Signed)
Worse possibly from stress and new meloxicam inst to stop this or any nsaid  Start hctz Re assuring at rest re check 160/80 in office before he left  Update if side eff or no imp or ha or other symptoms F/u with primary in 1-2 wk

## 2011-04-11 NOTE — Patient Instructions (Signed)
Stop your meloxicam - it may be increasing your blood pressure  Avoid salt and drink water  Continue your other medicines Add hctz 12.5 mg one pill each am ( it will make you urinate a little more )  If blood pressure does not improve - please call Otherwise follow up with Dr Cato Mulligan in 1-2 weeks -- call and make that appt Monday

## 2011-04-11 NOTE — Telephone Encounter (Signed)
Probably best to add additional medication: Amlodipine 5 mg po qd #30/11

## 2011-04-17 ENCOUNTER — Ambulatory Visit (INDEPENDENT_AMBULATORY_CARE_PROVIDER_SITE_OTHER): Payer: Medicare Other | Admitting: Internal Medicine

## 2011-04-17 ENCOUNTER — Encounter: Payer: Self-pay | Admitting: Internal Medicine

## 2011-04-17 VITALS — BP 120/80 | Temp 98.0°F | Wt 161.0 lb

## 2011-04-17 DIAGNOSIS — H539 Unspecified visual disturbance: Secondary | ICD-10-CM

## 2011-04-17 MED ORDER — TRAMADOL HCL 50 MG PO TABS: 50.0000 mg | ORAL_TABLET | Freq: Four times a day (QID) | ORAL | Status: DC | PRN

## 2011-04-17 MED ORDER — HYDROCODONE-ACETAMINOPHEN 5-325 MG PO TABS: 1.0000 | ORAL_TABLET | Freq: Two times a day (BID) | ORAL | Status: AC | PRN

## 2011-04-17 NOTE — Telephone Encounter (Signed)
Spoke with patient and he went to the Saturday clinic and his blood pressure readings have been around 130/68.  He has a physical in two weeks.

## 2011-04-17 NOTE — Telephone Encounter (Signed)
rx called in, pt aware 

## 2011-04-17 NOTE — Telephone Encounter (Signed)
Trial vicodin 5/325 , 1/2-1 po bid prn pain #30/1 refill

## 2011-04-17 NOTE — Telephone Encounter (Signed)
He is still having pain in his right hip and would like to know if something can be called in?  [facility] [address].

## 2011-04-17 NOTE — Progress Notes (Signed)
  Subjective:    Patient ID: Darius Castro, male    DOB: 1924/04/13, 77 y.o.   MRN: [id number]  HPI  68 -year-old patient who is seen today for followup. He has a history of treated hypertension and dyslipidemia. He was seen one week ago with elevated blood pressure and now is on hydrochlorothiazide. He states that in 2008 he had an episode of blurred vision and was evaluated by neurology. He states that he was diagnosed with a migraine equivalent at that time and has been on Topamax since this diagnosis. Today he presents with a 10-15 minute episode of blurred vision. He basically describes blurred vision and a sense of wavy lines. This started at 11:30 this morning and lasted approximately 15 minutes. He had no other neurological symptoms and presently feels well.  He states that he has had a carotid artery Doppler studies performed years ago    Review of Systems  Constitutional: Negative for fever, chills, appetite change and fatigue.  HENT: Negative for hearing loss, ear pain, congestion, sore throat, trouble swallowing, neck stiffness, dental problem, voice change and tinnitus.   Eyes: Positive for visual disturbance. Negative for pain and discharge.  Respiratory: Negative for cough, chest tightness, wheezing and stridor.   Cardiovascular: Negative for chest pain, palpitations and leg swelling.  Gastrointestinal: Negative for nausea, vomiting, abdominal pain, diarrhea, constipation, blood in stool and abdominal distention.  Genitourinary: Negative for urgency, hematuria, flank pain, discharge, difficulty urinating and genital sores.  Musculoskeletal: Negative for myalgias, back pain, joint swelling, arthralgias and gait problem.  Skin: Negative for rash.  Neurological: Negative for dizziness, syncope, speech difficulty, weakness, numbness and headaches.  Hematological: Negative for adenopathy. Does not bruise/bleed easily.  Psychiatric/Behavioral: Negative for behavioral problems and dysphoric  mood. The patient is not nervous/anxious.        Objective:   Physical Exam  Constitutional: He is oriented to person, place, and time. He appears well-developed.       Blood pressure 120/80 right arm 130/80 left  HENT:  Head: Normocephalic.  Right Ear: External ear normal.  Left Ear: External ear normal.  Eyes: Conjunctivae and EOM are normal.       Status post cataract surgery with mild pupillary irregularity  Neck: Normal range of motion.       No bruits. Carotid upstroke normal  Cardiovascular: Normal rate and normal heart sounds.   Pulmonary/Chest: Breath sounds normal.  Abdominal: Bowel sounds are normal.  Musculoskeletal: Normal range of motion. He exhibits no edema and no tenderness.  Neurological: He is alert and oriented to person, place, and time. He has normal reflexes. No cranial nerve deficit. Coordination normal.       No drift of the outstretched arms Finger to nose  testing performed normally  Psychiatric: He has a normal mood and affect. His behavior is normal.          Assessment & Plan:   Episode of visual disturbance.  History of migraines equivalent. We'll continue on daily aspirin. His blood pressure is well controlled. We'll check a carotid artery Doppler studies. The patient will report to the ED if he has any recurrent symptoms to rule out TIA Hypertension well controlled Dyslipidemia

## 2011-04-17 NOTE — Patient Instructions (Signed)
Report to the emergency department if he develops any further symptoms Continue daily aspirin Carotid artery Doppler study as discussed  Return in one month for followup

## 2011-04-20 ENCOUNTER — Telehealth: Payer: Self-pay | Admitting: *Deleted

## 2011-04-20 ENCOUNTER — Encounter (INDEPENDENT_AMBULATORY_CARE_PROVIDER_SITE_OTHER): Payer: Medicare Other | Admitting: Cardiology

## 2011-04-20 DIAGNOSIS — R55 Syncope and collapse: Secondary | ICD-10-CM

## 2011-04-20 DIAGNOSIS — H53129 Transient visual loss, unspecified eye: Secondary | ICD-10-CM

## 2011-04-20 DIAGNOSIS — I6529 Occlusion and stenosis of unspecified carotid artery: Secondary | ICD-10-CM

## 2011-04-20 DIAGNOSIS — H539 Unspecified visual disturbance: Secondary | ICD-10-CM

## 2011-04-20 NOTE — Telephone Encounter (Signed)
Pt 's daughter is very concerned about pt and his fainting episodes.  States she has witnessed him fainting this weekend.  She lives 2 hours away, but is going to come back to his next appt with Dr. Cato Mulligan to discuss his carotid doppler studies.

## 2011-04-20 NOTE — Telephone Encounter (Signed)
Notified daughter

## 2011-04-20 NOTE — Telephone Encounter (Signed)
Refer Cardiology

## 2011-04-24 ENCOUNTER — Encounter: Payer: Self-pay | Admitting: Internal Medicine

## 2011-04-24 ENCOUNTER — Ambulatory Visit (INDEPENDENT_AMBULATORY_CARE_PROVIDER_SITE_OTHER): Payer: Medicare Other | Admitting: Internal Medicine

## 2011-04-24 VITALS — BP 89/64 | HR 85 | Ht 68.0 in | Wt 157.0 lb

## 2011-04-24 DIAGNOSIS — I10 Essential (primary) hypertension: Secondary | ICD-10-CM

## 2011-04-24 DIAGNOSIS — R55 Syncope and collapse: Secondary | ICD-10-CM

## 2011-04-24 DIAGNOSIS — R5383 Other fatigue: Secondary | ICD-10-CM

## 2011-04-24 DIAGNOSIS — E785 Hyperlipidemia, unspecified: Secondary | ICD-10-CM

## 2011-04-24 DIAGNOSIS — R5381 Other malaise: Secondary | ICD-10-CM

## 2011-04-24 LAB — CBC WITH DIFFERENTIAL/PLATELET
Basophils Absolute: 0 10*3/uL (ref 0.0–0.1)
HCT: 39.1 % (ref 39.0–52.0)
Hemoglobin: 13 g/dL (ref 13.0–17.0)
Lymphs Abs: 1 10*3/uL (ref 0.7–4.0)
MCHC: 33.4 g/dL (ref 30.0–36.0)
MCV: 95.4 fl (ref 78.0–100.0)
Monocytes Absolute: 0.5 10*3/uL (ref 0.1–1.0)
Monocytes Relative: 7.4 % (ref 3.0–12.0)
Neutro Abs: 5.4 10*3/uL (ref 1.4–7.7)
RDW: 13.4 % (ref 11.5–14.6)

## 2011-04-24 LAB — BASIC METABOLIC PANEL WITH GFR
BUN: 25 mg/dL — ABNORMAL HIGH (ref 6–23)
CO2: 28 meq/L (ref 19–32)
Calcium: 9.1 mg/dL (ref 8.4–10.5)
Chloride: 101 meq/L (ref 96–112)
Creatinine, Ser: 1.3 mg/dL (ref 0.4–1.5)
GFR: 57.21 mL/min — ABNORMAL LOW
Glucose, Bld: 91 mg/dL (ref 70–99)
Potassium: 3.8 meq/L (ref 3.5–5.1)
Sodium: 138 meq/L (ref 135–145)

## 2011-04-24 LAB — TSH: TSH: 2.04 u[IU]/mL (ref 0.35–5.50)

## 2011-04-24 NOTE — Progress Notes (Signed)
HPI Patient is an 77 year old who was referred for evaluation of syncope. He was seen in medicine clinic on 10/20.  BP was elevated  He was started on HCTZ at that time.   Last Saturday  Passed out Around noon legs felt weak. NO dizziness  Hit floor  Woke up.  He has not had any further spells. He denies CP.   Breathing is OK. Dizzy when hot and gets up  Not when cool  No Known Allergies  Current Outpatient Prescriptions  Medication Sig Dispense Refill  . Ascorbic Acid (VITAMIN C) 500 MG tablet Take 500 mg by mouth daily.        Marland Kitchen aspirin 325 MG tablet Take 325 mg by mouth daily.        Marland Kitchen atorvastatin (LIPITOR) 40 MG tablet Take 1 tablet (40 mg total) by mouth daily.  90 tablet  3  . Biotin 5000 MCG CAPS Take 1 capsule by mouth daily.        . cetirizine (ZYRTEC) 10 MG tablet Take 10 mg by mouth daily.        . cholecalciferol (VITAMIN D) 1000 UNITS tablet Take 1,000 Units by mouth daily.        Marland Kitchen co-enzyme Q-10 30 MG capsule Take 30 mg by mouth daily.        . fosinopril (MONOPRIL) 40 MG tablet Take 40 mg by mouth daily.        . hydrochlorothiazide (MICROZIDE) 12.5 MG capsule Take 1 capsule (12.5 mg total) by mouth daily.  30 capsule  0  . HYDROcodone-acetaminophen (NORCO) 5-325 MG per tablet Take 1 tablet by mouth 2 (two) times daily as needed for pain.  30 tablet  1  . topiramate (TOPAMAX) 25 MG tablet Take 25 mg by mouth daily.          Past Medical History  Diagnosis Date  . Diabetes mellitus   . Hyperlipidemia   . Hypertension   . Arthritis   . Sleep apnea   . Adenomatous colon polyp 3/09  . Personal history of prostate cancer     Past Surgical History  Procedure Date  . Lumbar laminectomy 1998  . Tonsillectomy   . Knee arthroscopy 2001  . Rotator cuff repair 2/10    right  . Cataract extraction     bilateral    Family History  Problem Relation Age of Onset  . Heart disease Father   . Heart attack Father   . Colon polyps Daughter   . Colon cancer Neg Hx     . Hypertension Mother     History   Social History  . Marital Status: Married    Spouse Name: N/A    Number of Children: N/A  . Years of Education: N/A   Occupational History  . Not on file.   Social History Main Topics  . Smoking status: Former Smoker    Quit date: 06/22/1961  . Smokeless tobacco: Not on file  . Alcohol Use: No  . Drug Use: Not on file  . Sexually Active: Not on file   Other Topics Concern  . Not on file   Social History Narrative  . No narrative on file    Review of Systems:  All systems reviewed.  They are negative to the above problem except as previously stated.  Vital Signs: BP 89/64  Pulse 85  Ht 5\' 8"  (1.727 m)  Wt 157 lb (71.215 kg)  BMI 23.87 kg/m2  Physical Exam  Patient is in NAD. HEENT:  Normocephalic, atraumatic. EOMI, PERRLA.  Neck: JVP is normal. No thyromegaly. No bruits.  Lungs: clear to auscultation. No rales no wheezes.  Heart: Regular rate and rhythm. Normal S1, S2. No S3.   No significant murmurs. PMI not displaced.  Abdomen:  Supple, nontender. Normal bowel sounds. No masses. No hepatomegaly.  Extremities:   Good distal pulses throughout. No lower extremity edema.  Musculoskeletal :moving all extremities.  Neuro:   alert and oriented x3.  CN II-XII grossly intact.  EKG:  Sinus rhythm.  First degree AV block (PR 206 msec).  QTc 431  Assessment and Plan:

## 2011-04-24 NOTE — Patient Instructions (Signed)
Lab work today We will call you with results  Your physician has requested that you have an echocardiogram. Echocardiography is a painless test that uses sound waves to create images of your heart. It provides your doctor with information about the size and shape of your heart and how well your heart's chambers and valves are working. This procedure takes approximately one hour. There are no restrictions for this procedure.  STOP HCTZ

## 2011-04-28 ENCOUNTER — Other Ambulatory Visit: Payer: Self-pay | Admitting: *Deleted

## 2011-04-28 ENCOUNTER — Other Ambulatory Visit: Payer: Self-pay | Admitting: Dermatology

## 2011-04-28 MED ORDER — TOPIRAMATE 25 MG PO TABS: 25.0000 mg | ORAL_TABLET | Freq: Every day | ORAL | Status: DC

## 2011-05-01 ENCOUNTER — Telehealth: Payer: Self-pay | Admitting: Internal Medicine

## 2011-05-01 NOTE — Telephone Encounter (Signed)
Pt returning call from nurse from yesterday regarding lab results. Please return pt call to discuss further.

## 2011-05-05 ENCOUNTER — Ambulatory Visit (HOSPITAL_COMMUNITY): Payer: Medicare Other | Attending: Cardiology

## 2011-05-05 DIAGNOSIS — I079 Rheumatic tricuspid valve disease, unspecified: Secondary | ICD-10-CM | POA: Insufficient documentation

## 2011-05-05 DIAGNOSIS — I10 Essential (primary) hypertension: Secondary | ICD-10-CM | POA: Insufficient documentation

## 2011-05-05 DIAGNOSIS — I379 Nonrheumatic pulmonary valve disorder, unspecified: Secondary | ICD-10-CM | POA: Insufficient documentation

## 2011-05-05 DIAGNOSIS — R55 Syncope and collapse: Secondary | ICD-10-CM | POA: Insufficient documentation

## 2011-05-05 DIAGNOSIS — E119 Type 2 diabetes mellitus without complications: Secondary | ICD-10-CM | POA: Insufficient documentation

## 2011-05-05 DIAGNOSIS — I080 Rheumatic disorders of both mitral and aortic valves: Secondary | ICD-10-CM | POA: Insufficient documentation

## 2011-05-05 NOTE — Assessment & Plan Note (Signed)
Excellent control of lipids.

## 2011-05-05 NOTE — Assessment & Plan Note (Addendum)
Patient with recent syncopal spell.  He is orthostatic on exam today.  I think that the recent spell could be explained by recent addition of a diuretic in a patient that may already be susceptible to orthostatic intolerance. He should not take HCTZ.  I encouraged increased fluid and salt intake. Get up slowly. Will check labs as well as a 2D echo to evaluate LV function. May need to back off on fosinopril

## 2011-05-05 NOTE — Assessment & Plan Note (Signed)
Orthostatic on exam  See previous section on syncope.

## 2011-05-06 ENCOUNTER — Other Ambulatory Visit (INDEPENDENT_AMBULATORY_CARE_PROVIDER_SITE_OTHER): Payer: Medicare Other

## 2011-05-06 DIAGNOSIS — E119 Type 2 diabetes mellitus without complications: Secondary | ICD-10-CM

## 2011-05-06 LAB — HEPATIC FUNCTION PANEL
ALT: 18 U/L (ref 0–53)
Albumin: 3.9 g/dL (ref 3.5–5.2)
Alkaline Phosphatase: 52 U/L (ref 39–117)
Bilirubin, Direct: 0.1 mg/dL (ref 0.0–0.3)
Total Protein: 6.6 g/dL (ref 6.0–8.3)

## 2011-05-06 LAB — LIPID PANEL
LDL Cholesterol: 50 mg/dL (ref 0–99)
VLDL: 7.2 mg/dL (ref 0.0–40.0)

## 2011-05-06 LAB — BASIC METABOLIC PANEL
BUN: 17 mg/dL (ref 6–23)
Calcium: 8.9 mg/dL (ref 8.4–10.5)
Chloride: 109 mEq/L (ref 96–112)
Creatinine, Ser: 1.1 mg/dL (ref 0.4–1.5)

## 2011-05-06 LAB — HEMOGLOBIN A1C: Hgb A1c MFr Bld: 6.3 % (ref 4.6–6.5)

## 2011-05-13 ENCOUNTER — Ambulatory Visit (INDEPENDENT_AMBULATORY_CARE_PROVIDER_SITE_OTHER): Payer: Medicare Other | Admitting: Internal Medicine

## 2011-05-13 ENCOUNTER — Encounter: Payer: Self-pay | Admitting: Internal Medicine

## 2011-05-13 DIAGNOSIS — M1990 Unspecified osteoarthritis, unspecified site: Secondary | ICD-10-CM

## 2011-05-13 DIAGNOSIS — I10 Essential (primary) hypertension: Secondary | ICD-10-CM

## 2011-05-13 NOTE — Assessment & Plan Note (Addendum)
End Stage disease Scheduled for surgery in February He is medically cleared--has seen Dr. Tenny Craw for cardiac surgery  Reviewed recent labs   Chemistry      Component Value Date/Time   NA 143 05/06/2011 0812   K 4.5 05/06/2011 0812   CL 109 05/06/2011 0812   CO2 26 05/06/2011 0812   BUN 17 05/06/2011 0812   CREATININE 1.1 05/06/2011 0812      Component Value Date/Time   CALCIUM 8.9 05/06/2011 0812   ALKPHOS 52 05/06/2011 0812   AST 21 05/06/2011 0812   ALT 18 05/06/2011 0812   BILITOT 0.7 05/06/2011 0812     Reviewed last cbc Lab Results  Component Value Date   WBC 7.0 04/24/2011   HGB 13.0 04/24/2011   HCT 39.1 04/24/2011   MCV 95.4 04/24/2011   PLT 198.0 04/24/2011     Patient would like earlier surgery.

## 2011-05-13 NOTE — Assessment & Plan Note (Signed)
BP Readings from Last 3 Encounters:  05/13/11 122/64  04/24/11 89/64  04/17/11 120/80   Well controlled Continue same meds

## 2011-05-13 NOTE — Progress Notes (Signed)
patient comes in for followup of multiple medical problems including type 2 diabetes, hyperlipidemia, hypertension. The patient does not check blood sugar or blood pressure at home. The patetient does not follow an exercise or diet program. The patient denies any polyuria, polydipsia.  In the past the patient has gone to diabetic treatment center. The patient is tolerating medications  Without difficulty. The patient does admit to medication compliance.   In addition needs hip surgery and is asking for medical clearance.   Pt has seen dr. Sinda Du had echocardiogram.  Past Medical History  Diagnosis Date  . Diabetes mellitus   . Hyperlipidemia   . Hypertension   . Arthritis   . Sleep apnea   . Adenomatous colon polyp 3/09  . Personal history of prostate cancer     History   Social History  . Marital Status: Married    Spouse Name: N/A    Number of Children: N/A  . Years of Education: N/A   Occupational History  . Not on file.   Social History Main Topics  . Smoking status: Former Smoker    Quit date: 06/22/1961  . Smokeless tobacco: Not on file  . Alcohol Use: No  . Drug Use: Not on file  . Sexually Active: Not on file   Other Topics Concern  . Not on file   Social History Narrative  . No narrative on file    Past Surgical History  Procedure Date  . Lumbar laminectomy 1998  . Tonsillectomy   . Knee arthroscopy 2001  . Rotator cuff repair 2/10    right  . Cataract extraction     bilateral    Family History  Problem Relation Age of Onset  . Heart disease Father   . Heart attack Father   . Colon polyps Daughter   . Colon cancer Neg Hx   . Hypertension Mother     No Known Allergies  Current Outpatient Prescriptions on File Prior to Visit  Medication Sig Dispense Refill  . Ascorbic Acid (VITAMIN C) 500 MG tablet Take 500 mg by mouth daily.        Marland Kitchen aspirin 325 MG tablet Take 325 mg by mouth daily.        Marland Kitchen atorvastatin (LIPITOR) 40 MG tablet Take 1  tablet (40 mg total) by mouth daily.  90 tablet  3  . Biotin 5000 MCG CAPS Take 1 capsule by mouth daily.        . cetirizine (ZYRTEC) 10 MG tablet Take 10 mg by mouth daily.        . cholecalciferol (VITAMIN D) 1000 UNITS tablet Take 1,000 Units by mouth daily.        Marland Kitchen co-enzyme Q-10 30 MG capsule Take 30 mg by mouth daily.        . fosinopril (MONOPRIL) 40 MG tablet Take 40 mg by mouth daily.        Marland Kitchen topiramate (TOPAMAX) 25 MG tablet Take 1 tablet (25 mg total) by mouth daily.  90 tablet  3     patient denies chest pain, shortness of breath, orthopnea. Denies lower extremity edema, abdominal pain, change in appetite, change in bowel movements. Patient denies rashes, musculoskeletal complaints. No other specific complaints in a complete review of systems.   BP 122/64  Pulse 92  Temp(Src) 98.5 F (36.9 C) (Oral)  Ht 5\' 8"  (1.727 m)  Wt 158 lb (71.668 kg)  BMI 24.02 kg/m2    well-developed well-nourished male in no acute  distress. HEENT exam atraumatic, normocephalic, neck supple without jugular venous distention. Chest clear to auscultation cardiac exam S1-S2 are regular. Abdominal exam overweight with bowel sounds, soft and nontender. Extremities no edema. Neurologic exam is alert with a normal gait.

## 2011-06-18 ENCOUNTER — Other Ambulatory Visit: Payer: Self-pay | Admitting: *Deleted

## 2011-06-18 MED ORDER — HYDROCODONE-ACETAMINOPHEN 5-325 MG PO TABS: 1.0000 | ORAL_TABLET | Freq: Two times a day (BID) | ORAL | Status: DC | PRN

## 2011-07-21 ENCOUNTER — Other Ambulatory Visit: Payer: Self-pay | Admitting: *Deleted

## 2011-07-21 MED ORDER — FOSINOPRIL SODIUM 40 MG PO TABS: 40.0000 mg | ORAL_TABLET | Freq: Every day | ORAL | Status: DC

## 2011-08-10 ENCOUNTER — Encounter (HOSPITAL_COMMUNITY): Payer: Self-pay | Admitting: Pharmacy Technician

## 2011-08-11 ENCOUNTER — Other Ambulatory Visit: Payer: Self-pay | Admitting: Physician Assistant

## 2011-08-11 NOTE — H&P (Signed)
Darius Castro Castro is an 78 y.o. male.   Chief Complaint: Right hip DJD HPI: Darius Castro Castro is seen for follow-up for his persistent right hip pain with degenerative joint disease. We saw him 5 weeks ago and placed him on Mobic with minimal relief. He had a previous hip injection a number of months ago with temporary relief. He's having significant persistent pain and cannot walk any distances without significant pain.  The excruciating pain is getting progressively worse, limiting activities of daily living, poses a significant fall risk, and has failed multiple conservative treatments including intraarticular cortisone injections, medication and home physical therapy.  Past Medical History  Diagnosis Date  . Diabetes mellitus   . Hyperlipidemia   . Hypertension   . Arthritis   . Sleep apnea   . Adenomatous colon polyp 3/09  . Personal history of prostate cancer     Past Surgical History  Procedure Date  . Lumbar laminectomy 1998  . Tonsillectomy   . Knee arthroscopy 2001  . Rotator cuff repair 2/10    right  . Cataract extraction     bilateral    Family History  Problem Relation Age of Onset  . Heart disease Father   . Heart attack Father   . Colon polyps Daughter   . Colon cancer Neg Hx   . Hypertension Mother    Social History:  reports that he quit smoking about 50 years ago. He does not have any smokeless tobacco history on file. He reports that he does not drink alcohol. His drug history not on file.  Allergies: No Known Allergies  Medications Prior to Admission  Medication Sig Dispense Refill  . ammonium lactate (LAC-HYDRIN) 12 % lotion Apply 1 application topically daily.      . Ascorbic Acid (VITAMIN C) 500 MG tablet Take 500 mg by mouth daily.        . aspirin 325 MG tablet Take 325 mg by mouth daily.        . atorvastatin (LIPITOR) 40 MG tablet Take 40 mg by mouth daily.      . cetirizine (ZYRTEC) 10 MG tablet Take 10 mg by mouth daily.        . cholecalciferol (VITAMIN  D) 1000 UNITS tablet Take 1,000 Units by mouth daily.        . fosinopril (MONOPRIL) 40 MG tablet Take 40 mg by mouth daily.      . HYDROcodone-acetaminophen (NORCO) 5-325 MG per tablet Take 1 tablet by mouth daily.      . topiramate (TOPAMAX) 25 MG tablet Take 25 mg by mouth daily.       No current facility-administered medications on file as of 08/11/2011.    No results found for this or any previous visit (from the past 48 hour(s)). No results found.  Review of Systems  Constitutional: Negative.   HENT: Negative.   Eyes: Negative.   Respiratory: Negative.   Cardiovascular: Negative.   Gastrointestinal: Negative.   Genitourinary: Negative.   Musculoskeletal: Positive for joint pain.       Right hip pain  Skin: Negative.   Neurological: Negative.   Endo/Heme/Allergies: Negative.   Psychiatric/Behavioral: Negative.     Blood pressure 154/79, pulse 65, temperature 98.8 F (37.1 C), temperature source Oral, height 5' 7" (1.702 m), weight 71.215 kg (157 lb), SpO2 96.00%. Physical Exam  Constitutional: He is oriented to person, place, and time. He appears well-developed and well-nourished.  HENT:  Head: Normocephalic and atraumatic.  Mouth/Throat: Oropharynx is   clear and moist.  Eyes: Conjunctivae and EOM are normal. Pupils are equal, round, and reactive to light.  Neck: Neck supple.  Cardiovascular: Normal rate and regular rhythm.   Respiratory: Effort normal.  GI: Soft. Bowel sounds are normal.  Genitourinary:       Not pertinent to current symptomatology therefore not examined.  Musculoskeletal: He exhibits tenderness.       Pt is independently ambulatory with a moderately antalgic gait and the assistance of a cane.  Pelvis is level. Examination of his right hip reveals flexion to 90 extension to 0, internal and external rotation of 10 degrees with pain. Exam of the left hip reveals full range of motion without pain. Vascular exam: pulses 2+ and symmetric.  Neurological: He  is alert and oriented to person, place, and time.  Skin: Skin is warm and dry.  Psychiatric: He has a normal mood and affect. His behavior is normal. Judgment and thought content normal.    Xray: AP Pelvis, AP and LAT right hip show joint space narrowing, with subchondral cyst formation and acetabular spurs.  Assessment Patient Active Problem List  Diagnoses  . DIABETES MELLITUS, TYPE II  . HYPERLIPIDEMIA  . HYPERTENSION  . PROSTATE CANCER, HX OF  . COLONIC POLYPS, HX OF  . Hip pain, right  . Osteoarthritis  . Syncope    Plan I talk to him in detail about his right hip. He does want to proceed with right total hip replacement due to intractable pain and significant limitation of motion. Discussed risks benefits and possible complications of the surgery in detail and he understands this completely. He has been cleared preoperatively by Dr. Swords, Dr. Ross and we'll obtain Dr. Kimbrough's records from urology even though he's retired he was to be set up to see his replacement in June.  [place] Darius Castro J 08/11/2011, 3:39 PM    

## 2011-08-20 ENCOUNTER — Encounter (HOSPITAL_COMMUNITY)
Admission: RE | Admit: 2011-08-20 | Discharge: 2011-08-20 | Disposition: A | Discharge status: Home / Self Care | Payer: Medicare Other | Source: Ambulatory Visit | Attending: Orthopedic Surgery | Admitting: Orthopedic Surgery

## 2011-08-20 ENCOUNTER — Encounter (HOSPITAL_COMMUNITY): Payer: Self-pay

## 2011-08-20 ENCOUNTER — Encounter (HOSPITAL_COMMUNITY)
Admission: RE | Admit: 2011-08-20 | Discharge: 2011-08-20 | Disposition: A | Discharge status: Home / Self Care | Payer: Medicare Other | Source: Ambulatory Visit | Attending: Physician Assistant | Admitting: Physician Assistant

## 2011-08-20 HISTORY — DX: Dorsalgia, unspecified: M54.9

## 2011-08-20 HISTORY — DX: Xerosis cutis: L85.3

## 2011-08-20 HISTORY — DX: Reserved for inherently not codable concepts without codable children: IMO0001

## 2011-08-20 HISTORY — DX: Unspecified hemorrhoids: K64.9

## 2011-08-20 HISTORY — DX: Dizziness and giddiness: R42

## 2011-08-20 HISTORY — DX: Pneumonia, unspecified organism: J18.9

## 2011-08-20 HISTORY — DX: Unspecified hearing loss, unspecified ear: H91.90

## 2011-08-20 HISTORY — DX: Personal history of colon polyps, unspecified: Z86.0100

## 2011-08-20 HISTORY — DX: Personal history of colonic polyps: Z86.010

## 2011-08-20 HISTORY — DX: Nocturia: R35.1

## 2011-08-20 LAB — CBC
MCH: 31.1 pg (ref 26.0–34.0)
MCHC: 33.9 g/dL (ref 30.0–36.0)
Platelets: 217 10*3/uL (ref 150–400)
RBC: 4.05 MIL/uL — ABNORMAL LOW (ref 4.22–5.81)

## 2011-08-20 LAB — SURGICAL PCR SCREEN: Staphylococcus aureus: POSITIVE — AB

## 2011-08-20 LAB — COMPREHENSIVE METABOLIC PANEL
ALT: 13 U/L (ref 0–53)
AST: 14 U/L (ref 0–37)
Calcium: 9.4 mg/dL (ref 8.4–10.5)
Sodium: 141 mEq/L (ref 135–145)
Total Protein: 6.7 g/dL (ref 6.0–8.3)

## 2011-08-20 LAB — URINALYSIS, ROUTINE W REFLEX MICROSCOPIC
Bilirubin Urine: NEGATIVE
Nitrite: NEGATIVE
Specific Gravity, Urine: 1.019 (ref 1.005–1.030)
Urobilinogen, UA: 1 mg/dL (ref 0.0–1.0)

## 2011-08-20 LAB — TYPE AND SCREEN: Antibody Screen: NEGATIVE

## 2011-08-20 LAB — DIFFERENTIAL
Eosinophils Relative: 1 % (ref 0–5)
Lymphocytes Relative: 11 % — ABNORMAL LOW (ref 12–46)
Lymphs Abs: 1.2 10*3/uL (ref 0.7–4.0)
Monocytes Absolute: 0.7 10*3/uL (ref 0.1–1.0)

## 2011-08-20 MED ORDER — CHLORHEXIDINE GLUCONATE 4 % EX LIQD: 60.0000 mL | Freq: Once | CUTANEOUS | Status: DC

## 2011-08-20 NOTE — Progress Notes (Addendum)
Cardiologist is Dr.Ross with [facility]-last visit 04/2011-in epic  Denies having a heart cath Last stress test in 08/30/03 Echo done 05/05/11-see epic

## 2011-08-20 NOTE — Pre-Procedure Instructions (Signed)
20 OLUWATOMISIN DEMAN  08/20/2011   Your procedure is scheduled on:  Mon, Mar 4 @ 0845  Report to [facility] at 0645 AM.  Call this number if you have problems the morning of surgery: [phone]   Remember:   Do not eat food:After Midnight.  May have clear liquids: up to 4 Hours before arrival.(until 2:45 am)  Clear liquids include soda, tea, black coffee, apple or grape juice, broth.  Take these medicines the morning of surgery with A SIP OF WATER: Zyrtec and Pain Pill(if needed)   Do not wear jewelry, make-up or nail polish.  Do not wear lotions, powders, or perfumes. You may wear deodorant.  Do not shave 48 hours prior to surgery.  Do not bring valuables to the hospital.  Contacts, dentures or bridgework may not be worn into surgery.  Leave suitcase in the car. After surgery it may be brought to your room.  For patients admitted to the hospital, checkout time is 11:00 AM the day of discharge.   Patients discharged the day of surgery will not be allowed to drive home.  Name and phone number of your driver:   Special Instructions: Incentive Spirometry - Practice and bring it with you on the day of surgery. and CHG Shower Use Special Wash: 1/2 bottle night before surgery and 1/2 bottle morning of surgery.   Please read over the following fact sheets that you were given: Pain Booklet, Coughing and Deep Breathing, Blood Transfusion Information, Total Joint Packet, MRSA Information and Surgical Site Infection Prevention

## 2011-08-21 LAB — URINE CULTURE

## 2011-08-21 LAB — ABO/RH: ABO/RH(D): A POS

## 2011-08-23 MED ORDER — CEFAZOLIN SODIUM 1-5 GM-% IV SOLN
1.0000 g | INTRAVENOUS | Status: AC
  Administered 2011-08-24: 1 g via INTRAVENOUS

## 2011-08-24 ENCOUNTER — Encounter (HOSPITAL_COMMUNITY): Payer: Self-pay | Admitting: Anesthesiology

## 2011-08-24 ENCOUNTER — Ambulatory Visit (HOSPITAL_COMMUNITY): Payer: Medicare Other

## 2011-08-24 ENCOUNTER — Encounter (HOSPITAL_COMMUNITY)
Admission: RE | Disposition: A | Discharge status: Skilled Nursing Facility | Payer: Self-pay | Source: Ambulatory Visit | Attending: Orthopedic Surgery

## 2011-08-24 ENCOUNTER — Ambulatory Visit (HOSPITAL_COMMUNITY): Payer: Medicare Other | Admitting: Anesthesiology

## 2011-08-24 ENCOUNTER — Inpatient Hospital Stay (HOSPITAL_COMMUNITY)
Admission: RE | Admit: 2011-08-24 | Discharge: 2011-08-27 | DRG: 470 | Disposition: A | Discharge status: Skilled Nursing Facility | Payer: Medicare Other | Source: Ambulatory Visit | Attending: Orthopedic Surgery | Admitting: Orthopedic Surgery

## 2011-08-24 DIAGNOSIS — Z01812 Encounter for preprocedural laboratory examination: Secondary | ICD-10-CM

## 2011-08-24 DIAGNOSIS — I10 Essential (primary) hypertension: Secondary | ICD-10-CM | POA: Diagnosis present

## 2011-08-24 DIAGNOSIS — Z860100 Personal history of colon polyps, unspecified: Secondary | ICD-10-CM

## 2011-08-24 DIAGNOSIS — M169 Osteoarthritis of hip, unspecified: Principal | ICD-10-CM | POA: Diagnosis present

## 2011-08-24 DIAGNOSIS — E119 Type 2 diabetes mellitus without complications: Secondary | ICD-10-CM | POA: Diagnosis present

## 2011-08-24 DIAGNOSIS — D72829 Elevated white blood cell count, unspecified: Secondary | ICD-10-CM | POA: Diagnosis not present

## 2011-08-24 DIAGNOSIS — Z8546 Personal history of malignant neoplasm of prostate: Secondary | ICD-10-CM

## 2011-08-24 DIAGNOSIS — Z01818 Encounter for other preprocedural examination: Secondary | ICD-10-CM

## 2011-08-24 DIAGNOSIS — Z7982 Long term (current) use of aspirin: Secondary | ICD-10-CM

## 2011-08-24 DIAGNOSIS — G4730 Sleep apnea, unspecified: Secondary | ICD-10-CM | POA: Diagnosis present

## 2011-08-24 DIAGNOSIS — D62 Acute posthemorrhagic anemia: Secondary | ICD-10-CM | POA: Diagnosis not present

## 2011-08-24 DIAGNOSIS — K225 Diverticulum of esophagus, acquired: Secondary | ICD-10-CM | POA: Diagnosis present

## 2011-08-24 DIAGNOSIS — I959 Hypotension, unspecified: Secondary | ICD-10-CM | POA: Diagnosis not present

## 2011-08-24 DIAGNOSIS — Z79899 Other long term (current) drug therapy: Secondary | ICD-10-CM

## 2011-08-24 DIAGNOSIS — Z01811 Encounter for preprocedural respiratory examination: Secondary | ICD-10-CM

## 2011-08-24 DIAGNOSIS — M25551 Pain in right hip: Secondary | ICD-10-CM | POA: Diagnosis present

## 2011-08-24 DIAGNOSIS — M1990 Unspecified osteoarthritis, unspecified site: Secondary | ICD-10-CM | POA: Diagnosis present

## 2011-08-24 DIAGNOSIS — M1610 Unilateral primary osteoarthritis, unspecified hip: Principal | ICD-10-CM | POA: Diagnosis present

## 2011-08-24 DIAGNOSIS — E785 Hyperlipidemia, unspecified: Secondary | ICD-10-CM | POA: Diagnosis present

## 2011-08-24 DIAGNOSIS — Z87891 Personal history of nicotine dependence: Secondary | ICD-10-CM

## 2011-08-24 HISTORY — DX: Diverticulum of esophagus, acquired: K22.5

## 2011-08-24 HISTORY — PX: TOTAL HIP ARTHROPLASTY: SHX124

## 2011-08-24 LAB — HEMOGLOBIN A1C: Hgb A1c MFr Bld: 6.3 % — ABNORMAL HIGH (ref <5.7)

## 2011-08-24 SURGERY — ARTHROPLASTY, HIP, TOTAL,POSTERIOR APPROACH
Anesthesia: General | Site: Hip | Laterality: Right | Wound class: Clean

## 2011-08-24 MED ORDER — HYDROCODONE-ACETAMINOPHEN 5-325 MG PO TABS
1.0000 | ORAL_TABLET | ORAL | Status: DC | PRN
  Administered 2011-08-25 – 2011-08-27 (×6): 2 via ORAL
  Filled 2011-08-24 (×6): qty 2

## 2011-08-24 MED ORDER — DOCUSATE SODIUM 100 MG PO CAPS
100.0000 mg | ORAL_CAPSULE | Freq: Two times a day (BID) | ORAL | Status: DC
  Administered 2011-08-24 – 2011-08-25 (×3): 100 mg via ORAL
  Filled 2011-08-24 (×8): qty 1

## 2011-08-24 MED ORDER — LACTATED RINGERS IV SOLN
INTRAVENOUS | Status: DC
  Administered 2011-08-24 (×2): via INTRAVENOUS

## 2011-08-24 MED ORDER — MENTHOL 3 MG MT LOZG
1.0000 | LOZENGE | OROMUCOSAL | Status: DC | PRN
  Administered 2011-08-25: 3 mg via ORAL
  Filled 2011-08-24: qty 9

## 2011-08-24 MED ORDER — FOSINOPRIL SODIUM 20 MG PO TABS: 40.0000 mg | ORAL_TABLET | Freq: Every day | ORAL | Status: DC

## 2011-08-24 MED ORDER — METOCLOPRAMIDE HCL 10 MG PO TABS: 5.0000 mg | ORAL_TABLET | Freq: Three times a day (TID) | ORAL | Status: DC | PRN

## 2011-08-24 MED ORDER — NEOSTIGMINE METHYLSULFATE 1 MG/ML IJ SOLN
INTRAMUSCULAR | Status: DC | PRN
  Administered 2011-08-24: 3 mg via INTRAVENOUS

## 2011-08-24 MED ORDER — ONDANSETRON HCL 4 MG/2ML IJ SOLN: 4.0000 mg | Freq: Four times a day (QID) | INTRAMUSCULAR | Status: DC | PRN

## 2011-08-24 MED ORDER — AMMONIUM LACTATE 12 % EX LOTN
1.0000 "application " | TOPICAL_LOTION | Freq: Every day | CUTANEOUS | Status: DC
  Administered 2011-08-25 – 2011-08-27 (×3): 1 via TOPICAL
  Filled 2011-08-24: qty 400

## 2011-08-24 MED ORDER — ASPIRIN EC 81 MG PO TBEC
81.0000 mg | DELAYED_RELEASE_TABLET | Freq: Every day | ORAL | Status: DC
  Administered 2011-08-24 – 2011-08-27 (×3): 81 mg via ORAL
  Filled 2011-08-24 (×4): qty 1

## 2011-08-24 MED ORDER — EPHEDRINE SULFATE 50 MG/ML IJ SOLN
INTRAMUSCULAR | Status: DC | PRN
  Administered 2011-08-24 (×2): 15 mg via INTRAVENOUS

## 2011-08-24 MED ORDER — FENTANYL CITRATE 0.05 MG/ML IJ SOLN
INTRAMUSCULAR | Status: DC | PRN
  Administered 2011-08-24: 150 ug via INTRAVENOUS
  Administered 2011-08-24 (×2): 50 ug via INTRAVENOUS

## 2011-08-24 MED ORDER — HYDROMORPHONE HCL PF 1 MG/ML IJ SOLN
INTRAMUSCULAR | Status: AC
  Filled 2011-08-24: qty 1

## 2011-08-24 MED ORDER — CEFAZOLIN SODIUM 1-5 GM-% IV SOLN
1.0000 g | Freq: Four times a day (QID) | INTRAVENOUS | Status: AC
  Administered 2011-08-24 – 2011-08-25 (×3): 1 g via INTRAVENOUS
  Filled 2011-08-24 (×3): qty 50

## 2011-08-24 MED ORDER — CEFAZOLIN SODIUM 1-5 GM-% IV SOLN
INTRAVENOUS | Status: AC
  Filled 2011-08-24: qty 50

## 2011-08-24 MED ORDER — INSULIN ASPART 100 UNIT/ML ~~LOC~~ SOLN
0.0000 [IU] | Freq: Three times a day (TID) | SUBCUTANEOUS | Status: DC
  Filled 2011-08-24: qty 3

## 2011-08-24 MED ORDER — VECURONIUM BROMIDE 10 MG IV SOLR
INTRAVENOUS | Status: DC | PRN
  Administered 2011-08-24: 5 mg via INTRAVENOUS
  Administered 2011-08-24: 2 mg via INTRAVENOUS

## 2011-08-24 MED ORDER — ATORVASTATIN CALCIUM 40 MG PO TABS
40.0000 mg | ORAL_TABLET | Freq: Every day | ORAL | Status: DC
  Administered 2011-08-24 – 2011-08-26 (×3): 40 mg via ORAL
  Filled 2011-08-24 (×4): qty 1

## 2011-08-24 MED ORDER — ACETAMINOPHEN 325 MG PO TABS: 650.0000 mg | ORAL_TABLET | Freq: Four times a day (QID) | ORAL | Status: DC | PRN

## 2011-08-24 MED ORDER — BISACODYL 5 MG PO TBEC
10.0000 mg | DELAYED_RELEASE_TABLET | Freq: Every day | ORAL | Status: DC
  Administered 2011-08-24 – 2011-08-26 (×3): 10 mg via ORAL
  Filled 2011-08-24 (×3): qty 2

## 2011-08-24 MED ORDER — POVIDONE-IODINE 7.5 % EX SOLN
Freq: Once | CUTANEOUS | Status: AC
  Filled 2011-08-24: qty 118

## 2011-08-24 MED ORDER — ONDANSETRON HCL 4 MG/2ML IJ SOLN: 4.0000 mg | Freq: Once | INTRAMUSCULAR | Status: DC | PRN

## 2011-08-24 MED ORDER — OXYCODONE-ACETAMINOPHEN 5-325 MG PO TABS: 1.0000 | ORAL_TABLET | ORAL | Status: DC | PRN

## 2011-08-24 MED ORDER — ENOXAPARIN SODIUM 40 MG/0.4ML ~~LOC~~ SOLN
40.0000 mg | Freq: Every day | SUBCUTANEOUS | Status: DC
  Administered 2011-08-24 – 2011-08-26 (×3): 40 mg via SUBCUTANEOUS
  Filled 2011-08-24 (×4): qty 0.4

## 2011-08-24 MED ORDER — VITAMIN C 500 MG PO TABS
500.0000 mg | ORAL_TABLET | Freq: Every day | ORAL | Status: DC
  Administered 2011-08-24 – 2011-08-27 (×4): 500 mg via ORAL
  Filled 2011-08-24 (×4): qty 1

## 2011-08-24 MED ORDER — PHENOL 1.4 % MT LIQD: 1.0000 | OROMUCOSAL | Status: DC | PRN

## 2011-08-24 MED ORDER — INSULIN ASPART 100 UNIT/ML ~~LOC~~ SOLN: 0.0000 [IU] | Freq: Every day | SUBCUTANEOUS | Status: DC

## 2011-08-24 MED ORDER — LACTATED RINGERS IV SOLN: INTRAVENOUS | Status: DC

## 2011-08-24 MED ORDER — HYDROMORPHONE HCL PF 1 MG/ML IJ SOLN
0.2500 mg | INTRAMUSCULAR | Status: DC | PRN
  Administered 2011-08-24 (×5): 0.5 mg via INTRAVENOUS

## 2011-08-24 MED ORDER — HYDROMORPHONE HCL PF 1 MG/ML IJ SOLN
0.5000 mg | INTRAMUSCULAR | Status: DC | PRN
  Administered 2011-08-24 – 2011-08-26 (×5): 1 mg via INTRAVENOUS
  Filled 2011-08-24 (×5): qty 1

## 2011-08-24 MED ORDER — LISINOPRIL 40 MG PO TABS
40.0000 mg | ORAL_TABLET | Freq: Every day | ORAL | Status: DC
  Administered 2011-08-25 – 2011-08-27 (×3): 40 mg via ORAL
  Filled 2011-08-24 (×4): qty 1

## 2011-08-24 MED ORDER — ACETAMINOPHEN 650 MG RE SUPP: 650.0000 mg | Freq: Four times a day (QID) | RECTAL | Status: DC | PRN

## 2011-08-24 MED ORDER — ONDANSETRON HCL 4 MG/2ML IJ SOLN
INTRAMUSCULAR | Status: DC | PRN
  Administered 2011-08-24: 4 mg via INTRAVENOUS

## 2011-08-24 MED ORDER — POTASSIUM CHLORIDE IN NACL 20-0.9 MEQ/L-% IV SOLN
INTRAVENOUS | Status: DC
  Administered 2011-08-24: via INTRAVENOUS
  Filled 2011-08-24 (×7): qty 1000

## 2011-08-24 MED ORDER — GLYCOPYRROLATE 0.2 MG/ML IJ SOLN
INTRAMUSCULAR | Status: DC | PRN
  Administered 2011-08-24: .4 mg via INTRAVENOUS

## 2011-08-24 MED ORDER — VITAMIN D3 25 MCG (1000 UNIT) PO TABS
1000.0000 [IU] | ORAL_TABLET | Freq: Every day | ORAL | Status: DC
  Administered 2011-08-24 – 2011-08-27 (×4): 1000 [IU] via ORAL
  Filled 2011-08-24 (×4): qty 1

## 2011-08-24 MED ORDER — METOCLOPRAMIDE HCL 5 MG/ML IJ SOLN: 5.0000 mg | Freq: Three times a day (TID) | INTRAMUSCULAR | Status: DC | PRN

## 2011-08-24 MED ORDER — TOPIRAMATE 25 MG PO TABS
25.0000 mg | ORAL_TABLET | Freq: Every day | ORAL | Status: DC
  Administered 2011-08-24 – 2011-08-27 (×4): 25 mg via ORAL
  Filled 2011-08-24 (×4): qty 1

## 2011-08-24 MED ORDER — LORATADINE 10 MG PO TABS
10.0000 mg | ORAL_TABLET | Freq: Every day | ORAL | Status: DC
  Administered 2011-08-24 – 2011-08-27 (×4): 10 mg via ORAL
  Filled 2011-08-24 (×4): qty 1

## 2011-08-24 MED ORDER — SODIUM CHLORIDE 0.9 % IR SOLN
Status: DC | PRN
  Administered 2011-08-24: 1000 mL

## 2011-08-24 MED ORDER — ONDANSETRON HCL 4 MG PO TABS: 4.0000 mg | ORAL_TABLET | Freq: Four times a day (QID) | ORAL | Status: DC | PRN

## 2011-08-24 MED ORDER — PROPOFOL 10 MG/ML IV BOLUS
INTRAVENOUS | Status: DC | PRN
  Administered 2011-08-24: 130 mg via INTRAVENOUS

## 2011-08-24 SURGICAL SUPPLY — 59 items
BLADE SAW SAG 73X25 THK (BLADE) ×1
BLADE SAW SGTL 73X25 THK (BLADE) ×1 IMPLANT
BRUSH FEMORAL CANAL (MISCELLANEOUS) IMPLANT
CLOTH BEACON ORANGE TIMEOUT ST (SAFETY) ×2 IMPLANT
COVER BACK TABLE 24X17X13 BIG (DRAPES) IMPLANT
COVER PROBE W GEL 5X96 (DRAPES) IMPLANT
COVER SURGICAL LIGHT HANDLE (MISCELLANEOUS) ×4 IMPLANT
DRAPE INCISE IOBAN 66X45 STRL (DRAPES) IMPLANT
DRAPE ORTHO SPLIT 77X108 STRL (DRAPES) ×2
DRAPE SURG ORHT 6 SPLT 77X108 (DRAPES) ×2 IMPLANT
DRAPE U-SHAPE 47X51 STRL (DRAPES) ×2 IMPLANT
DRILL BIT 7/64X5 (BIT) ×2 IMPLANT
DRSG MEPILEX BORDER 4X12 (GAUZE/BANDAGES/DRESSINGS) ×2 IMPLANT
DRSG MEPILEX BORDER 4X8 (GAUZE/BANDAGES/DRESSINGS) IMPLANT
DURAPREP 26ML APPLICATOR (WOUND CARE) ×2 IMPLANT
ELECT BLADE 6.5 EXT (BLADE) ×2 IMPLANT
ELECT CAUTERY BLADE 6.4 (BLADE) ×2 IMPLANT
ELECT REM PT RETURN 9FT ADLT (ELECTROSURGICAL) ×2
ELECTRODE REM PT RTRN 9FT ADLT (ELECTROSURGICAL) ×1 IMPLANT
EVACUATOR 1/8 PVC DRAIN (DRAIN) IMPLANT
GAUZE XEROFORM 5X9 LF (GAUZE/BANDAGES/DRESSINGS) ×2 IMPLANT
GLOVE BIO SURGEON STRL SZ7 (GLOVE) ×2 IMPLANT
GLOVE BIOGEL PI IND STRL 7.0 (GLOVE) ×1 IMPLANT
GLOVE BIOGEL PI IND STRL 7.5 (GLOVE) ×2 IMPLANT
GLOVE BIOGEL PI INDICATOR 7.0 (GLOVE) ×1
GLOVE BIOGEL PI INDICATOR 7.5 (GLOVE) ×2
GLOVE SS BIOGEL STRL SZ 7.5 (GLOVE) ×1 IMPLANT
GLOVE SUPERSENSE BIOGEL SZ 7.5 (GLOVE) ×1
GOWN PREVENTION PLUS XLARGE (GOWN DISPOSABLE) ×6 IMPLANT
HANDPIECE INTERPULSE COAX TIP (DISPOSABLE)
HOOD PEEL AWAY FACE SHEILD DIS (HOOD) ×4 IMPLANT
KIT BASIN OR (CUSTOM PROCEDURE TRAY) ×2 IMPLANT
KIT ROOM TURNOVER OR (KITS) ×2 IMPLANT
MANIFOLD NEPTUNE II (INSTRUMENTS) ×2 IMPLANT
NEEDLE 22X1 1/2 (OR ONLY) (NEEDLE) IMPLANT
NS IRRIG 1000ML POUR BTL (IV SOLUTION) ×2 IMPLANT
PACK TOTAL JOINT (CUSTOM PROCEDURE TRAY) ×2 IMPLANT
PAD ARMBOARD 7.5X6 YLW CONV (MISCELLANEOUS) ×4 IMPLANT
PASSER SUT SWANSON 36MM LOOP (INSTRUMENTS) ×2 IMPLANT
PILLOW ABDUCTION HIP (SOFTGOODS) ×2 IMPLANT
PRESSURIZER FEMORAL UNIV (MISCELLANEOUS) IMPLANT
SET HNDPC FAN SPRY TIP SCT (DISPOSABLE) IMPLANT
SPONGE LAP 18X18 X RAY DECT (DISPOSABLE) IMPLANT
STAPLER VISISTAT 35W (STAPLE) IMPLANT
SUCTION FRAZIER TIP 10 FR DISP (SUCTIONS) ×2 IMPLANT
SUT ETHIBOND NAB CT1 #1 30IN (SUTURE) ×8 IMPLANT
SUT MNCRL AB 3-0 PS2 18 (SUTURE) ×2 IMPLANT
SUT VIC AB 0 CT1 27 (SUTURE) ×2
SUT VIC AB 0 CT1 27XBRD ANBCTR (SUTURE) ×2 IMPLANT
SUT VIC AB 1 CT1 27 (SUTURE)
SUT VIC AB 1 CT1 27XBRD ANBCTR (SUTURE) IMPLANT
SUT VIC AB 2-0 CT1 27 (SUTURE) ×3
SUT VIC AB 2-0 CT1 TAPERPNT 27 (SUTURE) ×3 IMPLANT
SYR CONTROL 10ML LL (SYRINGE) ×2 IMPLANT
TOWEL OR 17X24 6PK STRL BLUE (TOWEL DISPOSABLE) ×2 IMPLANT
TOWEL OR 17X26 10 PK STRL BLUE (TOWEL DISPOSABLE) ×2 IMPLANT
TOWER CARTRIDGE SMART MIX (DISPOSABLE) IMPLANT
TRAY FOLEY CATH 14FR (SET/KITS/TRAYS/PACK) ×2 IMPLANT
WATER STERILE IRR 1000ML POUR (IV SOLUTION) ×8 IMPLANT

## 2011-08-24 NOTE — Plan of Care (Signed)
Problem: Consults Goal: Diagnosis- Total Joint Replacement Primary Total Hip     

## 2011-08-24 NOTE — H&P (View-Only) (Signed)
Darius Castro is an 78 y.o. male.   Chief Complaint: Right hip DJD HPI: Darius Castro is seen for follow-up for his persistent right hip pain with degenerative joint disease. We saw him 5 weeks ago and placed him on Mobic with minimal relief. He had a previous hip injection a number of months ago with temporary relief. He's having significant persistent pain and cannot walk any distances without significant pain.  The excruciating pain is getting progressively worse, limiting activities of daily living, poses a significant fall risk, and has failed multiple conservative treatments including intraarticular cortisone injections, medication and home physical therapy.  Past Medical History  Diagnosis Date  . Diabetes mellitus   . Hyperlipidemia   . Hypertension   . Arthritis   . Sleep apnea   . Adenomatous colon polyp 3/09  . Personal history of prostate cancer     Past Surgical History  Procedure Date  . Lumbar laminectomy 1998  . Tonsillectomy   . Knee arthroscopy 2001  . Rotator cuff repair 2/10    right  . Cataract extraction     bilateral    Family History  Problem Relation Age of Onset  . Heart disease Father   . Heart attack Father   . Colon polyps Daughter   . Colon cancer Neg Hx   . Hypertension Mother    Social History:  reports that he quit smoking about 50 years ago. He does not have any smokeless tobacco history on file. He reports that he does not drink alcohol. His drug history not on file.  Allergies: No Known Allergies  Medications Prior to Admission  Medication Sig Dispense Refill  . ammonium lactate (LAC-HYDRIN) 12 % lotion Apply 1 application topically daily.      . Ascorbic Acid (VITAMIN C) 500 MG tablet Take 500 mg by mouth daily.        Marland Kitchen aspirin 325 MG tablet Take 325 mg by mouth daily.        Marland Kitchen atorvastatin (LIPITOR) 40 MG tablet Take 40 mg by mouth daily.      . cetirizine (ZYRTEC) 10 MG tablet Take 10 mg by mouth daily.        . cholecalciferol (VITAMIN  D) 1000 UNITS tablet Take 1,000 Units by mouth daily.        . fosinopril (MONOPRIL) 40 MG tablet Take 40 mg by mouth daily.      Marland Kitchen HYDROcodone-acetaminophen (NORCO) 5-325 MG per tablet Take 1 tablet by mouth daily.      Marland Kitchen topiramate (TOPAMAX) 25 MG tablet Take 25 mg by mouth daily.       No current facility-administered medications on file as of 08/11/2011.    No results found for this or any previous visit (from the past 48 hour(s)). No results found.  Review of Systems  Constitutional: Negative.   HENT: Negative.   Eyes: Negative.   Respiratory: Negative.   Cardiovascular: Negative.   Gastrointestinal: Negative.   Genitourinary: Negative.   Musculoskeletal: Positive for joint pain.       Right hip pain  Skin: Negative.   Neurological: Negative.   Endo/Heme/Allergies: Negative.   Psychiatric/Behavioral: Negative.     Blood pressure 154/79, pulse 65, temperature 98.8 F (37.1 C), temperature source Oral, height 5\' 7"  (1.702 m), weight 71.215 kg (157 lb), SpO2 96.00%. Physical Exam  Constitutional: He is oriented to person, place, and time. He appears well-developed and well-nourished.  HENT:  Head: Normocephalic and atraumatic.  Mouth/Throat: Oropharynx is  clear and moist.  Eyes: Conjunctivae and EOM are normal. Pupils are equal, round, and reactive to light.  Neck: Neck supple.  Cardiovascular: Normal rate and regular rhythm.   Respiratory: Effort normal.  GI: Soft. Bowel sounds are normal.  Genitourinary:       Not pertinent to current symptomatology therefore not examined.  Musculoskeletal: He exhibits tenderness.       Pt is independently ambulatory with a moderately antalgic gait and the assistance of a cane.  Pelvis is level. Examination of his right hip reveals flexion to 90 extension to 0, internal and external rotation of 10 degrees with pain. Exam of the left hip reveals full range of motion without pain. Vascular exam: pulses 2+ and symmetric.  Neurological: He  is alert and oriented to person, place, and time.  Skin: Skin is warm and dry.  Psychiatric: He has a normal mood and affect. His behavior is normal. Judgment and thought content normal.    Xray: AP Pelvis, AP and LAT right hip show joint space narrowing, with subchondral cyst formation and acetabular spurs.  Assessment Patient Active Problem List  Diagnoses  . DIABETES MELLITUS, TYPE II  . HYPERLIPIDEMIA  . HYPERTENSION  . PROSTATE CANCER, HX OF  . COLONIC POLYPS, HX OF  . Hip pain, right  . Osteoarthritis  . Syncope    Plan I talk to him in detail about his right hip. He does want to proceed with right total hip replacement due to intractable pain and significant limitation of motion. Discussed risks benefits and possible complications of the surgery in detail and he understands this completely. He has been cleared preoperatively by Dr. Cato Mulligan, Dr. Tenny Craw and we'll obtain Dr. Valda Lamb records from urology even though he's retired he was to be set up to see his replacement in June.  Kandice Schmelter J 08/11/2011, 3:39 PM

## 2011-08-24 NOTE — Transfer of Care (Signed)
Immediate Anesthesia Transfer of Care Note  Patient: Darius Castro  Procedure(s) Performed: Procedure(s) (LRB): TOTAL HIP ARTHROPLASTY (Right)  Patient Location: PACU  Anesthesia Type: General  Level of Consciousness: awake, alert  and oriented  Airway & Oxygen Therapy: Patient Spontanous Breathing and Patient connected to nasal cannula oxygen  Post-op Assessment: Report given to PACU RN, Post -op Vital signs reviewed and stable and Patient moving all extremities X 4  Post vital signs: Reviewed and stable  Complications: No apparent anesthesia complications

## 2011-08-24 NOTE — Anesthesia Postprocedure Evaluation (Signed)
  Anesthesia Post-op Note  Patient: Darius Castro  Procedure(s) Performed: Procedure(s) (LRB): TOTAL HIP ARTHROPLASTY (Right)  Patient Location: PACU  Anesthesia Type: General  Level of Consciousness: awake, alert , oriented and patient cooperative  Airway and Oxygen Therapy: Patient Spontanous Breathing and Patient connected to nasal cannula oxygen  Post-op Pain: mild  Post-op Assessment: Post-op Vital signs reviewed, Patient's Cardiovascular Status Stable, Respiratory Function Stable, Patent Airway, No signs of Nausea or vomiting and Pain level controlled  Post-op Vital Signs: stable  Complications: No apparent anesthesia complications

## 2011-08-24 NOTE — Preoperative (Signed)
Beta Blockers   Reason not to administer Beta Blockers:Not Applicable 

## 2011-08-24 NOTE — Anesthesia Preprocedure Evaluation (Signed)
Anesthesia Evaluation  Patient identified by MRN, date of birth, ID band Patient awake    Reviewed: Allergy & Precautions, H&P , NPO status , Patient's Chart, lab work & pertinent test results  Airway Mallampati: I TM Distance: >3 FB Neck ROM: full    Dental  (+) Teeth Intact   Pulmonary sleep apnea , pneumonia ,          Cardiovascular hypertension, Rhythm:regular Rate:Normal     Neuro/Psych    GI/Hepatic   Endo/Other  Diabetes mellitus-  Renal/GU      Musculoskeletal   Abdominal   Peds  Hematology   Anesthesia Other Findings   Reproductive/Obstetrics                           Anesthesia Physical Anesthesia Plan  ASA: II  Anesthesia Plan: General ETT   Post-op Pain Management:    Induction: Intravenous  Airway Management Planned: Oral ETT  Additional Equipment:   Intra-op Plan:   Post-operative Plan: Extubation in OR  Informed Consent: I have reviewed the patients History and Physical, chart, labs and discussed the procedure including the risks, benefits and alternatives for the proposed anesthesia with the patient or authorized representative who has indicated his/her understanding and acceptance.     Plan Discussed with: Anesthesiologist, CRNA and Surgeon  Anesthesia Plan Comments:         Anesthesia Quick Evaluation

## 2011-08-24 NOTE — Anesthesia Procedure Notes (Signed)
Procedure Name: Intubation Date/Time: 08/24/2011 9:15 AM Performed by: Rosita Fire Pre-anesthesia Checklist: Patient identified, Emergency Drugs available, Suction available and Patient being monitored Patient Re-evaluated:Patient Re-evaluated prior to inductionOxygen Delivery Method: Circle system utilized Preoxygenation: Pre-oxygenation with 100% oxygen Intubation Type: IV induction Ventilation: Mask ventilation without difficulty Laryngoscope Size: Miller and 2 Grade View: Grade I Tube size: 7.5 mm Number of attempts: 1 Placement Confirmation: ETT inserted through vocal cords under direct vision and positive ETCO2 Secured at: 23 cm Tube secured with: Tape Dental Injury: Teeth and Oropharynx as per pre-operative assessment

## 2011-08-24 NOTE — Interval H&P Note (Signed)
History and Physical Interval Note:  08/24/2011 9:07 AM  Darius Castro  has presented today for surgery, with the diagnosis of DJD RIGHT HIP  The various methods of treatment have been discussed with the patient and family. After consideration of risks, benefits and other options for treatment, the patient has consented to  Procedure(s) (LRB): TOTAL HIP ARTHROPLASTY (Right) as a surgical intervention .  The patients' history has been reviewed, patient examined, no change in status, stable for surgery.  I have reviewed the patients' chart and labs.  Questions were answered to the patient's satisfaction.     Salvatore Marvel A

## 2011-08-25 LAB — BASIC METABOLIC PANEL
BUN: 22 mg/dL (ref 6–23)
Calcium: 8.8 mg/dL (ref 8.4–10.5)
Creatinine, Ser: 1.27 mg/dL (ref 0.50–1.35)
GFR calc Af Amer: 58 mL/min — ABNORMAL LOW (ref >90)
GFR calc non Af Amer: 50 mL/min — ABNORMAL LOW (ref >90)
Glucose, Bld: 148 mg/dL — ABNORMAL HIGH (ref 70–99)
Potassium: 4.1 mEq/L (ref 3.5–5.1)

## 2011-08-25 LAB — CBC
HCT: 32.1 % — ABNORMAL LOW (ref 39.0–52.0)
Hemoglobin: 10.8 g/dL — ABNORMAL LOW (ref 13.0–17.0)
MCH: 31.5 pg (ref 26.0–34.0)
MCHC: 33.6 g/dL (ref 30.0–36.0)
MCV: 93.6 fL (ref 78.0–100.0)
RDW: 12.9 % (ref 11.5–15.5)

## 2011-08-25 MED ORDER — SODIUM CHLORIDE 0.9 % IV BOLUS (SEPSIS)
500.0000 mL | Freq: Once | INTRAVENOUS | Status: AC
  Administered 2011-08-25: 500 mL via INTRAVENOUS

## 2011-08-25 NOTE — Progress Notes (Signed)
Patient ID: Darius Castro, male   DOB: 1923/12/08, 78 y.o.   MRN: [id number] PATIENT ID: Darius Castro  MRN: [id number]  DOB/AGE:  Feb 20, 1924 / 78 y.o.  1 Day Post-Op Procedure(s) (LRB): TOTAL HIP ARTHROPLASTY (Right)    PROGRESS NOTE Subjective: Patient is alert, oriented,no Nausea, no Vomiting, yes passing gas, no Bowel Movement. Taking PO yes. Denies SOB, Chest or Calf Pain. Using Incentive Spirometer, PAS in place. Ambulate not yet Patient reports pain as 7 on 0-10 scale  .    Objective: Vital signs in last 24 hours: Filed Vitals:   08/24/11 1656 08/24/11 2137 08/25/11 0202 08/25/11 0648  BP: 115/50 110/52 125/53 124/54  Pulse: 77 80 75 74  Temp: 98 F (36.7 C) 98.1 F (36.7 C) 98.6 F (37 C) 98.2 F (36.8 C)  TempSrc: Oral Oral Oral   Resp: 18 18 18 18   SpO2: 100% 97% 96% 97%      Intake/Output from previous day: I/O last 3 completed shifts: In: 2180 [P.O.:480; I.V.:1700] Out: 750 [Urine:500; Blood:250]   Intake/Output this shift:     LABORATORY DATA:  Basename 08/25/11 0452  WBC 14.1*  HGB 10.8*  HCT 32.1*  PLT 218  NA --  K --  CL --  CO2 --  BUN --  CREATININE --  GLUCOSE --  GLUCAP --  INR --  CALCIUM --    Examination: Neurologically intact ABD soft Neurovascular intact Sensation intact distally Intact pulses distally Dorsiflexion/Plantar flexion intact Incision: dressing C/D/I} XR AP&Lat of hip shows well placed\fixed THA  Assessment:   1 Day Post-Op Procedure(s) (LRB): TOTAL HIP ARTHROPLASTY (Right) ADDITIONAL DIAGNOSIS:  Acute Blood Loss Anemia, Diabetes and Hypertension  Plan: PT/OT WBAT, THA  posterior precautions  DVT Prophylaxis: Lovenox\  DISCHARGE PLAN: Skilled Nursing Facility/Rehab  DISCHARGE NEEDS: pt requesting [facility].  Currently independently living resident there

## 2011-08-25 NOTE — Op Note (Signed)
NAME:  Darius Castro, Darius Castro                  ACCOUNT NO.:  [id number]  MEDICAL RECORD NO.:  [id number]  LOCATION:  5013                         FACILITY:  [facility]  PHYSICIAN:  Dian Minahan A. Thurston Hole, M.D. DATE OF BIRTH:  10-18-1923  DATE OF PROCEDURE: DATE OF DISCHARGE:  08/24/2011                              OPERATIVE REPORT   PREOPERATIVE DIAGNOSIS:  Right hip degenerative joint disease.  POSTOPERATIVE DIAGNOSIS:  Right hip degenerative joint disease.  PROCEDURE:  Right total hip replacement using DePuy press-fit total hip system with #6 Summit press-fit femoral stem with 1.5 x 36 mm femoral head with acetabulum 56-mm pinnacle press-fit acetabulum with 2 locking screws and polyethylene liner.  SURGEON:  Elana Alm. Thurston Hole, MD  ASSISTANT:  Julien Girt, PA-C  ANESTHESIA:  General.  OPERATIVE TIME:  1 hour 10 minutes.  ESTIMATED BLOOD LOSS:  300 mL.  COMPLICATIONS:  None.  DESCRIPTION OF PROCEDURE:  Mr. Quevedo was brought to the operating room on August 24, 2011 and placed on the operative table in supine position. After being placed under general anesthesia, he had a Foley catheter placed under sterile conditions.  He received antibiotics preoperatively as well.  His right hip was examined.  He had flexion to 90, extension to 10 degrees, internal rotation of -20, external rotation of 10.  Leg lengths were approximately equal.  He was then turned to the right lateral decubitus position and secured on the bed with a Stulberg frame. His right hip and leg was prepped using sterile DuraPrep and draped using sterile technique.  Time-out procedure was called and the correct right hip identified.  Initially, through a 15-cm posterolateral greater trochanteric incision, an initial incision was made.  The underlying subcutaneous tissues were incised along with the skin incision.  The iliotibial band and gluteus maximus fascia was incised longitudinally revealing the underlying sciatic nerve,  which was carefully protected. The short external rotators of the hip and hip capsule were released off their femoral neck insertions intact and then the hip was then posteriorly dislocated.  He was found to have grade 3 and 4 DJD of the femoral head.  Femoral neck cut was then made 1.5-2 cm above the lesser trochanter in the appropriate manner of anteversion and inclination. Sequential retractors were then placed carefully around the acetabulum. Degenerative acetabular labrum was removed.  The acetabulum was found to have grade 3 and 4 DJD as well.  Sequential acetabular reaming was carried out in the appropriate manner of anteversion, abduction, and inclination up to a 55-mm size and then a 56-mm acetabular trial was hammered into position with an excellent fit.  It was then removed and the actual 56-mm pinnacle acetabular cup was hammered into position with an excellent fit in the appropriate manner of anteversion, abduction, and inclination.  It was further secured in place with 2 locking screws, 1 in the 12 o'clock and 1 in the 11 o'clock position, each of these were 20 mm in length.  After this was done, the 10-degree polyethylene liner was then placed and locked into position with a 10-degree lip in the posterior lateral position.  At this point, proximal femur was then exposed.  Sequential  reaming was carried out up to a #6 size and broaching up to a #6 size and with a #6 broach in place and a 1.5 x 36 mm hip ball trial was placed on the femoral neck and then the hip reduced, found to be stable up to 80 degrees of internal rotation in both neutral and 30 degrees of adduction and also stable in abduction, external rotation and leg lengths were equal.  At this point, the femoral trial was removed, the femoral canal irrigated, and then the actual #6 Summit femoral stem was hammered into position with an excellent press fit and then the 1.5-mm x 36-mm hip ball was tapped onto the  femoral neck with Morse taper fit and then the hip again reduced, again taken through range of motion and found to be stable with leg lengths equal.  At this point, it was felt that all the components were of excellent size, fit, and stability with excellent restoration of leg length equality and excellent stability.  The wound was then irrigated and then the short external rotators of the hip and hip capsule were reattached to their femoral neck insertion through 2 drill holes in the greater trochanter.  The iliotibial band and gluteus maximus fascia was closed with #1 Ethibond suture, subcutaneous tissues closed with 0 and 2- 0 Vicryl, and subcuticular layer closed with 4-0 Monocryl.  Sterile dressings were applied and an abduction pillow applied and then the patient turned supine, checked for leg lengths which were equal, rotation equal, pulses 2+ and symmetric.  He was then awakened, extubated, and taken to recovery room in stable condition.  Needle and sponge counts correct x2 at the end of the case.  Neurovascular status normal.  Pulses 2+ and symmetric.     Denise Washburn A. Thurston Hole, M.D.     RAW/MEDQ  D:  08/24/2011  T:  08/25/2011  Job:  [id number]

## 2011-08-25 NOTE — Progress Notes (Signed)
Physical Therapy Evaluation  Past Medical History  Diagnosis Date  . Diabetes mellitus   . Hyperlipidemia     takes Lipitor daily  . Adenomatous colon polyp 3/09  . Personal history of prostate cancer   . Dysphagia     with pills  . Hypertension     takes Fosinopril daily  . Sleep apnea     sleep study done in 03/30/04 in epic;doesn't use a CPAP  . Pneumonia     hx of as a child  . Arthritis     back and hip  . Dizziness     pt states when he is hot and stands up too fast  . Back pain     hx of ruptured disc  . Dry skin   . Hemorrhoid     internal   . Hx of colonic polyps   . Nocturia   . Cancer     prostate   . Impaired hearing    Past Surgical History  Procedure Date  . Lumbar laminectomy 1998  . Knee arthroscopy 2001  . Rotator cuff repair 2/10    right  . Cataract extraction     bilateral  . Tonsillectomy     as a child  . Colonoscopy   . Radioactive seed implant 2008    08/25/11 0800  PT Visit Information  Last PT Received On 08/25/11  Patient Stated Goals  Goal #1 Return to Independent Living  Precautions  Precautions Posterior Hip  Precaution Booklet Issued Yes (comment)  Required Braces or Orthoses No  Restrictions  Weight Bearing Restrictions Yes  RLE Weight Bearing PWB  RLE Partial Weight Bearing Percentage or Pounds 50%  Home Living  Lives With Alone  Type of Home Independent living facility  Home Layout One level  Bathroom Shower/Tub Walk-in shower  Bathroom Toilet Handicapped height  Home Adaptive Equipment Walker - rolling;Bedside commode/3-in-1;Built-in shower seat;Grab bars around toilet;Grab bars in shower  Prior Function  Level of Independence Independent with basic ADLs;Independent with homemaking with ambulation;Independent with gait;Independent with transfers;Requires assistive device for independence  Able to Take Stairs? (With A only)  Driving Yes  Cognition  Orientation Level Oriented X4  Bed Mobility  Bed Mobility Yes    Supine to Sit 4: Min assist;With rails  Supine to Sit Details (indicate cue type and reason) A with R LE only.  Cues for sequencing, use of UEs  Sitting - Scoot to Edge of Bed 4: Min assist  Sitting - Scoot to Edge of Bed Details (indicate cue type and reason) A with R LE only.    Transfers  Transfers Yes  Sit to Stand 4: Min assist;With upper extremity assist;From bed  Sit to Stand Details (indicate cue type and reason) Cues for positioning of LEs, use of UEs  Stand to Sit 4: Min assist;With upper extremity assist;With armrests;To chair/3-in-1  Stand to Sit Details cues for use of UEs, control descent, positioning of LEs.    Ambulation/Gait  Ambulation/Gait Yes  Ambulation/Gait Assistance 4: Min assist  Ambulation/Gait Assistance Details (indicate cue type and reason) cues for sequencing, use of RW  Ambulation Distance (Feet) 50 Feet  Assistive device Rolling walker  Gait Pattern Step-to pattern;Decreased stride length;Trunk flexed  Stairs No  Wheelchair Mobility  Wheelchair Mobility No  Posture/Postural Control  Posture/Postural Control No significant limitations  Balance  Balance Assessed No  RLE Assessment  RLE Assessment X  RLE Strength  RLE Overall Strength Comments Strength limited by pain  LLE  Assessment  LLE Assessment [facility]  Exercises  Exercises Total Joint  Total Joint Exercises  Ankle Circles/Pumps AROM;Both;10 reps  Quad Sets AROM;Both;10 reps  PT - End of Session  Equipment Utilized During Treatment Gait belt  Activity Tolerance Patient tolerated treatment well  Patient left in chair;with call bell in reach  Nurse Communication Mobility status for transfers;Mobility status for ambulation  General  Behavior During Session [facility] for tasks performed  Cognition [facility] for tasks performed  PT Assessment  Clinical Impression Statement pt presents s/p THA.  pt very motivated and moving well.  pt lives at [facility] and plans to return to SNF  there.    PT Recommendation/Assessment Patient will need skilled PT in the acute care venue  PT Problem List Decreased strength;Decreased activity tolerance;Decreased balance;Decreased mobility;Decreased knowledge of use of DME;Decreased knowledge of precautions;Pain  Barriers to Discharge None  PT Therapy Diagnosis  Abnormality of gait;Acute pain  PT Plan  PT Frequency 7X/week  PT Treatment/Interventions DME instruction;Gait training;Stair training;Functional mobility training;Therapeutic activities;Therapeutic exercise;Balance training;Patient/family education  PT Recommendation  Follow Up Recommendations Skilled nursing facility  Equipment Recommended Defer to next venue  Individuals Consulted  Consulted and Agree with Results and Recommendations Patient  Acute Rehab PT Goals  PT Goal Formulation With patient  Time For Goal Achievement 2 weeks  Pt will go Supine/Side to Sit with modified independence  PT Goal: Supine/Side to Sit - Progress Goal set today  Pt will go Sit to Supine/Side with modified independence  PT Goal: Sit to Supine/Side - Progress Goal set today  Pt will go Sit to Stand with modified independence  PT Goal: Sit to Stand - Progress Goal set today  Pt will Ambulate >150 feet;with modified independence;with rolling walker  PT Goal: Ambulate - Progress Goal set today  Pt will Perform Home Exercise Program Independently  PT Goal: Perform Home Exercise Program - Progress Goal set today  Additional Goals  Additional Goal #1 pt will verbalize 3/3 hip precautions.    PT Goal: Additional Goal #1 - Progress Goal set today    Mack Hook, PT [phone]

## 2011-08-25 NOTE — Progress Notes (Signed)
CARE MANAGEMENT NOTE 08/25/2011    Action/Plan:   Discharge planning. Social Worker informed Sports coach that patient is from a SNF and will return at discharge.   Anticipated DC Date:  08/27/2011   Anticipated DC Plan:  SKILLED NURSING FACILITY  In-house referral  Clinical Social Worker      DC Planning Services  CM consult      [facility] Choice  NA   Choice offered to / List presented to:  NA   DME arranged  NA      DME agency  NA     HH arranged  NA      HH agency  NA   Status of service:  Completed, signed off  Discharge Disposition:  SKILLED NURSING FACILITY

## 2011-08-26 ENCOUNTER — Inpatient Hospital Stay (HOSPITAL_COMMUNITY): Payer: Medicare Other

## 2011-08-26 ENCOUNTER — Encounter (HOSPITAL_COMMUNITY): Payer: Self-pay | Admitting: Orthopedic Surgery

## 2011-08-26 LAB — BASIC METABOLIC PANEL
Calcium: 8.5 mg/dL (ref 8.4–10.5)
GFR calc Af Amer: 54 mL/min — ABNORMAL LOW (ref >90)
GFR calc non Af Amer: 47 mL/min — ABNORMAL LOW (ref >90)
Glucose, Bld: 124 mg/dL — ABNORMAL HIGH (ref 70–99)
Potassium: 4.3 mEq/L (ref 3.5–5.1)
Sodium: 134 mEq/L — ABNORMAL LOW (ref 135–145)

## 2011-08-26 LAB — CBC
Hemoglobin: 9 g/dL — ABNORMAL LOW (ref 13.0–17.0)
MCH: 30.3 pg (ref 26.0–34.0)
MCHC: 32.8 g/dL (ref 30.0–36.0)
RDW: 13 % (ref 11.5–15.5)

## 2011-08-26 MED ORDER — BISACODYL 10 MG RE SUPP
10.0000 mg | Freq: Once | RECTAL | Status: AC
  Administered 2011-08-26: 10 mg via RECTAL
  Filled 2011-08-26: qty 1

## 2011-08-26 MED ORDER — PANTOPRAZOLE SODIUM 40 MG PO TBEC
40.0000 mg | DELAYED_RELEASE_TABLET | Freq: Two times a day (BID) | ORAL | Status: DC
  Administered 2011-08-27: 40 mg via ORAL
  Filled 2011-08-26 (×2): qty 1

## 2011-08-26 MED ORDER — SODIUM CHLORIDE 0.9 % IV BOLUS (SEPSIS)
500.0000 mL | Freq: Once | INTRAVENOUS | Status: AC
  Administered 2011-08-26: 500 mL via INTRAVENOUS

## 2011-08-26 MED ORDER — FOOD THICKENER (THICKENUP CLEAR)
ORAL | Status: DC | PRN
  Filled 2011-08-26: qty 120

## 2011-08-26 MED ORDER — METOCLOPRAMIDE HCL 5 MG/ML IJ SOLN
5.0000 mg | Freq: Three times a day (TID) | INTRAMUSCULAR | Status: DC
  Filled 2011-08-26 (×4): qty 2

## 2011-08-26 MED ORDER — POTASSIUM CHLORIDE IN NACL 20-0.9 MEQ/L-% IV SOLN
INTRAVENOUS | Status: DC
  Administered 2011-08-26 – 2011-08-27 (×2): via INTRAVENOUS
  Filled 2011-08-26 (×4): qty 1000

## 2011-08-26 MED ORDER — METOCLOPRAMIDE HCL 10 MG PO TABS
10.0000 mg | ORAL_TABLET | Freq: Three times a day (TID) | ORAL | Status: DC
  Administered 2011-08-26 (×2): 10 mg via ORAL
  Filled 2011-08-26 (×6): qty 1

## 2011-08-26 NOTE — Progress Notes (Signed)
CSW was referred to Darius Castro to assist with SNF placement. Darius Castro's wife Santina Evans was at bedside during CSW visit.  Darius Castro and wife live at [facility] in [place]. Darius Castro is agreeable to going to skilled nursing level of care at [facility] CSW will f/u to contact [facility] re: dc plans. Frederico Hamman, LCSW [phone]

## 2011-08-26 NOTE — Progress Notes (Signed)
Patient ID: Darius Castro, male   DOB: 04-23-1924, 78 y.o.   MRN: [id number] PATIENT ID: Darius Castro  MRN: [id number]  DOB/AGE:  18-Jul-1923 / 78 y.o.  2 Days Post-Op Procedure(s) (LRB): TOTAL HIP ARTHROPLASTY (Right)    PROGRESS NOTE Subjective: Patient is alert, oriented,noNausea, no Vomiting, yes passing gas, no Bowel Movement. Taking PO difficulty swallowing solid food.  Patient states he has had a small esophagus for years Denies SOB, Chest or Calf Pain. Using Incentive Spirometer, PAS in place. Ambulate minimall Patient reports pain as 5 on 0-10 scale  .    Objective: Vital signs in last 24 hours: Filed Vitals:   08/25/11 0648 08/25/11 1335 08/25/11 2104 08/26/11 0635  BP: 124/54 116/58 114/57 175/75  Pulse: 74 89 82 87  Temp: 98.2 F (36.8 C) 98.3 F (36.8 C) 99.4 F (37.4 C) 98 F (36.7 C)  TempSrc:  Oral Oral   Resp: 18 18 18 20   SpO2: 97% 92%  93%      Intake/Output from previous day: I/O last 3 completed shifts: In: 840 [P.O.:840] Out: 1000 [Urine:1000]   Intake/Output this shift:     LABORATORY DATA:  Basename 08/26/11 0553 08/25/11 0452  WBC 17.0* 14.1*  HGB 9.0* 10.8*  HCT 27.4* 32.1*  PLT 201 218  NA 134* 139  K 4.3 4.1  CL 103 104  CO2 24 24  BUN 27* 22  CREATININE 1.34 1.27  GLUCOSE 124* 148*  GLUCAP -- --  INR -- --  CALCIUM 8.5 --    Examination: ABD soft Neurovascular intact Sensation intact distally Intact pulses distally Dorsiflexion/Plantar flexion intact Incision: scant drainage} XR AP&Lat of hip shows well placed\fixed THA  Assessment:   2 Days Post-Op Procedure(s) (LRB): TOTAL HIP ARTHROPLASTY (Right) ADDITIONAL DIAGNOSIS:  Acute Blood Loss Anemia, Hypertension and dysphagia, leukocytosis  Plan: PT/OT 50-75% WB, THA  posterior precautions  DVT Prophylaxis: Lovenox  DISCHARGE PLAN: Skilled Nursing Facility/Rehab  TODAYS NEEDS: dulcolax suppository.  Change diet to nectar thick dysphagia diet, add reglan, KUB, add  protonix

## 2011-08-26 NOTE — Progress Notes (Signed)
Physical Therapy Note   08/26/11 0900  PT Visit Information  Last PT Received On 08/26/11  Precautions  Precautions Posterior Hip  Restrictions  Weight Bearing Restrictions Yes  RLE Weight Bearing PWB  RLE Partial Weight Bearing Percentage or Pounds 50  Bed Mobility  Bed Mobility Yes  Supine to Sit With rails;3: Mod assist  Supine to Sit Details (indicate cue type and reason) A with R LE and trunk.  pt requiring increased A today and cues for UE use, sequencing.    Sitting - Scoot to [place] of Bed 4: Min assist  Sitting - Scoot to [place] of Bed Details (indicate cue type and reason) A with R LE  Transfers  Transfers Yes  Sit to Stand 4: Min assist;With upper extremity assist;From bed  Sit to Stand Details (indicate cue type and reason) cues for UE use, anterior wt shift  Stand to Sit 4: Min assist;With upper extremity assist;With armrests;To chair/3-in-1  Stand to Sit Details cues for use of UEs, control descent, LE positioning  Ambulation/Gait  Ambulation/Gait Yes  Ambulation/Gait Assistance 4: Min assist  Ambulation/Gait Assistance Details (indicate cue type and reason) cues for sequencing, upright posture.  pt seems more fatigued today and moving slower.    Ambulation Distance (Feet) 20 Feet (20, 15)  Assistive device Rolling walker  Gait Pattern Step-to pattern;Decreased stride length;Trunk flexed  Stairs No  Wheelchair Mobility  Wheelchair Mobility No  Posture/Postural Control  Posture/Postural Control No significant limitations  Balance  Balance Assessed No  Total Joint Exercises  Ankle Circles/Pumps AROM;Both;10 reps  Quad Sets AROM;Both;10 reps  PT - End of Session  Equipment Utilized During Treatment Gait belt  Activity Tolerance Patient limited by fatigue  Patient left in chair;with call bell in reach  Nurse Communication Mobility status for transfers;Mobility status for ambulation  General  Behavior During Session [facility] for tasks performed  Cognition [facility] for tasks  performed  PT - Assessment/Plan  Comments on Treatment Session pt presents s/p THA.  pt more fatigued today and coughing up yellow sputum throughout session.  RN aware and spoke with PA about ST Eval as pt reports history of difficulty swallowing and notes he has told his PCP, but nothing has been done about it.  Concern about aspiration as pt notes he couldn't eat his breakfast because it was getting him "choked".    PT Plan Discharge plan remains appropriate;Frequency remains appropriate  PT Frequency 7X/week  Follow Up Recommendations Skilled nursing facility  Equipment Recommended Defer to next venue  Acute Rehab PT Goals  PT Goal: Supine/Side to Sit - Progress Progressing toward goal  PT Goal: Sit to Supine/Side - Progress Progressing toward goal  PT Goal: Sit to Stand - Progress Progressing toward goal  PT Goal: Ambulate - Progress Progressing toward goal  PT Goal: Perform Home Exercise Program - Progress Progressing toward goal  Additional Goals  PT Goal: Additional Goal #1 - Progress Progressing toward goal    Mack Hook, PT [phone]

## 2011-08-26 NOTE — Progress Notes (Signed)
OT Cancellation Note  Treatment cancelled today due to pt. off the floor at a procedure.  Daveah Varone, OTR/L Pager [phone] 08/26/2011, 1:53 PM

## 2011-08-26 NOTE — Procedures (Signed)
Modified Barium Swallow Procedure Note Patient Details  Name: Darius Castro MRN: [id number] Date of Birth: [birth year]  Today's Date: 08/26/2011 Time:  -     Past Medical History:  Past Medical History  Diagnosis Date  . Diabetes mellitus   . Hyperlipidemia     takes Lipitor daily  . Adenomatous colon polyp 3/09  . Personal history of prostate cancer   . Dysphagia     with pills  . Hypertension     takes Fosinopril daily  . Sleep apnea     sleep study done in 03/30/04 in epic;doesn't use a CPAP  . Pneumonia     hx of as a child  . Arthritis     back and hip  . Dizziness     pt states when he is hot and stands up too fast  . Back pain     hx of ruptured disc  . Dry skin   . Hemorrhoid     internal   . Hx of colonic polyps   . Nocturia   . Cancer     prostate   . Impaired hearing    Past Surgical History:  Past Surgical History  Procedure Date  . Lumbar laminectomy 1998  . Knee arthroscopy 2001  . Rotator cuff repair 2/10    right  . Cataract extraction     bilateral  . Tonsillectomy     as a child  . Colonoscopy   . Radioactive seed implant 2008  . Total hip arthroplasty 08/24/2011    Procedure: TOTAL HIP ARTHROPLASTY;  Surgeon: Nilda Simmer, MD;  Location: [facility] OR;  Service: Orthopedics;  Laterality: Right;  DR Anne Ng 90 MINUTES FOR SURGERY   HPI:  Pt is an 78 year old man admitted with worsening right hip pain, underwent total hip arthroplasty on 08/25/11. Pt reports to staff that he has had trouble swallowing for years with "narrow esophagus" but has never been treated. Rn reports pt has been coughing up foods, has difficutly taking pills.      Recommendation/Prognosis  Clinical Impression Dysphagia Diagnosis: Severe cervical esophageal phase dysphagia Clinical impression:  Mr. Eble presents with a severe cervical esophageal dysphagia secondary to moderate-sized Zenker's diverticulum at C5-6, confirmed by radiologist (Dr. Molli Posey). Pt experiences pooling  of all POs in diverticulum with backflow of residuals to pharynx. Gross silent aspiration of thin liquids with no compensatory strategies (head turns etc.) successful in aiding functional transit of bolus/resdiuals. Function somewhat improved with nectar thick liquids due to heavier bolus though suspect with regular intake, aspiration risk is still moderate to high. Less than 10% of pureed bolus passed to esophgus with majoirty remaining in diverticum/pyriform sinuses. Though this is a chronic dysphagia the pt has likely compensated for dysphagia/aspiration prior to hip surgery. Now with increased weakness and decreased mobility pt will be less likely to tolerate aspiration. Recommend pt become NPO with consideration for short term alternate nutrition with a referral to ENT for assessment of Zenker's. If pt is to d/c home and needs traditional nutrition with known risk, consider a liquid diet thickened to nectar consistency. SLP will continue to follow for education regarding thickening liquids if needed. (Pt unable to take whole pills, they will lodge in Zenkers, Crushed pills also likely to pool in pharynx with risk of aspiration/expectoration post swallow. Consider alternate means for necessary meds).   Swallow Evaluation Recommendations Recommended Consults: Consider ENT evaluation General Recommendation: NPO Medication Administration: Via alternative means (Pills lodge in zenkers  causing gross aspiration of liquids) Oral Care Recommendations: Oral care QID Other Recommendations: Order thickener from pharmacy Prognosis Barriers to Reach Goals: Severity of dysphagia    SLP Goals  SLP Swallowing Goals Goal #3: Pt will participate in education regarding thickening liquids and aspiration precautions as needed.  Harlon Ditty, MA CCC-SLP [phone]   Claudine Mouton 08/26/2011, 3:21 PM

## 2011-08-27 ENCOUNTER — Encounter (HOSPITAL_COMMUNITY): Payer: Self-pay | Admitting: Physician Assistant

## 2011-08-27 DIAGNOSIS — K225 Diverticulum of esophagus, acquired: Secondary | ICD-10-CM

## 2011-08-27 HISTORY — DX: Diverticulum of esophagus, acquired: K22.5

## 2011-08-27 LAB — BASIC METABOLIC PANEL
CO2: 22 mEq/L (ref 19–32)
Calcium: 8.2 mg/dL — ABNORMAL LOW (ref 8.4–10.5)
GFR calc non Af Amer: 66 mL/min — ABNORMAL LOW (ref >90)
Potassium: 4.4 mEq/L (ref 3.5–5.1)
Sodium: 135 mEq/L (ref 135–145)

## 2011-08-27 LAB — CBC
MCH: 31.3 pg (ref 26.0–34.0)
MCHC: 34.3 g/dL (ref 30.0–36.0)
Platelets: 205 10*3/uL (ref 150–400)
RBC: 2.97 MIL/uL — ABNORMAL LOW (ref 4.22–5.81)

## 2011-08-27 MED ORDER — PHENOL 1.4 % MT LIQD: 1.0000 | OROMUCOSAL | Status: DC | PRN

## 2011-08-27 MED ORDER — ASPIRIN 81 MG PO TBEC: 81.0000 mg | DELAYED_RELEASE_TABLET | Freq: Every day | ORAL | Status: DC

## 2011-08-27 MED ORDER — DSS 100 MG PO CAPS: 100.0000 mg | ORAL_CAPSULE | Freq: Two times a day (BID) | ORAL | Status: AC

## 2011-08-27 MED ORDER — WHITE PETROLATUM GEL
Status: AC
  Filled 2011-08-27: qty 5

## 2011-08-27 MED ORDER — FOOD THICKENER (THICKENUP CLEAR): ORAL | Status: DC

## 2011-08-27 MED ORDER — HYDROCODONE-ACETAMINOPHEN 5-325 MG PO TABS: 1.0000 | ORAL_TABLET | Freq: Four times a day (QID) | ORAL | Status: DC | PRN

## 2011-08-27 MED ORDER — ENOXAPARIN SODIUM 40 MG/0.4ML ~~LOC~~ SOLN: 40.0000 mg | Freq: Every day | SUBCUTANEOUS | Status: DC

## 2011-08-27 NOTE — Discharge Summary (Signed)
Physician Discharge Summary  Patient ID: Darius Castro MRN: [id number] DOB/AGE: [birth year] 78 y.o.  Admit date: 08/24/2011 Discharge date: 08/27/2011  Admission Diagnoses: Past Medical History  Diagnosis Date  . Diabetes mellitus   . Hyperlipidemia     takes Lipitor daily  . Adenomatous colon polyp 3/09  . Personal history of prostate cancer   . Dysphagia     with pills  . Hypertension     takes Fosinopril daily  . Sleep apnea     sleep study done in 03/30/04 in epic;doesn't use a CPAP  . Pneumonia     hx of as a child  . Arthritis     back and hip  . Dizziness     pt states when he is hot and stands up too fast  . Back pain     hx of ruptured disc  . Dry skin   . Hemorrhoid     internal   . Hx of colonic polyps   . Nocturia   . Cancer     prostate   . Impaired hearing   . Zenker's hypopharyngeal diverticulum 08/27/2011   Discharge Diagnoses:  Principal Problem:  *Hip pain, right Active Problems:  DIABETES MELLITUS, TYPE II  HYPERTENSION  Osteoarthritis  Zenker's hypopharyngeal diverticulum   Discharged Condition: good  Hospital Course: This patient underwent a right total hip replacement on 08/24/2011 by Dr. Thurston Hole. Postoperatively in the recovery room he did well postop day 1 patient had some difficulty with leukocytosis and some hypotension he was given fluid boluses x2 he was afebrile so we chose to monitor his leukocytosis conservatively.  postop day 1 he was bed to chair with physical therapy postop day 2 patient had significant difficulty swallowing breakfast and swallowing his pills. A speech swallowing evaluation was ordered and modified barium swallow was done. The speech therapist recommended that he be seen by an ear nose and throat physician for what she thought was a Zenker's diverticulum. The patient states he has had difficulty swallowing pills all his life he states "I have had a small esophagus all my life and difficulty swallowing anything date. It just  happens to be worse after the surgery. I spoke with Dr. Annalee Genta over the and on he states since this patient has had this Zenker's diverticulum his whole life he does not recommend hospital intervention at this time. He will see this patient on an outpatient basis. He did request that we order a formal barium swallow study. This was done and did document the Zenker's diverticulum. The speech therapist worked with the patient placed him on a full liquid diet with nectar thick. She also taught him on swallowing techniques of tucking his chin to prevent aspiration. Because side of the Zenker's diverticulum he is at risk for aspiration and will need to be watched closely. We feel the Zenker's diverticulum has been there a long time however the expected daily exacerbation is due to irritation from his intubation from his recent surgery.  On postop day 2 patient had a KUB that did show an early ileus. He was started on Reglan and bowel management. He has since had 2 regular bowel movements and is doing well his ileus has resolved. Postop day 3 he is being discharged to skilled nursing facility he is 50-75% weightbearing. He is on posterior total hip per cautions. He is on a full liquid nectar thick diet. He will need to followup with Dr. Annalee Genta eye in 1 week for followup of his  Zenker's diverticulum. He will need to see Dr. Wyline Mood in the office in 2 weeks postoperatively for a wound check. He will need daily physical therapy daily occupational therapy and daily speech therapy while at skilled nursing  Consults: speech therapy swallowing eval, Dr Annalee Genta ENT  Significant Diagnostic Studies:  Results for orders placed during the hospital encounter of 08/24/11 (from the past 72 hour(s))  HEMOGLOBIN A1C     Status: Abnormal   Collection Time   08/24/11  1:50 PM      Component Value Range Comment   Hemoglobin A1C 6.3 (*) <5.7 (%)    Mean Plasma Glucose 134 (*) <117 (mg/dL)   CBC     Status: Abnormal    Collection Time   08/25/11  4:52 AM      Component Value Range Comment   WBC 14.1 (*) 4.0 - 10.5 (K/uL)    RBC 3.43 (*) 4.22 - 5.81 (MIL/uL)    Hemoglobin 10.8 (*) 13.0 - 17.0 (g/dL)    HCT 09.8 (*) 11.9 - 52.0 (%)    MCV 93.6  78.0 - 100.0 (fL)    MCH 31.5  26.0 - 34.0 (pg)    MCHC 33.6  30.0 - 36.0 (g/dL)    RDW 14.7  82.9 - 56.2 (%)    Platelets 218  150 - 400 (K/uL)   BASIC METABOLIC PANEL     Status: Abnormal   Collection Time   08/25/11  4:52 AM      Component Value Range Comment   Sodium 139  135 - 145 (mEq/L)    Potassium 4.1  3.5 - 5.1 (mEq/L)    Chloride 104  96 - 112 (mEq/L)    CO2 24  19 - 32 (mEq/L)    Glucose, Bld 148 (*) 70 - 99 (mg/dL)    BUN 22  6 - 23 (mg/dL)    Creatinine, Ser 1.30  0.50 - 1.35 (mg/dL)    Calcium 8.8  8.4 - 10.5 (mg/dL)    GFR calc non Af Amer 50 (*) >90 (mL/min)    GFR calc Af Amer 58 (*) >90 (mL/min)   CBC     Status: Abnormal   Collection Time   08/26/11  5:53 AM      Component Value Range Comment   WBC 17.0 (*) 4.0 - 10.5 (K/uL)    RBC 2.97 (*) 4.22 - 5.81 (MIL/uL)    Hemoglobin 9.0 (*) 13.0 - 17.0 (g/dL)    HCT 86.5 (*) 78.4 - 52.0 (%)    MCV 92.3  78.0 - 100.0 (fL)    MCH 30.3  26.0 - 34.0 (pg)    MCHC 32.8  30.0 - 36.0 (g/dL)    RDW 69.6  29.5 - 28.4 (%)    Platelets 201  150 - 400 (K/uL)   BASIC METABOLIC PANEL     Status: Abnormal   Collection Time   08/26/11  5:53 AM      Component Value Range Comment   Sodium 134 (*) 135 - 145 (mEq/L)    Potassium 4.3  3.5 - 5.1 (mEq/L)    Chloride 103  96 - 112 (mEq/L)    CO2 24  19 - 32 (mEq/L)    Glucose, Bld 124 (*) 70 - 99 (mg/dL)    BUN 27 (*) 6 - 23 (mg/dL)    Creatinine, Ser 1.32  0.50 - 1.35 (mg/dL)    Calcium 8.5  8.4 - 10.5 (mg/dL)    GFR calc non Af Denyse Dago  47 (*) >90 (mL/min)    GFR calc Af Amer 54 (*) >90 (mL/min)   CBC     Status: Abnormal   Collection Time   08/27/11  6:15 AM      Component Value Range Comment   WBC 11.6 (*) 4.0 - 10.5 (K/uL)    RBC 2.97 (*) 4.22 - 5.81  (MIL/uL)    Hemoglobin 9.3 (*) 13.0 - 17.0 (g/dL)    HCT 16.1 (*) 09.6 - 52.0 (%)    MCV 91.2  78.0 - 100.0 (fL)    MCH 31.3  26.0 - 34.0 (pg)    MCHC 34.3  30.0 - 36.0 (g/dL)    RDW 04.5  40.9 - 81.1 (%)    Platelets 205  150 - 400 (K/uL)   BASIC METABOLIC PANEL     Status: Abnormal   Collection Time   08/27/11  6:15 AM      Component Value Range Comment   Sodium 135  135 - 145 (mEq/L)    Potassium 4.4  3.5 - 5.1 (mEq/L)    Chloride 103  96 - 112 (mEq/L)    CO2 22  19 - 32 (mEq/L)    Glucose, Bld 132 (*) 70 - 99 (mg/dL)    BUN 28 (*) 6 - 23 (mg/dL)    Creatinine, Ser 9.14  0.50 - 1.35 (mg/dL)    Calcium 8.2 (*) 8.4 - 10.5 (mg/dL)    GFR calc non Af Amer 66 (*) >90 (mL/min)    GFR calc Af Amer 76 (*) >90 (mL/min)     Treatments: IV hydration, antibiotics: Ancef, analgesia: hydrocodone, anticoagulation: LMW heparin, therapies: PT, OT, ST, RN and SW and surgery: right total hip replacement  Discharge Exam: Blood pressure 165/73, pulse 93, temperature 99 F (37.2 C), temperature source Oral, resp. rate 18, height 5\' 8"  (1.727 m), weight 70.6 kg (155 lb 10.3 oz), SpO2 93.00%. General appearance: alert, cooperative and appears stated age Head: Normocephalic, without obvious abnormality, atraumatic Skin: Skin color, texture, turgor normal. No rashes or lesions or very dry skin...lac hytrin needs to be applied daily Neurologic: Grossly normal Incision/Wound: well approximated scant drainage  Disposition: stable  Discharge Orders    Future Orders Please Complete By Expires   Diet - low sodium heart healthy      Call MD / Call 911      Comments:   If you experience chest pain or shortness of breath, CALL 911 and be transported to the hospital emergency room.  If you develope a fever above 101 F, pus (white drainage) or increased drainage or redness at the wound, or calf pain, call your surgeon's office.   Constipation Prevention      Comments:   Drink plenty of fluids.  Prune juice  may be helpful.  You may use a stool softener, such as Colace (over the counter) 100 mg twice a day.  Use MiraLax (over the counter) for constipation as needed.   Increase activity slowly as tolerated      Weight Bearing as taught in Physical Therapy      Comments:   Use a walker or crutches as instructed.   Discharge instructions      Comments:   Posterior total hip precautions 50-75% weightbearing.   Follow the hip precautions as taught in Physical Therapy      Comments:   Posterior total hip precautions   Change dressing      Comments:   You may change the dressing daily  with sterile 4 x 4 inch gauze dressing and paper tape.  You may clean the incision with alcohol prior to redressing   TED hose      Comments:   Use stockings (TED hose) for 4 weeks on both leg(s).  You may remove them at night for sleeping.   Discharge diet:      Comments:   Full liquid diet, nectar thick with aspiration precautions.     Medication List  As of 08/27/2011 11:18 AM   STOP taking these medications         aspirin 325 MG tablet         TAKE these medications         ammonium lactate 12 % lotion   Commonly known as: LAC-HYDRIN   Apply 1 application topically daily.      aspirin 81 MG EC tablet   Take 1 tablet (81 mg total) by mouth daily.      atorvastatin 40 MG tablet   Commonly known as: LIPITOR   Take 40 mg by mouth daily.      cetirizine 10 MG tablet   Commonly known as: ZYRTEC   Take 10 mg by mouth daily.      cholecalciferol 1000 UNITS tablet   Commonly known as: VITAMIN D   Take 1,000 Units by mouth daily.      DSS 100 MG Caps   Take 100 mg by mouth 2 (two) times daily.      enoxaparin 40 MG/0.4ML Soln   Commonly known as: LOVENOX   Inject 0.4 mLs (40 mg total) into the skin at bedtime.      food thickener Powd   Commonly known as: RESOURCE THICKENUP CLEAR   Use per label to make full liquid diet nectar thick      fosinopril 40 MG tablet   Commonly known as: MONOPRIL    Take 40 mg by mouth daily.      HYDROcodone-acetaminophen 5-325 MG per tablet   Commonly known as: NORCO   Take 1 tablet by mouth every 6 (six) hours as needed for pain.      phenol 1.4 % Liqd   Commonly known as: CHLORASEPTIC   Use as directed 1 spray in the mouth or throat as needed.      topiramate 25 MG tablet   Commonly known as: TOPAMAX   Take 25 mg by mouth daily.      vitamin C 500 MG tablet   Commonly known as: ASCORBIC ACID   Take 500 mg by mouth daily.           Follow-up Information    Follow up with Nilda Simmer, MD on 09/07/2011. (appt time 3:15)    Contact information:   [facility] [address] [place] [address] [phone]       Follow up with Osborn Coho, MD on 09/07/2011. (appt at 1:40pm)    Contact information:   [address] [place] [address] [phone]          Signed: Pascal Lux 08/27/2011, 11:18 AM

## 2011-08-27 NOTE — Progress Notes (Signed)
Clinical Social Worker phoned [facility] to inquire if they will be able to offer a bed.  CSW left a voicemail with [place].  CSW to continue to follow and assist as needed.  Angelia Mould, MSW, [place] [phone]

## 2011-08-27 NOTE — Progress Notes (Signed)
Physical Therapy Treatment Patient Details Name: Darius Castro MRN: [id number] DOB: 01-19-1924 Today's Date: 08/27/2011  PT Assessment/Plan  PT - Assessment/Plan Comments on Treatment Session: pt presents s/p THA.  pt improved today and notes he feels a little better.  pt with less coughing and runny nose today, however now hving loose BMs.  pt notes plan is for him to D/C to SNF today.   PT Plan: Discharge plan remains appropriate;Frequency remains appropriate PT Frequency: 7X/week Follow Up Recommendations: Skilled nursing facility Equipment Recommended: Defer to next venue PT Goals  Acute Rehab PT Goals PT Goal: Sit to Stand - Progress: Progressing toward goal PT Goal: Ambulate - Progress: Progressing toward goal Additional Goals PT Goal: Additional Goal #1 - Progress: Progressing toward goal  PT Treatment Precautions/Restrictions  Precautions Precautions: Posterior Hip Precaution Booklet Issued: Yes (comment) Required Braces or Orthoses: No Restrictions Weight Bearing Restrictions: Yes RLE Weight Bearing: Partial weight bearing RLE Partial Weight Bearing Percentage or Pounds: 50 Mobility (including Balance) Bed Mobility Bed Mobility: No Transfers Transfers: Yes Sit to Stand: 4: Min assist;With upper extremity assist;From chair/3-in-1 Sit to Stand Details (indicate cue type and reason): demos good technique Stand to Sit: 4: Min assist;With upper extremity assist;With armrests;To chair/3-in-1 Stand to Sit Details: cues to get closer to chair prior to sitting Ambulation/Gait Ambulation/Gait: Yes Ambulation/Gait Assistance: 4: Min assist Ambulation/Gait Assistance Details (indicate cue type and reason): cues for upright posture, hip precautions with turns.   Ambulation Distance (Feet): 100 Feet Assistive device: Rolling walker Gait Pattern: Step-to pattern;Decreased stride length;Trunk flexed Stairs: No Wheelchair Mobility Wheelchair Mobility: No  Posture/Postural  Control Posture/Postural Control: No significant limitations Balance Balance Assessed: No Exercise    End of Session PT - End of Session Equipment Utilized During Treatment: Gait belt Activity Tolerance: Patient tolerated treatment well Patient left: in chair;with call bell in reach Nurse Communication: Mobility status for transfers;Mobility status for ambulation General Behavior During Session: [facility] for tasks performed Cognition: [facility] for tasks performed  [name], [place] [phone] 08/27/2011, 11:39 AM

## 2011-08-27 NOTE — Progress Notes (Signed)
Speech Language/Pathology Speech Pathology: Dysphagia Treatment Note  Patient was observed with :   Nectar liquids.  Patient was noted to have s/s of aspiration : Yes:  Cough, wet quality  Lung Sounds:  Rhonchi  Temperature: 99.1  Patient required: minimal verbal cues to utilize 3 or more swallows after each sips. SLP provided extensive education regarding diagnosis, diet, thickening liquids and compensatory strategies. Provided written instruction and called pts wife to discuss findings as well.   Clinical Impression: Pt demonstrates overt s/s of aspiration even with thickened liquids, suspect significant aspiration with all PO intake. Concerned that post surgery the pt will not tolerate quantity of aspiration as he must have prior to admit. Pt with plan to d/c to [facility] today.   Recommendations:  Continue full liquid diet thickened to nectar thick consistency. Crush meds. Pt will ned f/u MBS after treatment by ENT.   Pain:   none Intervention Required:   No   Goals: All Goals Met  Harlon Ditty, MA CCC-SLP [phone]

## 2011-08-27 NOTE — Progress Notes (Signed)
OT Note:  OT consult received and appreciated. Noted pt. to D/C to SNF and will defer further OT needs to SNF. Will sign off acutely, thanks!  Cassandria Anger, OTR/L Pager: [phone] 08/27/2011 .

## 2011-08-27 NOTE — Progress Notes (Signed)
Clinical Social Worker received phone call from [facility] indicating that they do have a SNF bed available but that they are unable to access [facility] to obtain pt's clinicals.  CSW agreed to fax information into SNF, including dc summary.  SNF to contact CSW once information received and reviewed.  CSW to continue to follow and assist as needed.  Angelia Mould, MSW, [place] [phone]

## 2011-08-29 ENCOUNTER — Encounter (HOSPITAL_COMMUNITY): Payer: Self-pay | Admitting: Emergency Medicine

## 2011-08-29 ENCOUNTER — Inpatient Hospital Stay (HOSPITAL_COMMUNITY)
Admission: EM | Admit: 2011-08-29 | Discharge: 2011-09-04 | DRG: 195 | Disposition: A | Discharge status: Skilled Nursing Facility | Payer: Medicare Other | Attending: Internal Medicine | Admitting: Internal Medicine

## 2011-08-29 ENCOUNTER — Other Ambulatory Visit: Payer: Self-pay

## 2011-08-29 ENCOUNTER — Emergency Department (HOSPITAL_COMMUNITY): Payer: Medicare Other

## 2011-08-29 DIAGNOSIS — R509 Fever, unspecified: Secondary | ICD-10-CM | POA: Diagnosis present

## 2011-08-29 DIAGNOSIS — E739 Lactose intolerance, unspecified: Secondary | ICD-10-CM | POA: Diagnosis present

## 2011-08-29 DIAGNOSIS — J189 Pneumonia, unspecified organism: Principal | ICD-10-CM | POA: Diagnosis present

## 2011-08-29 DIAGNOSIS — Z96649 Presence of unspecified artificial hip joint: Secondary | ICD-10-CM

## 2011-08-29 DIAGNOSIS — M1990 Unspecified osteoarthritis, unspecified site: Secondary | ICD-10-CM | POA: Diagnosis present

## 2011-08-29 DIAGNOSIS — E119 Type 2 diabetes mellitus without complications: Secondary | ICD-10-CM | POA: Diagnosis present

## 2011-08-29 DIAGNOSIS — R1310 Dysphagia, unspecified: Secondary | ICD-10-CM | POA: Diagnosis present

## 2011-08-29 DIAGNOSIS — K225 Diverticulum of esophagus, acquired: Secondary | ICD-10-CM | POA: Diagnosis present

## 2011-08-29 DIAGNOSIS — E876 Hypokalemia: Secondary | ICD-10-CM | POA: Diagnosis present

## 2011-08-29 DIAGNOSIS — M25551 Pain in right hip: Secondary | ICD-10-CM | POA: Diagnosis present

## 2011-08-29 LAB — DIFFERENTIAL
Basophils Absolute: 0 10*3/uL (ref 0.0–0.1)
Basophils Relative: 0 % (ref 0–1)
Eosinophils Absolute: 0 10*3/uL (ref 0.0–0.7)
Neutro Abs: 5.6 10*3/uL (ref 1.7–7.7)
Neutrophils Relative %: 82 % — ABNORMAL HIGH (ref 43–77)

## 2011-08-29 LAB — URINALYSIS, ROUTINE W REFLEX MICROSCOPIC
Bilirubin Urine: NEGATIVE
Leukocytes, UA: NEGATIVE
Nitrite: NEGATIVE
Specific Gravity, Urine: 1.027 (ref 1.005–1.030)
Urobilinogen, UA: 2 mg/dL — ABNORMAL HIGH (ref 0.0–1.0)
pH: 6 (ref 5.0–8.0)

## 2011-08-29 LAB — COMPREHENSIVE METABOLIC PANEL
AST: 56 U/L — ABNORMAL HIGH (ref 0–37)
Albumin: 2.5 g/dL — ABNORMAL LOW (ref 3.5–5.2)
BUN: 21 mg/dL (ref 6–23)
Calcium: 8.2 mg/dL — ABNORMAL LOW (ref 8.4–10.5)
Creatinine, Ser: 0.86 mg/dL (ref 0.50–1.35)
GFR calc non Af Amer: 77 mL/min — ABNORMAL LOW (ref >90)
Total Bilirubin: 0.7 mg/dL (ref 0.3–1.2)

## 2011-08-29 LAB — CBC
Hemoglobin: 8.5 g/dL — ABNORMAL LOW (ref 13.0–17.0)
MCH: 30.8 pg (ref 26.0–34.0)
MCHC: 34.1 g/dL (ref 30.0–36.0)
Platelets: 241 10*3/uL (ref 150–400)

## 2011-08-29 LAB — PROTIME-INR
INR: 1.19 (ref 0.00–1.49)
Prothrombin Time: 15.4 seconds — ABNORMAL HIGH (ref 11.6–15.2)

## 2011-08-29 MED ORDER — PIPERACILLIN-TAZOBACTAM 3.375 G IVPB
3.3750 g | Freq: Once | INTRAVENOUS | Status: AC
  Administered 2011-08-29: 3.375 g via INTRAVENOUS
  Filled 2011-08-29: qty 50

## 2011-08-29 MED ORDER — VITAMIN D3 25 MCG (1000 UNIT) PO TABS
1000.0000 [IU] | ORAL_TABLET | Freq: Every day | ORAL | Status: DC
  Administered 2011-09-03 – 2011-09-04 (×2): 1000 [IU] via ORAL
  Filled 2011-08-29 (×6): qty 1

## 2011-08-29 MED ORDER — STARCH (THICKENING) PO POWD
ORAL | Status: DC | PRN
  Administered 2011-08-29: 16:00:00 via ORAL
  Filled 2011-08-29: qty 227

## 2011-08-29 MED ORDER — DEXTROSE 5 % IV SOLN
1.0000 g | Freq: Three times a day (TID) | INTRAVENOUS | Status: DC
  Administered 2011-08-29 – 2011-09-01 (×10): 1 g via INTRAVENOUS
  Filled 2011-08-29 (×14): qty 1

## 2011-08-29 MED ORDER — ASPIRIN EC 81 MG PO TBEC
81.0000 mg | DELAYED_RELEASE_TABLET | Freq: Every day | ORAL | Status: DC
  Administered 2011-08-30 – 2011-09-04 (×2): 81 mg via ORAL
  Filled 2011-08-29 (×6): qty 1

## 2011-08-29 MED ORDER — SODIUM CHLORIDE 0.9 % IV SOLN
INTRAVENOUS | Status: DC
  Administered 2011-08-29: 13:00:00 via INTRAVENOUS

## 2011-08-29 MED ORDER — VITAMIN C 500 MG PO TABS
500.0000 mg | ORAL_TABLET | Freq: Every day | ORAL | Status: DC
  Administered 2011-09-03 – 2011-09-04 (×2): 500 mg via ORAL
  Filled 2011-08-29 (×6): qty 1

## 2011-08-29 MED ORDER — POTASSIUM CHLORIDE CRYS ER 20 MEQ PO TBCR
40.0000 meq | EXTENDED_RELEASE_TABLET | Freq: Once | ORAL | Status: AC
  Administered 2011-08-29: 40 meq via ORAL
  Filled 2011-08-29: qty 2

## 2011-08-29 MED ORDER — LEVOFLOXACIN IN D5W 750 MG/150ML IV SOLN
750.0000 mg | INTRAVENOUS | Status: DC
  Administered 2011-08-29 – 2011-09-03 (×6): 750 mg via INTRAVENOUS
  Filled 2011-08-29 (×7): qty 150

## 2011-08-29 MED ORDER — VANCOMYCIN HCL IN DEXTROSE 1-5 GM/200ML-% IV SOLN
1000.0000 mg | Freq: Once | INTRAVENOUS | Status: AC
  Administered 2011-08-29: 1000 mg via INTRAVENOUS
  Filled 2011-08-29: qty 200

## 2011-08-29 MED ORDER — ENOXAPARIN SODIUM 40 MG/0.4ML ~~LOC~~ SOLN
40.0000 mg | SUBCUTANEOUS | Status: DC
  Administered 2011-08-29 – 2011-09-03 (×5): 40 mg via SUBCUTANEOUS
  Filled 2011-08-29 (×7): qty 0.4

## 2011-08-29 MED ORDER — LORATADINE 10 MG PO TABS
10.0000 mg | ORAL_TABLET | Freq: Every day | ORAL | Status: DC
  Administered 2011-09-03 – 2011-09-04 (×2): 10 mg via ORAL
  Filled 2011-08-29 (×6): qty 1

## 2011-08-29 MED ORDER — VANCOMYCIN HCL IN DEXTROSE 1-5 GM/200ML-% IV SOLN
1000.0000 mg | Freq: Two times a day (BID) | INTRAVENOUS | Status: DC
  Administered 2011-08-30 – 2011-09-04 (×10): 1000 mg via INTRAVENOUS
  Filled 2011-08-29 (×13): qty 200

## 2011-08-29 MED ORDER — ATORVASTATIN CALCIUM 40 MG PO TABS
40.0000 mg | ORAL_TABLET | Freq: Every day | ORAL | Status: DC
  Administered 2011-08-30 – 2011-09-04 (×3): 40 mg via ORAL
  Filled 2011-08-29 (×6): qty 1

## 2011-08-29 MED ORDER — DOCUSATE SODIUM 100 MG PO CAPS
100.0000 mg | ORAL_CAPSULE | Freq: Two times a day (BID) | ORAL | Status: DC
  Administered 2011-08-30 – 2011-09-02 (×2): 100 mg via ORAL
  Filled 2011-08-29 (×11): qty 1

## 2011-08-29 MED ORDER — LISINOPRIL 40 MG PO TABS
40.0000 mg | ORAL_TABLET | Freq: Every day | ORAL | Status: DC
  Administered 2011-08-30 – 2011-09-04 (×3): 40 mg via ORAL
  Filled 2011-08-29 (×6): qty 1

## 2011-08-29 MED ORDER — TOPIRAMATE 25 MG PO TABS
25.0000 mg | ORAL_TABLET | Freq: Every day | ORAL | Status: DC
  Administered 2011-08-30 – 2011-09-04 (×3): 25 mg via ORAL
  Filled 2011-08-29 (×6): qty 1

## 2011-08-29 NOTE — H&P (Signed)
Hospital Admission Note Date: 08/29/2011  Patient name: Darius Castro Medical record number: [id number] Date of birth: 1924/03/29 Age: 78 y.o. Gender: male PCP: [name], MD, MD  Attending physician: Altha Harm, MD  Chief Complaint:  History of Present Illness:  Scheduled Meds:   . piperacillin-tazobactam (ZOSYN)  IV  3.375 g Intravenous Once  . potassium chloride  40 mEq Oral Once  . vancomycin  1,000 mg Intravenous Once   Continuous Infusions:   . sodium chloride 20 mL/hr at 08/29/11 1311   PRN Meds:.food thickener Allergies: Review of patient's allergies indicates no known allergies. Past Medical History  Diagnosis Date  . Diabetes mellitus   . Hyperlipidemia     takes Lipitor daily  . Adenomatous colon polyp 3/09  . Personal history of prostate cancer   . Dysphagia     with pills  . Hypertension     takes Fosinopril daily  . Sleep apnea     sleep study done in 03/30/04 in epic;doesn't use a CPAP  . Pneumonia     hx of as a child  . Arthritis     back and hip  . Dizziness     pt states when he is hot and stands up too fast  . Back pain     hx of ruptured disc  . Dry skin   . Hemorrhoid     internal   . Hx of colonic polyps   . Nocturia   . Cancer     prostate   . Impaired hearing   . 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 Lumbar laminectomy 1998  . Knee arthroscopy 2001  . Rotator cuff repair 2/10    right  . Cataract extraction     bilateral  . Tonsillectomy     as a child  . Colonoscopy   . Radioactive seed implant 2008  . Total hip arthroplasty 08/24/2011    Procedure: TOTAL HIP ARTHROPLASTY;  Surgeon: Nilda Simmer, MD;  Location: [facility] OR;  Service: Orthopedics;  Laterality: Right;  DR Thurston Hole WANTS 90 MINUTES FOR SURGERY   Family History  Problem Relation Age of Onset  . Heart disease Father   . Heart attack Father   . Colon polyps Daughter   . Colon cancer Neg Hx   .  Anesthesia problems Neg Hx   . Hypotension Neg Hx   . Malignant hyperthermia Neg Hx   . Pseudochol deficiency Neg Hx   . Hypertension Mother    History   Social History  . Marital Status: Married    Spouse Name: N/A    Number of Children: N/A  . Years of Education: N/A   Occupational History  . Not on file.   Social History Main Topics  . Smoking status: Former Smoker    Quit date: 06/22/1961  . Smokeless tobacco: Not on file  . Alcohol Use: Yes     red wine 3-4 times/wk  . Drug Use: No  . Sexually Active: Not Currently   Other Topics Concern  . Not on file   Social History Narrative  . No narrative on file   Review of Systems: A comprehensive review of systems was negative. Physical Exam: No intake or output data in the 24 hours ending 08/29/11 1720 General: Alert, awake, oriented x3, in no acute distress.  HEENT: [place]/AT PEERL, EOMI Neck: Trachea midline,  no masses, no thyromegal,y no JVD, no carotid bruit OROPHARYNX:  Moist, No exudate/ erythema/lesions.  Heart: Regular rate and rhythm, without murmurs, rubs, gallops, PMI non-displaced, no heaves or thrills on palpation.  Lungs: Clear to auscultation, no wheezing or rhonchi noted. No increased vocal fremitus resonant to percussion  Abdomen: Soft, nontender, nondistended, positive bowel sounds, no masses no hepatosplenomegaly noted..  Neuro: No focal neurological deficits noted cranial nerves II through XII grossly intact. DTRs 2+ bilaterally upper and lower extremities. Strength 5 out of 5 in bilateral upper and lower extremities. Musculoskeletal: No warm swelling or erythema around joints, no spinal tenderness noted. Psychiatric: Patient alert and oriented x3, good insight and cognition, good recent to remote recall. Lymph node survey: No cervical axillary or inguinal lymphadenopathy noted.  Lab results:  Basename 08/29/11 1300 08/27/11 0615  NA 142 135  K 3.1* 4.4  CL 107 103  CO2 28 22  GLUCOSE 126* 132*    BUN 21 28*  CREATININE 0.86 1.01  CALCIUM 8.2* 8.2*  MG -- --  PHOS -- --    Basename 08/29/11 1300  AST 56*  ALT 53  ALKPHOS 52  BILITOT 0.7  PROT 5.7*  ALBUMIN 2.5*   No results found for this basename: LIPASE:2,AMYLASE:2 in the last 72 hours  Basename 08/29/11 1300 08/27/11 0615  WBC 6.9 11.6*  NEUTROABS 5.6 --  HGB 8.5* 9.3*  HCT 24.9* 27.1*  MCV 90.2 91.2  PLT 241 205   No results found for this basename: CKTOTAL:3,CKMB:3,CKMBINDEX:3,TROPONINI:3 in the last 72 hours No components found with this basename: POCBNP:3 No results found for this basename: DDIMER:2 in the last 72 hours No results found for this basename: HGBA1C:2 in the last 72 hours No results found for this basename: CHOL:2,HDL:2,LDLCALC:2,TRIG:2,CHOLHDL:2,LDLDIRECT:2 in the last 72 hours No results found for this basename: TSH,T4TOTAL,FREET3,T3FREE,THYROIDAB in the last 72 hours No results found for this basename: VITAMINB12:2,FOLATE:2,FERRITIN:2,TIBC:2,IRON:2,RETICCTPCT:2 in the last 72 hours Imaging results:  Dg Chest 2 View  08/29/2011  *RADIOLOGY REPORT*  Clinical Data: History of cough and fever.  CHEST - 2 VIEW  Comparison: Multiple priors, most recently 08/26/2011.  Findings: Compared to the recent radiograph from 08/26/2011, there is now an area of ill-defined interstitial prominence and patchy air space disease in the left midlung, and potentially the left lower lobe (retrocardiac region), as well as the medial aspect of the right lower lobe.  Findings are concerning for multilobar pneumonia.  Small bilateral pleural effusions are noted on the lateral projection.  Pulmonary vasculature is normal.  Heart size is within normal limits.  Mediastinal contours are unremarkable. Atherosclerosis of the thoracic aorta.  Marked gaseous distension of the stomach is again noted.  IMPRESSION: 1.  Interval development of patchy interstitial and airspace disease in the left mid lung and lower lobes of the lungs  bilaterally, with small bilateral pleural effusions.  Findings are concerning for developing multilobar pneumonia. 2.  Atherosclerosis. 3.  Persistent gaseous distension of the stomach.  Original Report Authenticated By: Florencia Reasons, M.D.   Dg Chest 2 View  08/26/2011  *RADIOLOGY REPORT*  Clinical Data: Evaluate atelectasis or pneumonia.  CHEST - 2 VIEW  Comparison: 08/20/2011  Findings: Gaseous distention of the stomach and upper abdominal bowel loops underneath the right hemidiaphragm.  Low lung volumes with bibasilar atelectasis.  No effusions.  Heart is normal size. No acute bony abnormality.  IMPRESSION: Low lung volumes with bibasilar atelectasis.  Gaseous distention of the stomach and upper abdominal bowel loops under the right hemidiaphragm.  Original Report Authenticated By: Cyndie Chime, M.D.  Dg Chest 2 View  08/20/2011  *RADIOLOGY REPORT*  Clinical Data: Preop  CHEST - 2 VIEW  Comparison: 07/25/2008  Findings: Cardiomediastinal silhouette is stable.  Mild hyperinflation.  No acute infiltrate or pleural effusion.  No pulmonary edema.  Mild degenerative changes thoracic spine.  IMPRESSION: No active disease.  Original Report Authenticated By: Natasha Mead, M.D.   Dg Hip Complete Right  08/24/2011  *RADIOLOGY REPORT*  Clinical Data: Postop right hip replacement.  PORTABLE PELVIS,RIGHT HIP - COMPLETE 2+ VIEW  Comparison: None.  Findings: Total right hip replacement appears in satisfactory position.  Surrounding lucencies appear to represent gas rather than fracture.  Prostatic seed implants in place.  Degenerative changes lower lumbar spine.  IMPRESSION: Satisfactory position total right hip replacement.  Original Report Authenticated By: Fuller Canada, M.D.   Dg Abd 1 View  08/26/2011  *RADIOLOGY REPORT*  Clinical Data: Right abdominal pain.  ABDOMEN - 1 VIEW  Comparison: One-view pelvis 08/24/2011.  Abdominal CT 04/03/2004.  Findings: The upper abdomen is excluded.  There is mild diffuse  distension of the stomach, small and large bowel.  No bowel wall thickening is identified.  There is no supine evidence of free intraperitoneal air.  Prostatic brachytherapy seeds appear unchanged.  There are stable mild degenerative changes of the lower lumbar spine and left hip.  Patient is status post right hip arthroplasty.  IMPRESSION: Diffuse bowel distension most consistent with ileus.  Correlate clinically.  Original Report Authenticated By: Gerrianne Scale, M.D.   Dg Esophagus  08/26/2011  *RADIOLOGY REPORT*  Clinical Data: History of aspiration.  Probable Zenker's diverticulum noted during modified barium swallow.  ESOPHOGRAM/BARIUM SWALLOW  Technique:  Single contrast examination was performed using thick barium.  Fluoroscopy time:  1.27 minutes.  Comparison:  No priors.  Findings:  Limited images were obtained with the patient swallowing thickened barium liquid.  Initial images of the thorax prior to swallow attempts demonstrated retained barium contrast within a focal outpouching of the proximal third of the esophagus, consistent with a Zenker's diverticulum.  The remainder of the esophagus was otherwise markedly patulous (distal esophagus was several centimeters in diameter). Multiple swallows were observed during fluoroscopy, and the Zenker's diverticulum repeatedly filled and partially emptied.  IMPRESSION: 1.  Limited single contrast barium esophagram confirms the presence of a large Zenker's diverticulum.  Original Report Authenticated By: Florencia Reasons, M.D.   Dg Pelvis Portable  08/24/2011  *RADIOLOGY REPORT*  Clinical Data: Postop right hip replacement.  PORTABLE PELVIS,RIGHT HIP - COMPLETE 2+ VIEW  Comparison: None.  Findings: Total right hip replacement appears in satisfactory position.  Surrounding lucencies appear to represent gas rather than fracture.  Prostatic seed implants in place.  Degenerative changes lower lumbar spine.  IMPRESSION: Satisfactory position total right hip  replacement.  Original Report Authenticated By: Fuller Canada, M.D.   Dg Swallowing Func-no Report  08/26/2011  CLINICAL DATA: dysphagia   FLUOROSCOPY FOR SWALLOWING FUNCTION STUDY:  Fluoroscopy was provided for swallowing function study, which was  administered by a speech pathologist.  Final results and recommendations  from this study are contained within the speech pathology report.      Patient Active Hospital Problem List: Healthcare-associated pneumonia (08/29/2011)   Assessment: Pt presenting with what appears to be multilobar Pneumonia. He has a dysphagia secondary to Zenker's diverticulum and a recent hospitalization, thus will treat for aspiration pneumonia in a healthcare setting.    Fever (08/29/2011)   Assessment: Secondary to Pneumoia   S/P Right hip  Arthroplasty (08/24/2011)   Assessment: Pt is presently in rehab and is to be partial weight bearing (50-75%) on RLE.   Plan: Will consult PT.  Glucose intolerance   Assessment: PT and daughter are adamant that he does not have DM. He had a Hb A1c on last hospitalization that was mildly elevated.  Zenker's Diverticulum (08/29/2011)   Assessment: Pt had an appointment scheduled for Monday with  Dr. Annalee Genta. Will notify Dr. Annalee Genta of patient's admission.   DVT Prophylaxis with Lovenox.  Raynard Mapps A. 08/29/2011, 5:20 PM

## 2011-08-29 NOTE — ED Notes (Signed)
Patient from [facility]. EMS reports pt c/o congestion, fever, nausea and vomiting since Thursday. Pt has had recent Left hip replacement Monday at [facility]

## 2011-08-29 NOTE — ED Provider Notes (Signed)
History     CSN: [id number]  Arrival date & time 08/29/11  1221   First MD Initiated Contact with Patient 08/29/11 1254      Chief Complaint  Patient presents with  . Fever  . Nausea  . Emesis    (Consider location/radiation/quality/duration/timing/severity/associated sxs/prior treatment) Patient is a 78 y.o. male presenting with fever and vomiting. The history is provided by the patient.  Fever Primary symptoms of the febrile illness include fever, cough, shortness of breath and vomiting. Primary symptoms do not include headaches, abdominal pain or rash.  Emesis  Associated symptoms include cough and a fever. Pertinent negatives include no abdominal pain, no chills and no headaches.  pt 5 days post op from right tha. In past 2 days has developed productive cough, fever and sob. ?pna on outpt cxr. Denies gu c/o. Poor po intake, poor appetite. No nvd. No cp. Cough episodic, no specific exacerbating or alleviating factors. Denies hx copd, asthma, chf. No orthopnea or pnd.   Past Medical History  Diagnosis Date  . Diabetes mellitus   . Hyperlipidemia     takes Lipitor daily  . Adenomatous colon polyp 3/09  . Personal history of prostate cancer   . Dysphagia     with pills  . Hypertension     takes Fosinopril daily  . Sleep apnea     sleep study done in 03/30/04 in epic;doesn't use a CPAP  . Pneumonia     hx of as a child  . Arthritis     back and hip  . Dizziness     pt states when he is hot and stands up too fast  . Back pain     hx of ruptured disc  . Dry skin   . Hemorrhoid     internal   . Hx of colonic polyps   . Nocturia   . Cancer     prostate   . Impaired hearing   . 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 Lumbar laminectomy 1998  . Knee arthroscopy 2001  . Rotator cuff repair 2/10    right  . Cataract extraction     bilateral  . Tonsillectomy     as a child  . Colonoscopy   . Radioactive seed  implant 2008  . Total hip arthroplasty 08/24/2011    Procedure: TOTAL HIP ARTHROPLASTY;  Surgeon: Nilda Simmer, MD;  Location: [facility] OR;  Service: Orthopedics;  Laterality: Right;  DR Thurston Hole WANTS 90 MINUTES FOR SURGERY    Family History  Problem Relation Age of Onset  . Heart disease Father   . Heart attack Father   . Colon polyps Daughter   . Colon cancer Neg Hx   . Anesthesia problems Neg Hx   . Hypotension Neg Hx   . Malignant hyperthermia Neg Hx   . Pseudochol deficiency Neg Hx   . Hypertension Mother     History  Substance Use Topics  . Smoking status: Former Smoker    Quit date: 06/22/1961  . Smokeless tobacco: Not on file  . Alcohol Use: Yes     red wine 3-4 times/wk      Review of Systems  Constitutional: Positive for fever. Negative for chills.  HENT: Negative for neck pain.   Eyes: Negative for redness.  Respiratory: Positive for cough and shortness of breath.   Cardiovascular: Negative for chest pain and leg swelling.  Gastrointestinal: Positive for vomiting. Negative for abdominal  pain.  Genitourinary: Negative for flank pain.  Musculoskeletal: Negative for back pain.  Skin: Negative for rash.  Neurological: Negative for headaches.  Hematological: Does not bruise/bleed easily.  Psychiatric/Behavioral: Negative for confusion.    Allergies  Review of patient's allergies indicates no known allergies.  Home Medications   Current Outpatient Rx  Name Route Sig Dispense Refill  . ACETAMINOPHEN 325 MG PO TABS Oral Take 650 mg by mouth every 4 (four) hours as needed. For pain or temp > 100.3    . AMMONIUM LACTATE 12 % EX LOTN Topical Apply 1 application topically daily.    Marland Kitchen VITAMIN C 500 MG PO TABS Oral Take 500 mg by mouth daily.      . ASPIRIN 81 MG PO TBEC Oral Take 1 tablet (81 mg total) by mouth daily.    . ATORVASTATIN CALCIUM 40 MG PO TABS Oral Take 40 mg by mouth daily.    Marland Kitchen CETIRIZINE HCL 10 MG PO TABS Oral Take 10 mg by mouth daily.      Marland Kitchen VITAMIN  D 1000 UNITS PO TABS Oral Take 1,000 Units by mouth daily.      . DSS 100 MG CAPS Oral Take 100 mg by mouth 2 (two) times daily. 60 capsule   . ENOXAPARIN SODIUM 40 MG/0.4ML [place] SOLN Subcutaneous Inject 0.4 mLs (40 mg total) into the skin at bedtime. 30 Syringe 0  . FOOD THICKENER (THICKENUP CLEAR)  Use per label to make full liquid diet nectar thick    . HYDROCODONE-ACETAMINOPHEN 5-325 MG PO TABS Oral Take 1 tablet by mouth every 6 (six) hours as needed for pain. 30 tablet 0  . INSULIN ASPART 100 UNIT/ML [place] SOLN Subcutaneous Inject 0-12 Units into the skin 3 (three) times daily before meals. Sliding scale: 0-200 = 0u; 201-250 = 2u; 251-300 = 4u; 301-350 = 6u; 351-400 = 8u; 401-450 = 10u; >450 = 12u and call MD.    . LEVOFLOXACIN 750 MG PO TABS Oral Take 750 mg by mouth once.    Marland Kitchen LISINOPRIL 40 MG PO TABS Oral Take 40 mg by mouth daily.    Marland Kitchen METRONIDAZOLE 500 MG PO TABS Oral Take 500 mg by mouth once.    Marland Kitchen BOOST GLUCOSE CONTROL PO Oral Take 1 Can by mouth 5 (five) times daily. 8am, 10am, 2pm, 4pm, and 8pm    . PHENOL 1.4 % MT LIQD Mouth/Throat Use as directed 1 spray in the mouth or throat as needed.    Marland Kitchen POTASSIUM CHLORIDE CRYS ER 20 MEQ PO TBCR Oral Take 40 mEq by mouth daily.    . TOPIRAMATE 25 MG PO TABS Oral Take 25 mg by mouth daily.      BP 151/70  Pulse 76  Temp(Src) 99.2 F (37.3 C) (Oral)  Resp 22  SpO2 93%  Physical Exam  Nursing note and vitals reviewed. Constitutional: He is oriented to person, place, and time. He appears well-developed and well-nourished. No distress.  HENT:  Head: Atraumatic.  Nose: Nose normal.  Mouth/Throat: Oropharynx is clear and moist.  Eyes: Pupils are equal, round, and reactive to light.  Neck: Neck supple. No tracheal deviation present.       No stiffness or rigidity  Cardiovascular: Normal rate, regular rhythm, normal heart sounds and intact distal pulses.   Pulmonary/Chest: Effort normal. No accessory muscle usage. No respiratory distress. He  has rales.  Abdominal: Soft. Bowel sounds are normal. He exhibits no distension. There is no tenderness.  Genitourinary:  No cva tenderness  Musculoskeletal: Normal range of motion. He exhibits no edema and no tenderness.       srrgical wound right hip without sign infection. Distal pulses palp  Neurological: He is alert and oriented to person, place, and time.  Skin: Skin is warm and dry. No rash noted.  Psychiatric: He has a normal mood and affect.    ED Course  Procedures (including critical care time)   Labs Reviewed  COMPREHENSIVE METABOLIC PANEL  PROTIME-INR  URINALYSIS, ROUTINE W REFLEX MICROSCOPIC  CBC  DIFFERENTIAL    Results for orders placed during the hospital encounter of 08/29/11  COMPREHENSIVE METABOLIC PANEL      Component Value Range   Sodium 142  135 - 145 (mEq/L)   Potassium 3.1 (*) 3.5 - 5.1 (mEq/L)   Chloride 107  96 - 112 (mEq/L)   CO2 28  19 - 32 (mEq/L)   Glucose, Bld 126 (*) 70 - 99 (mg/dL)   BUN 21  6 - 23 (mg/dL)   Creatinine, Ser 2.95  0.50 - 1.35 (mg/dL)   Calcium 8.2 (*) 8.4 - 10.5 (mg/dL)   Total Protein 5.7 (*) 6.0 - 8.3 (g/dL)   Albumin 2.5 (*) 3.5 - 5.2 (g/dL)   AST 56 (*) 0 - 37 (U/L)   ALT 53  0 - 53 (U/L)   Alkaline Phosphatase 52  39 - 117 (U/L)   Total Bilirubin 0.7  0.3 - 1.2 (mg/dL)   GFR calc non Af Amer 77 (*) >90 (mL/min)   GFR calc Af Amer 89 (*) >90 (mL/min)  PROTIME-INR      Component Value Range   Prothrombin Time 15.4 (*) 11.6 - 15.2 (seconds)   INR 1.19  0.00 - 1.49   CBC      Component Value Range   WBC 6.9  4.0 - 10.5 (K/uL)   RBC 2.76 (*) 4.22 - 5.81 (MIL/uL)   Hemoglobin 8.5 (*) 13.0 - 17.0 (g/dL)   HCT 62.1 (*) 30.8 - 52.0 (%)   MCV 90.2  78.0 - 100.0 (fL)   MCH 30.8  26.0 - 34.0 (pg)   MCHC 34.1  30.0 - 36.0 (g/dL)   RDW 65.7  84.6 - 96.2 (%)   Platelets 241  150 - 400 (K/uL)  DIFFERENTIAL      Component Value Range   Neutrophils Relative 82 (*) 43 - 77 (%)   Neutro Abs 5.6  1.7 - 7.7 (K/uL)    Lymphocytes Relative 11 (*) 12 - 46 (%)   Lymphs Abs 0.8  0.7 - 4.0 (K/uL)   Monocytes Relative 7  3 - 12 (%)   Monocytes Absolute 0.5  0.1 - 1.0 (K/uL)   Eosinophils Relative 0  0 - 5 (%)   Eosinophils Absolute 0.0  0.0 - 0.7 (K/uL)   Basophils Relative 0  0 - 1 (%)   Basophils Absolute 0.0  0.0 - 0.1 (K/uL)   Dg Chest 2 View  08/29/2011  *RADIOLOGY REPORT*  Clinical Data: History of cough and fever.  CHEST - 2 VIEW  Comparison: Multiple priors, most recently 08/26/2011.  Findings: Compared to the recent radiograph from 08/26/2011, there is now an area of ill-defined interstitial prominence and patchy air space disease in the left midlung, and potentially the left lower lobe (retrocardiac region), as well as the medial aspect of the right lower lobe.  Findings are concerning for multilobar pneumonia.  Small bilateral pleural effusions are noted on the lateral projection.  Pulmonary vasculature  is normal.  Heart size is within normal limits.  Mediastinal contours are unremarkable. Atherosclerosis of the thoracic aorta.  Marked gaseous distension of the stomach is again noted.  IMPRESSION: 1.  Interval development of patchy interstitial and airspace disease in the left mid lung and lower lobes of the lungs bilaterally, with small bilateral pleural effusions.  Findings are concerning for developing multilobar pneumonia. 2.  Atherosclerosis. 3.  Persistent gaseous distension of the stomach.  Original Report Authenticated By: Florencia Reasons, M.D.   Dg Chest 2 View  08/26/2011  *RADIOLOGY REPORT*  Clinical Data: Evaluate atelectasis or pneumonia.  CHEST - 2 VIEW  Comparison: 08/20/2011  Findings: Gaseous distention of the stomach and upper abdominal bowel loops underneath the right hemidiaphragm.  Low lung volumes with bibasilar atelectasis.  No effusions.  Heart is normal size. No acute bony abnormality.  IMPRESSION: Low lung volumes with bibasilar atelectasis.  Gaseous distention of the stomach and upper  abdominal bowel loops under the right hemidiaphragm.  Original Report Authenticated By: Cyndie Chime, M.D.   Dg Chest 2 View  08/20/2011  *RADIOLOGY REPORT*  Clinical Data: Preop  CHEST - 2 VIEW  Comparison: 07/25/2008  Findings: Cardiomediastinal silhouette is stable.  Mild hyperinflation.  No acute infiltrate or pleural effusion.  No pulmonary edema.  Mild degenerative changes thoracic spine.  IMPRESSION: No active disease.  Original Report Authenticated By: Natasha Mead, M.D.   Dg Hip Complete Right  08/24/2011  *RADIOLOGY REPORT*  Clinical Data: Postop right hip replacement.  PORTABLE PELVIS,RIGHT HIP - COMPLETE 2+ VIEW  Comparison: None.  Findings: Total right hip replacement appears in satisfactory position.  Surrounding lucencies appear to represent gas rather than fracture.  Prostatic seed implants in place.  Degenerative changes lower lumbar spine.  IMPRESSION: Satisfactory position total right hip replacement.  Original Report Authenticated By: Fuller Canada, M.D.   Dg Abd 1 View  08/26/2011  *RADIOLOGY REPORT*  Clinical Data: Right abdominal pain.  ABDOMEN - 1 VIEW  Comparison: One-view pelvis 08/24/2011.  Abdominal CT 04/03/2004.  Findings: The upper abdomen is excluded.  There is mild diffuse distension of the stomach, small and large bowel.  No bowel wall thickening is identified.  There is no supine evidence of free intraperitoneal air.  Prostatic brachytherapy seeds appear unchanged.  There are stable mild degenerative changes of the lower lumbar spine and left hip.  Patient is status post right hip arthroplasty.  IMPRESSION: Diffuse bowel distension most consistent with ileus.  Correlate clinically.  Original Report Authenticated By: Gerrianne Scale, M.D.   Dg Esophagus  08/26/2011  *RADIOLOGY REPORT*  Clinical Data: History of aspiration.  Probable Zenker's diverticulum noted during modified barium swallow.  ESOPHOGRAM/BARIUM SWALLOW  Technique:  Single contrast examination was  performed using thick barium.  Fluoroscopy time:  1.27 minutes.  Comparison:  No priors.  Findings:  Limited images were obtained with the patient swallowing thickened barium liquid.  Initial images of the thorax prior to swallow attempts demonstrated retained barium contrast within a focal outpouching of the proximal third of the esophagus, consistent with a Zenker's diverticulum.  The remainder of the esophagus was otherwise markedly patulous (distal esophagus was several centimeters in diameter). Multiple swallows were observed during fluoroscopy, and the Zenker's diverticulum repeatedly filled and partially emptied.  IMPRESSION: 1.  Limited single contrast barium esophagram confirms the presence of a large Zenker's diverticulum.  Original Report Authenticated By: Florencia Reasons, M.D.   Dg Pelvis Portable  08/24/2011  *RADIOLOGY REPORT*  Clinical Data: Postop right hip replacement.  PORTABLE PELVIS,RIGHT HIP - COMPLETE 2+ VIEW  Comparison: None.  Findings: Total right hip replacement appears in satisfactory position.  Surrounding lucencies appear to represent gas rather than fracture.  Prostatic seed implants in place.  Degenerative changes lower lumbar spine.  IMPRESSION: Satisfactory position total right hip replacement.  Original Report Authenticated By: Fuller Canada, M.D.   Dg Swallowing Func-no Report  08/26/2011  CLINICAL DATA: dysphagia   FLUOROSCOPY FOR SWALLOWING FUNCTION STUDY:  Fluoroscopy was provided for swallowing function study, which was  administered by a speech pathologist.  Final results and recommendations  from this study are contained within the speech pathology report.        MDM  Iv ns. Cxr. Labs.    Date: 08/29/2011  Rate: 74  Rhythm: normal sinus rhythm  QRS Axis: normal  Intervals: normal  ST/T Wave abnormalities: normal  Conduction Disutrbances:none  Narrative Interpretation:   Old EKG Reviewed: none available    Pt with hap, will rx zosyn and vanc.    Discussed w triad, indicates put in bed request for team 4.   Recheck pt, no change in condition. Discussed plan and results w pt.   kcl po.     Suzi Roots, MD 08/29/11 [phone]

## 2011-08-29 NOTE — Progress Notes (Signed)
ANTIBIOTIC CONSULT NOTE - INITIAL  Pharmacy Consult for Vancomycin Indication: pneumonia  No Known Allergies  Patient Measurements:   Body Weight: 70.5 kg  Vital Signs: Temp: 98.3 F (36.8 C) (03/09 1849) Temp src: Oral (03/09 1849) BP: 152/75 mmHg (03/09 1849) Pulse Rate: 75  (03/09 1849)  Labs:  Basename 08/29/11 1300 08/27/11 0615  WBC 6.9 11.6*  HGB 8.5* 9.3*  PLT 241 205  LABCREA -- --  CREATININE 0.86 1.01   The CrCl is unknown because both a height and weight (above a minimum accepted value) are required for this calculation. No results found for this basename: VANCOTROUGH:2,VANCOPEAK:2,VANCORANDOM:2,GENTTROUGH:2,GENTPEAK:2,GENTRANDOM:2,TOBRATROUGH:2,TOBRAPEAK:2,TOBRARND:2,AMIKACINPEAK:2,AMIKACINTROU:2,AMIKACIN:2, in the last 72 hours   Microbiology: Recent Results (from the past 720 hour(s))  SURGICAL PCR SCREEN     Status: Abnormal   Collection Time   08/20/11 10:47 AM      Component Value Range Status Comment   MRSA, PCR NEGATIVE  NEGATIVE  Final    Staphylococcus aureus POSITIVE (*) NEGATIVE  Final   URINE CULTURE     Status: Normal   Collection Time   08/20/11 11:13 AM      Component Value Range Status Comment   Specimen Description URINE, CLEAN CATCH   Final    Special Requests none   Final    Culture  Setup Time [id number]   Final    Colony Count NO GROWTH   Final    Culture NO GROWTH   Final    Report Status 08/21/2011 FINAL   Final     Medical History: Past Medical History  Diagnosis Date  . Diabetes mellitus   . Hyperlipidemia     takes Lipitor daily  . Adenomatous colon polyp 3/09  . Personal history of prostate cancer   . Dysphagia     with pills  . Hypertension     takes Fosinopril daily  . Sleep apnea     sleep study done in 03/30/04 in epic;doesn't use a CPAP  . Pneumonia     hx of as a child  . Arthritis     back and hip  . Dizziness     pt states when he is hot and stands up too fast  . Back pain     hx of ruptured disc  .  Dry skin   . Hemorrhoid     internal   . Hx of colonic polyps   . Nocturia   . Cancer     prostate   . Impaired hearing   . 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 Pt is also at risk of aspiration PNA 2/2 dysphagia associated with Zenker's diverticulum.  CrCl(CG)~63 mL/min.  Patient has already received vancomycin 1g once today in ER.  Agree with current doses of cefepime (1g q8) and Levaquin (750 q24).  Goal of Therapy:  Vancomycin trough level 15-20 mcg/ml  Plan:  Vancomycin 1g IV q12h. Continue cefepime and Levaquin at current doses.  Clance Boll 08/29/2011,8:37 PM

## 2011-08-30 LAB — DIFFERENTIAL
Basophils Absolute: 0 10*3/uL (ref 0.0–0.1)
Basophils Relative: 0 % (ref 0–1)
Eosinophils Absolute: 0 10*3/uL (ref 0.0–0.7)
Eosinophils Relative: 0 % (ref 0–5)
Lymphocytes Relative: 9 % — ABNORMAL LOW (ref 12–46)
Lymphs Abs: 0.8 10*3/uL (ref 0.7–4.0)
Monocytes Absolute: 0.6 10*3/uL (ref 0.1–1.0)
Monocytes Relative: 7 % (ref 3–12)
Neutro Abs: 6.8 10*3/uL (ref 1.7–7.7)
Neutrophils Relative %: 83 % — ABNORMAL HIGH (ref 43–77)

## 2011-08-30 LAB — EXPECTORATED SPUTUM ASSESSMENT W GRAM STAIN, RFLX TO RESP C

## 2011-08-30 LAB — CBC
HCT: 26.1 % — ABNORMAL LOW (ref 39.0–52.0)
Hemoglobin: 8.7 g/dL — ABNORMAL LOW (ref 13.0–17.0)
MCH: 30.6 pg (ref 26.0–34.0)
MCHC: 33.3 g/dL (ref 30.0–36.0)
MCV: 91.9 fL (ref 78.0–100.0)
Platelets: 274 10*3/uL (ref 150–400)
RBC: 2.84 MIL/uL — ABNORMAL LOW (ref 4.22–5.81)
RDW: 13.3 % (ref 11.5–15.5)
WBC: 8.2 10*3/uL (ref 4.0–10.5)

## 2011-08-30 LAB — VANCOMYCIN, TROUGH: Vancomycin Tr: 14.9 ug/mL (ref 10.0–20.0)

## 2011-08-30 LAB — BASIC METABOLIC PANEL
CO2: 26 mEq/L (ref 19–32)
Calcium: 8.6 mg/dL (ref 8.4–10.5)
Creatinine, Ser: 0.98 mg/dL (ref 0.50–1.35)
Glucose, Bld: 137 mg/dL — ABNORMAL HIGH (ref 70–99)

## 2011-08-30 NOTE — Plan of Care (Signed)
Problem: Phase I Progression Outcomes Goal: Pain controlled with appropriate interventions Outcome: Completed/Met Date Met:  08/30/11 No pain per patient

## 2011-08-30 NOTE — Progress Notes (Signed)
Speech Language/Pathology SLP Cancellation Note ST received order for BSE.  Review of medical chart reveals patient had MBS completed on 08-26-11 with recommendations of temporary alternative means of nutrition vs. Liquid diet thickened to nectar thick consistency and proceed with ENT consult.  BSE   deferred to 08-31-11. RN contacted via phone and verbalized understanding.  Moreen Fowler, M.S., CCC-SLP [phone]  [facility] 08/30/2011, 4:24 PM

## 2011-08-30 NOTE — Progress Notes (Signed)
Subjective: Pt stats that his greatest concern at present is that he is unable to eat. Objective: Filed Vitals:   08/29/11 2148 08/29/11 2300 08/30/11 0629 08/30/11 1423  BP: 163/79  183/83 168/74  Pulse: 71  78 73  Temp: 98.1 F (36.7 C)  98.3 F (36.8 C) 97.9 F (36.6 C)  TempSrc: Oral  Oral Oral  Resp: 18  20 18   Height:  5\' 8"  (1.727 m)    Weight:  70.6 kg (155 lb 10.3 oz)    SpO2: 95%  92% 95%   Weight change:  No intake or output data in the 24 hours ending 08/30/11 1804  General: Alert, awake, oriented x3, in no acute distress.  HEENT: [place]/AT PEERL, EOMI Neck: Trachea midline,  no masses, no thyromegal,y no JVD, no carotid bruit OROPHARYNX:  Moist, No exudate/ erythema/lesions.  Heart: Regular rate and rhythm, without murmurs, rubs, gallops, PMI non-displaced, no heaves or thrills on palpation.  Lungs: Clear to auscultation, no wheezing or rhonchi noted. No increased vocal fremitus resonant to percussion  Abdomen: Soft, nontender, nondistended, positive bowel sounds, no masses no hepatosplenomegaly noted..  Neuro: No focal neurological deficits noted cranial nerves II through XII grossly intact.    Lab Results:  Basename 08/30/11 0500 08/29/11 1300  NA 143 142  K 3.3* 3.1*  CL 109 107  CO2 26 28  GLUCOSE 137* 126*  BUN 23 21  CREATININE 0.98 0.86  CALCIUM 8.6 8.2*  MG -- --  PHOS -- --    Basename 08/29/11 1300  AST 56*  ALT 53  ALKPHOS 52  BILITOT 0.7  PROT 5.7*  ALBUMIN 2.5*   No results found for this basename: LIPASE:2,AMYLASE:2 in the last 72 hours  Basename 08/30/11 0500 08/29/11 1300  WBC 8.2 6.9  NEUTROABS 6.8 5.6  HGB 8.7* 8.5*  HCT 26.1* 24.9*  MCV 91.9 90.2  PLT 274 241   No results found for this basename: CKTOTAL:3,CKMB:3,CKMBINDEX:3,TROPONINI:3 in the last 72 hours No components found with this basename: POCBNP:3 No results found for this basename: DDIMER:2 in the last 72 hours No results found for this basename: HGBA1C:2 in the  last 72 hours No results found for this basename: CHOL:2,HDL:2,LDLCALC:2,TRIG:2,CHOLHDL:2,LDLDIRECT:2 in the last 72 hours No results found for this basename: TSH,T4TOTAL,FREET3,T3FREE,THYROIDAB in the last 72 hours No results found for this basename: VITAMINB12:2,FOLATE:2,FERRITIN:2,TIBC:2,IRON:2,RETICCTPCT:2 in the last 72 hours  Micro Results: Recent Results (from the past 240 hour(s))  CULTURE, SPUTUM-ASSESSMENT     Status: Normal   Collection Time   08/30/11 12:56 PM      Component Value Range Status Comment   Specimen Description SPUTUM   Final    Special Requests NONE   Final    Sputum evaluation     Final    Value: THIS SPECIMEN IS ACCEPTABLE. RESPIRATORY CULTURE REPORT TO FOLLOW.   Report Status 08/30/2011 FINAL   Final     Studies/Results: Dg Chest 2 View  08/29/2011  *RADIOLOGY REPORT*  Clinical Data: History of cough and fever.  CHEST - 2 VIEW  Comparison: Multiple priors, most recently 08/26/2011.  Findings: Compared to the recent radiograph from 08/26/2011, there is now an area of ill-defined interstitial prominence and patchy air space disease in the left midlung, and potentially the left lower lobe (retrocardiac region), as well as the medial aspect of the right lower lobe.  Findings are concerning for multilobar pneumonia.  Small bilateral pleural effusions are noted on the lateral projection.  Pulmonary vasculature is normal.  Heart  size is within normal limits.  Mediastinal contours are unremarkable. Atherosclerosis of the thoracic aorta.  Marked gaseous distension of the stomach is again noted.  IMPRESSION: 1.  Interval development of patchy interstitial and airspace disease in the left mid lung and lower lobes of the lungs bilaterally, with small bilateral pleural effusions.  Findings are concerning for developing multilobar pneumonia. 2.  Atherosclerosis. 3.  Persistent gaseous distension of the stomach.  Original Report Authenticated By: Florencia Reasons, M.D.   Dg Chest  2 View  08/26/2011  *RADIOLOGY REPORT*  Clinical Data: Evaluate atelectasis or pneumonia.  CHEST - 2 VIEW  Comparison: 08/20/2011  Findings: Gaseous distention of the stomach and upper abdominal bowel loops underneath the right hemidiaphragm.  Low lung volumes with bibasilar atelectasis.  No effusions.  Heart is normal size. No acute bony abnormality.  IMPRESSION: Low lung volumes with bibasilar atelectasis.  Gaseous distention of the stomach and upper abdominal bowel loops under the right hemidiaphragm.  Original Report Authenticated By: Cyndie Chime, M.D.   Dg Chest 2 View  08/20/2011  *RADIOLOGY REPORT*  Clinical Data: Preop  CHEST - 2 VIEW  Comparison: 07/25/2008  Findings: Cardiomediastinal silhouette is stable.  Mild hyperinflation.  No acute infiltrate or pleural effusion.  No pulmonary edema.  Mild degenerative changes thoracic spine.  IMPRESSION: No active disease.  Original Report Authenticated By: Natasha Mead, M.D.   Dg Hip Complete Right  08/24/2011  *RADIOLOGY REPORT*  Clinical Data: Postop right hip replacement.  PORTABLE PELVIS,RIGHT HIP - COMPLETE 2+ VIEW  Comparison: None.  Findings: Total right hip replacement appears in satisfactory position.  Surrounding lucencies appear to represent gas rather than fracture.  Prostatic seed implants in place.  Degenerative changes lower lumbar spine.  IMPRESSION: Satisfactory position total right hip replacement.  Original Report Authenticated By: Fuller Canada, M.D.   Dg Abd 1 View  08/26/2011  *RADIOLOGY REPORT*  Clinical Data: Right abdominal pain.  ABDOMEN - 1 VIEW  Comparison: One-view pelvis 08/24/2011.  Abdominal CT 04/03/2004.  Findings: The upper abdomen is excluded.  There is mild diffuse distension of the stomach, small and large bowel.  No bowel wall thickening is identified.  There is no supine evidence of free intraperitoneal air.  Prostatic brachytherapy seeds appear unchanged.  There are stable mild degenerative changes of the lower  lumbar spine and left hip.  Patient is status post right hip arthroplasty.  IMPRESSION: Diffuse bowel distension most consistent with ileus.  Correlate clinically.  Original Report Authenticated By: Gerrianne Scale, M.D.   Dg Esophagus  08/26/2011  *RADIOLOGY REPORT*  Clinical Data: History of aspiration.  Probable Zenker's diverticulum noted during modified barium swallow.  ESOPHOGRAM/BARIUM SWALLOW  Technique:  Single contrast examination was performed using thick barium.  Fluoroscopy time:  1.27 minutes.  Comparison:  No priors.  Findings:  Limited images were obtained with the patient swallowing thickened barium liquid.  Initial images of the thorax prior to swallow attempts demonstrated retained barium contrast within a focal outpouching of the proximal third of the esophagus, consistent with a Zenker's diverticulum.  The remainder of the esophagus was otherwise markedly patulous (distal esophagus was several centimeters in diameter). Multiple swallows were observed during fluoroscopy, and the Zenker's diverticulum repeatedly filled and partially emptied.  IMPRESSION: 1.  Limited single contrast barium esophagram confirms the presence of a large Zenker's diverticulum.  Original Report Authenticated By: Florencia Reasons, M.D.   Dg Pelvis Portable  08/24/2011  *RADIOLOGY REPORT*  Clinical Data: Postop  right hip replacement.  PORTABLE PELVIS,RIGHT HIP - COMPLETE 2+ VIEW  Comparison: None.  Findings: Total right hip replacement appears in satisfactory position.  Surrounding lucencies appear to represent gas rather than fracture.  Prostatic seed implants in place.  Degenerative changes lower lumbar spine.  IMPRESSION: Satisfactory position total right hip replacement.  Original Report Authenticated By: Fuller Canada, M.D.   Dg Swallowing Func-no Report  08/26/2011  CLINICAL DATA: dysphagia   FLUOROSCOPY FOR SWALLOWING FUNCTION STUDY:  Fluoroscopy was provided for swallowing function study, which was   administered by a speech pathologist.  Final results and recommendations  from this study are contained within the speech pathology report.      Medications: I have reviewed the patient's current medications. Scheduled Meds:   . aspirin EC  81 mg Oral Daily  . atorvastatin  40 mg Oral Daily  . ceFEPime (MAXIPIME) IV  1 g Intravenous Q8H  . cholecalciferol  1,000 Units Oral Daily  . docusate sodium  100 mg Oral BID  . enoxaparin  40 mg Subcutaneous Q24H  . levofloxacin (LEVAQUIN) IV  750 mg Intravenous Q24H  . lisinopril  40 mg Oral Daily  . loratadine  10 mg Oral Daily  . piperacillin-tazobactam (ZOSYN)  IV  3.375 g Intravenous Once  . topiramate  25 mg Oral Daily  . vancomycin  1,000 mg Intravenous Q12H  . vitamin C  500 mg Oral Daily   Continuous Infusions:   . DISCONTD: sodium chloride 20 mL/hr at 08/29/11 1311   PRN Meds:.food thickener Assessment/Plan: Patient Active Hospital Problem List: Healthcare-associated pneumonia (08/29/2011)   Assessment: Continue IV antibiotics. Pt on da #2 of Levaquin, cefepime and Vancomycin.  Fever (08/29/2011)   Assessment: Secondary to pneumonia   DIABETES MELLITUS, TYPE II (11/02/2006)   Assessment: Pt and family report that pt does not have diabetes.    Dysphagia   Assessment: PT has Zenker's diverticulum and has a dysphagia. Will speak with Dr. Annalee Genta in the morning. Will change diet to nectar thickened full liquids per SLP recommendations.   LOS: 1 day

## 2011-08-31 LAB — LEGIONELLA ANTIGEN, URINE

## 2011-08-31 LAB — VANCOMYCIN, TROUGH: Vancomycin Tr: 15.4 ug/mL (ref 10.0–20.0)

## 2011-08-31 MED ORDER — HYDRALAZINE HCL 20 MG/ML IJ SOLN
10.0000 mg | Freq: Three times a day (TID) | INTRAMUSCULAR | Status: DC | PRN
  Filled 2011-08-31: qty 0.5

## 2011-08-31 NOTE — Progress Notes (Signed)
ANTIBIOTIC CONSULT NOTE - FOLLOW UP  Pharmacy Consult for vancomycin Indication: pneumonia  No Known Allergies  Patient Measurements: Height: 5\' 8"  (172.7 cm) Weight: 155 lb 10.3 oz (70.6 kg) IBW/kg (Calculated) : 68.4  Adjusted Body Weight:   Vital Signs: Temp: 98 F (36.7 C) (03/10 2203) Temp src: Oral (03/10 2203) BP: 170/82 mmHg (03/10 2203) Pulse Rate: 70  (03/10 2203) Intake/Output from previous day: 03/10 0701 - 03/11 0700 In: -  Out: 500 [Urine:500] Intake/Output from this shift: Total I/O In: -  Out: 500 [Urine:500]  Labs:  Basename 08/30/11 0500 08/29/11 1300  WBC 8.2 6.9  HGB 8.7* 8.5*  PLT 274 241  LABCREA -- --  CREATININE 0.98 0.86   Estimated Creatinine Clearance: 53.3 ml/min (by C-G formula based on Cr of 0.98).  Basename 08/31/11 0325 08/30/11 1553  VANCOTROUGH 15.4 14.9  VANCOPEAK -- --  Drue Dun -- --  GENTTROUGH -- --  GENTPEAK -- --  GENTRANDOM -- --  TOBRATROUGH -- --  TOBRAPEAK -- --  TOBRARND -- --  AMIKACINPEAK -- --  AMIKACINTROU -- --  AMIKACIN -- --     Microbiology: Recent Results (from the past 720 hour(s))  SURGICAL PCR SCREEN     Status: Abnormal   Collection Time   08/20/11 10:47 AM      Component Value Range Status Comment   MRSA, PCR NEGATIVE  NEGATIVE  Final    Staphylococcus aureus POSITIVE (*) NEGATIVE  Final   URINE CULTURE     Status: Normal   Collection Time   08/20/11 11:13 AM      Component Value Range Status Comment   Specimen Description URINE, CLEAN CATCH   Final    Special Requests none   Final    Culture  Setup Time [id number]   Final    Colony Count NO GROWTH   Final    Culture NO GROWTH   Final    Report Status 08/21/2011 FINAL   Final   CULTURE, SPUTUM-ASSESSMENT     Status: Normal   Collection Time   08/30/11 12:56 PM      Component Value Range Status Comment   Specimen Description SPUTUM   Final    Special Requests NONE   Final    Sputum evaluation     Final    Value: THIS SPECIMEN IS  ACCEPTABLE. RESPIRATORY CULTURE REPORT TO FOLLOW.   Report Status 08/30/2011 FINAL   Final     Anti-infectives     Start     Dose/Rate Route Frequency Ordered Stop   08/30/11 0400   vancomycin (VANCOCIN) IVPB 1000 mg/200 mL premix        1,000 mg 200 mL/hr over 60 Minutes Intravenous Every 12 hours 08/29/11 2046 09/07/11 0359   08/29/11 2200   ceFEPIme (MAXIPIME) 1 g in dextrose 5 % 50 mL IVPB        1 g 100 mL/hr over 30 Minutes Intravenous 3 times per day 08/29/11 2021 09/06/11 2159   08/29/11 2200   Levofloxacin (LEVAQUIN) IVPB 750 mg        750 mg 100 mL/hr over 90 Minutes Intravenous Every 24 hours 08/29/11 2021 09/06/11 2159   08/29/11 1600  piperacillin-tazobactam (ZOSYN) IVPB 3.375 g       3.375 g 12.5 mL/hr over 240 Minutes Intravenous  Once 08/29/11 1523 08/29/11 1942   08/29/11 1600   vancomycin (VANCOCIN) IVPB 1000 mg/200 mL premix        1,000 mg 200 mL/hr over  60 Minutes Intravenous  Once 08/29/11 1523 08/29/11 1727          Assessment: Patient with vancomycin at goal.  Goal of Therapy:  Vancomycin trough level 15-20 mcg/ml  Plan:  Measure antibiotic drug levels at steady state Follow up culture results Continue vancomycin at current dosing  Aleene Davidson Crowford 08/31/2011,4:45 AM

## 2011-08-31 NOTE — Progress Notes (Addendum)
[facility] Per admissions patient is fine to return upon discharge. CSW met with patient. Patient is alert and oriented. Patient agreeable to [facility].  Darius Castro C. Aunika Kirsten MSW, LCSW [phone]

## 2011-08-31 NOTE — Consult Note (Signed)
Reason for Consult:Zenker's diverticulum Referring Physician: Hospitalist  Darius Castro is an 78 y.o. male.  HPI: 78 year old male who underwent hip replacement surgery one week ago.  Prior to surgery, he had noted some difficulty swallowing big pills about a year ago and converted to smaller pills with good resolution of difficulty.  He had no other problems with his swallow until his hip replacement.  Some time after that surgery, he had greater difficulty swallowing.  A modified barium swallow and barium swallow demonstrated a Zenker's diverticulum.  Outpatient evaluation was planned but he had to be admitted this past weekend due to pneumonia.  He is currently being treated with IV antibiotics for pneumonia.  He has been made NPO pending further evaluation.  He feels that his pneumonia is improving.  Past Medical History  Diagnosis Date  . Diabetes mellitus   . Hyperlipidemia     takes Lipitor daily  . Adenomatous colon polyp 3/09  . Personal history of prostate cancer   . Dysphagia     with pills  . Hypertension     takes Fosinopril daily  . Sleep apnea     sleep study done in 03/30/04 in epic;doesn't use a CPAP  . Pneumonia     hx of as a child  . Arthritis     back and hip  . Dizziness     pt states when he is hot and stands up too fast  . Back pain     hx of ruptured disc  . Dry skin   . Hemorrhoid     internal   . Hx of colonic polyps   . Nocturia   . Cancer     prostate   . Impaired hearing   . 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 Lumbar laminectomy 1998  . Knee arthroscopy 2001  . Rotator cuff repair 2/10    right  . Cataract extraction     bilateral  . Tonsillectomy     as a child  . Colonoscopy   . Radioactive seed implant 2008  . Total hip arthroplasty 08/24/2011    Procedure: TOTAL HIP ARTHROPLASTY;  Surgeon: Nilda Simmer, MD;  Location: [facility] OR;  Service: Orthopedics;  Laterality: Right;  DR Thurston Hole  WANTS 90 MINUTES FOR SURGERY    Family History  Problem Relation Age of Onset  . Heart disease Father   . Heart attack Father   . Colon polyps Daughter   . Colon cancer Neg Hx   . Anesthesia problems Neg Hx   . Hypotension Neg Hx   . Malignant hyperthermia Neg Hx   . Pseudochol deficiency Neg Hx   . Hypertension Mother     Social History:  reports that he quit smoking about 50 years ago. He does not have any smokeless tobacco history on file. He reports that he drinks alcohol. He reports that he does not use illicit drugs.  Allergies: No Known Allergies  Medications: I have reviewed the patient's current medications.  Results for orders placed during the hospital encounter of 08/29/11 (from the past 48 hour(s))  LEGIONELLA ANTIGEN, URINE     Status: Normal   Collection Time   08/30/11  3:43 AM      Component Value Range Comment   Specimen Description URINE, RANDOM      Special Requests NONE      Legionella Antigen, Urine Negative for Legionella pneumophilia serogroup 1  Report Status 08/31/2011 FINAL     STREP PNEUMONIAE URINARY ANTIGEN     Status: Normal   Collection Time   08/30/11  3:43 AM      Component Value Range Comment   Strep Pneumo Urinary Antigen NEGATIVE  NEGATIVE    CBC     Status: Abnormal   Collection Time   08/30/11  5:00 AM      Component Value Range Comment   WBC 8.2  4.0 - 10.5 (K/uL)    RBC 2.84 (*) 4.22 - 5.81 (MIL/uL)    Hemoglobin 8.7 (*) 13.0 - 17.0 (g/dL)    HCT 16.1 (*) 09.6 - 52.0 (%)    MCV 91.9  78.0 - 100.0 (fL)    MCH 30.6  26.0 - 34.0 (pg)    MCHC 33.3  30.0 - 36.0 (g/dL)    RDW 04.5  40.9 - 81.1 (%)    Platelets 274  150 - 400 (K/uL)   DIFFERENTIAL     Status: Abnormal   Collection Time   08/30/11  5:00 AM      Component Value Range Comment   Neutrophils Relative 83 (*) 43 - 77 (%)    Neutro Abs 6.8  1.7 - 7.7 (K/uL)    Lymphocytes Relative 9 (*) 12 - 46 (%)    Lymphs Abs 0.8  0.7 - 4.0 (K/uL)    Monocytes Relative 7  3 - 12  (%)    Monocytes Absolute 0.6  0.1 - 1.0 (K/uL)    Eosinophils Relative 0  0 - 5 (%)    Eosinophils Absolute 0.0  0.0 - 0.7 (K/uL)    Basophils Relative 0  0 - 1 (%)    Basophils Absolute 0.0  0.0 - 0.1 (K/uL)   BASIC METABOLIC PANEL     Status: Abnormal   Collection Time   08/30/11  5:00 AM      Component Value Range Comment   Sodium 143  135 - 145 (mEq/L)    Potassium 3.3 (*) 3.5 - 5.1 (mEq/L)    Chloride 109  96 - 112 (mEq/L)    CO2 26  19 - 32 (mEq/L)    Glucose, Bld 137 (*) 70 - 99 (mg/dL)    BUN 23  6 - 23 (mg/dL)    Creatinine, Ser 9.14  0.50 - 1.35 (mg/dL)    Calcium 8.6  8.4 - 10.5 (mg/dL)    GFR calc non Af Amer 73 (*) >90 (mL/min)    GFR calc Af Amer 84 (*) >90 (mL/min)   CULTURE, SPUTUM-ASSESSMENT     Status: Normal   Collection Time   08/30/11 12:56 PM      Component Value Range Comment   Specimen Description SPUTUM      Special Requests NONE      Sputum evaluation        Value: THIS SPECIMEN IS ACCEPTABLE. RESPIRATORY CULTURE REPORT TO FOLLOW.   Report Status 08/30/2011 FINAL     CULTURE, RESPIRATORY     Status: Normal (Preliminary result)   Collection Time   08/30/11  1:00 PM      Component Value Range Comment   Specimen Description SPUTUM      Special Requests NONE      Gram Stain PENDING      Culture Culture reincubated for better growth      Report Status PENDING     Margaree Mackintosh     Status: Normal   Collection Time   08/30/11  3:53 PM      Component Value Range Comment   Vancomycin Tr 14.9  10.0 - 20.0 (ug/mL)   VANCOMYCIN, TROUGH     Status: Normal   Collection Time   08/31/11  3:25 AM      Component Value Range Comment   Vancomycin Tr 15.4  10.0 - 20.0 (ug/mL)     No results found.  Review of Systems  Respiratory: Positive for cough and sputum production.   All other systems reviewed and are negative.   Blood pressure 173/74, pulse 72, temperature 98.5 F (36.9 C), temperature source Oral, resp. rate 20, height 5\' 8"  (1.727 m), weight  70.6 kg (155 lb 10.3 oz), SpO2 91.00%. Physical Exam  Constitutional: He is oriented to person, place, and time. He appears well-developed and well-nourished. No distress.  HENT:  Head: Normocephalic and atraumatic.  Right Ear: External ear normal.  Left Ear: External ear normal.  Nose: Nose normal.  Mouth/Throat: Oropharynx is clear and moist.  Eyes: Conjunctivae and EOM are normal. Pupils are equal, round, and reactive to light.  Neck: Normal range of motion. Neck supple.  Cardiovascular: Normal rate.   Respiratory: Effort normal.  GI:       Did not examine.  Genitourinary:       Did not examine.  Musculoskeletal: Normal range of motion.  Neurological: He is alert and oriented to person, place, and time. No cranial nerve deficit.  Skin: Skin is warm and dry.  Psychiatric: He has a normal mood and affect. His behavior is normal. Judgment and thought content normal.    Assessment/Plan: Zenker's diverticulum, dysphagia, pneumonia. I personally reviewed the barium swallow.  The fact that he has had no symptoms related to the Zenker's diverticulum until the past week is interesting.  Perhaps instrumentation of the upper aerodigestive track around surgery exascerbated the problem.  The pneumonia is presumably related to the Zenker's if it is causing aspiration.  I discussed surgical therapy for the Zenker's diverticulum via endoscopic management.  This surgery would require transfer to [facility].  In light of his recent pneumonia diagnosis, pressing forward with general anesthesia in the immediate future would add undue risk.  Thus, I recommended placement of a Panda tube for nutrition while continuing antibiotic therapy for the pneumonia through the week.  Surgical treatment will be planned for early next week.  Darius Castro 08/31/2011, 9:00 PM

## 2011-08-31 NOTE — Evaluation (Signed)
Physical Therapy Evaluation Patient Details Name: Darius Castro MRN: [id number] DOB: October 15, 1923 Today's Date: 08/31/2011  Problem List:  Patient Active Problem List  Diagnoses  . DIABETES MELLITUS, TYPE II  . HYPERLIPIDEMIA  . HYPERTENSION  . PROSTATE CANCER, HX OF  . COLONIC POLYPS, HX OF  . Hip pain, right  . Osteoarthritis  . Syncope  . Zenker's hypopharyngeal diverticulum  . Healthcare-associated pneumonia  . Fever    Past Medical History:  Past Medical History  Diagnosis Date  . Diabetes mellitus   . Hyperlipidemia     takes Lipitor daily  . Adenomatous colon polyp 3/09  . Personal history of prostate cancer   . Dysphagia     with pills  . Hypertension     takes Fosinopril daily  . Sleep apnea     sleep study done in 03/30/04 in epic;doesn't use a CPAP  . Pneumonia     hx of as a child  . Arthritis     back and hip  . Dizziness     pt states when he is hot and stands up too fast  . Back pain     hx of ruptured disc  . Dry skin   . Hemorrhoid     internal   . Hx of colonic polyps   . Nocturia   . Cancer     prostate   . Impaired hearing   . 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 Lumbar laminectomy 1998  . Knee arthroscopy 2001  . Rotator cuff repair 2/10    right  . Cataract extraction     bilateral  . Tonsillectomy     as a child  . Colonoscopy   . Radioactive seed implant 2008  . Total hip arthroplasty 08/24/2011    Procedure: TOTAL HIP ARTHROPLASTY;  Surgeon: Nilda Simmer, MD;  Location: [facility] OR;  Service: Orthopedics;  Laterality: Right;  DR Anne Ng 90 MINUTES FOR SURGERY    PT Assessment/Plan/Recommendation PT Assessment Clinical Impression Statement: 78 y.o. male admitted with HCAP and difficulty swalllowing. Pt is deconditioned, fatigues very quickly and becomes SOB with minimal activity. ST-SNF recommended. Pt would benefit from acute PT to maximize safety and  Independence with mobility.  PT Recommendation/Assessment: Patient will need skilled PT in the acute care venue PT Problem List: Decreased activity tolerance;Decreased strength;Decreased mobility Barriers to Discharge: None PT Plan PT Frequency: 7X/week PT Treatment/Interventions: DME instruction;Gait training;Therapeutic activities;Functional mobility training;Therapeutic exercise;Patient/family education PT Recommendation Follow Up Recommendations: Skilled nursing facility Equipment Recommended: Defer to next venue PT Goals  Acute Rehab PT Goals PT Goal Formulation: With patient Time For Goal Achievement: 2 weeks Pt will go Supine/Side to Sit: with modified independence PT Goal: Supine/Side to Sit - Progress: Goal set today Pt will go Sit to Supine/Side: with modified independence PT Goal: Sit to Supine/Side - Progress: Goal set today Pt will go Sit to Stand: with modified independence PT Goal: Sit to Stand - Progress: Goal set today Pt will Ambulate: >150 feet;with modified independence;with rolling walker PT Goal: Ambulate - Progress: Goal set today Pt will Perform Home Exercise Program: Independently PT Goal: Perform Home Exercise Program - Progress: Goal set today Additional Goals Additional Goal #1: pt will verbalize 3/3 hip precautions.   PT Goal: Additional Goal #1 - Progress: Goal set today  PT Evaluation Precautions/Restrictions  Precautions Precautions: Posterior Hip Restrictions Weight Bearing Restrictions: Yes RLE Weight Bearing: Partial weight  bearing RLE Partial Weight Bearing Percentage or Pounds: 50 Prior Functioning  Home Living Lives With: Spouse Receives Help From:  (staff at [facility] as needed) Type of Home: Independent living facility Home Layout: One level Home Access: Level entry Bathroom Shower/Tub: Heritage manager Toilet: Handicapped height Home Adaptive Equipment: Walker - rolling;Bedside commode/3-in-1;Built-in shower seat;Grab  bars around toilet;Grab bars in shower Prior Function Level of Independence: Independent with transfers;Independent with basic ADLs;Requires assistive device for independence (used RW prior to [facility]) Driving: Yes Vocation: Retired Financial risk analyst Arousal/Alertness: Awake/alert Orientation Level: Oriented X4 Sensation/Coordination Sensation Light Touch: Appears Intact Coordination Gross Motor Movements are Fluid and Coordinated: Yes Fine Motor Movements are Fluid and Coordinated: Yes Extremity Assessment RUE Assessment RUE Assessment: Within Functional Limits LUE Assessment LUE Assessment: Within Functional Limits RLE Assessment RLE Assessment: Exceptions to [facility] RLE Strength RLE Overall Strength Comments: Strength limited by pain (knee ext +3/5) LLE Assessment LLE Assessment: Within Functional Limits Mobility (including Balance) Bed Mobility Bed Mobility: Yes Supine to Sit: 3: Mod assist;With rails Supine to Sit Details (indicate cue type and reason):   Sitting - Scoot to Edge of Bed: 4: Min assist Transfers Transfers: Yes Sit to Stand: 4: Min assist;From bed;From elevated surface;With upper extremity assist Stand to Sit: 4: Min assist;With upper extremity assist;With armrests;To chair/3-in-1 Stand to Sit Details: VCs for hand placement, pt 85%, assist to control descent Ambulation/Gait Ambulation/Gait: Yes Ambulation/Gait Assistance: 4: Min assist Ambulation/Gait Assistance Details (indicate cue type and reason): distance limited by fatigue, pt hasn't had much oral intake in several days and feels weak Ambulation Distance (Feet): 4 Feet (bed to recliner) Assistive device: Rolling walker Gait Pattern: Step-to pattern;Decreased stride length;Trunk flexed Stairs: No  Balance Balance Assessed: No Exercise  Total Joint Exercises Ankle Circles/Pumps: AROM;Both;10 reps Quad Sets: AROM;Both;10 reps End of Session PT - End of Session Equipment Utilized During Treatment:  Gait belt Activity Tolerance: Patient limited by fatigue Patient left: in chair;with call bell in reach Nurse Communication: Mobility status for transfers;Mobility status for ambulation (low O2 sats) General Behavior During Session: [facility] for tasks performed Cognition: [facility] for tasks performed  Tamala Ser 08/31/2011, 10:39 AM [phone]

## 2011-08-31 NOTE — Progress Notes (Signed)
OT Note:  Pt was fatiqued after PT.  Will check tomorrow.  [place], OTR/L [phone] 08/31/2011

## 2011-08-31 NOTE — Progress Notes (Signed)
Speech Language/Pathology Order received for Bedside Swallow Evaluation. Chart reviewed and discussed results of last week's MBS with Harlon Ditty, SLP who completed the MBS, who's recommendation was NPO with Panda TF vs. Allowing Nectar thick liquids.  Patient was d/c'd home on Nectar thick liquids and follow-up ENT appointment (scheduled this am) with Dr. Annalee Genta.  Patient was readmitted over the weekend with pneumonia.  Discussed the above with Dr. Ashley Royalty, who will f/u with Dr. Annalee Genta re: potential treatment for the Zinker's diverticulum.  SLP will f/u.  Maryjo Rochester T

## 2011-08-31 NOTE — Progress Notes (Signed)
INITIAL ADULT NUTRITION ASSESSMENT Date: 08/31/2011   Time: 1:41 PM Reason for Assessment: Nutrition risk   ASSESSMENT: [name]  Dx: Healthcare-associated PNA  Hx:  Past Medical History  Diagnosis Date  . Diabetes mellitus   . Hyperlipidemia     takes Lipitor daily  . Adenomatous colon polyp 3/09  . Personal history of prostate cancer   . Dysphagia     with pills  . Hypertension     takes Fosinopril daily  . Sleep apnea     sleep study done in 03/30/04 in epic;doesn't use a CPAP  . Pneumonia     hx of as a child  . Arthritis     back and hip  . Dizziness     pt states when he is hot and stands up too fast  . Back pain     hx of ruptured disc  . Dry skin   . Hemorrhoid     internal   . Hx of colonic polyps   . Nocturia   . Cancer     prostate   . Impaired hearing   . Zenker's hypopharyngeal diverticulum 08/27/2011   Related Meds:  Scheduled Meds:   . aspirin EC  81 mg Oral Daily  . atorvastatin  40 mg Oral Daily  . ceFEPime (MAXIPIME) IV  1 g Intravenous Q8H  . cholecalciferol  1,000 Units Oral Daily  . docusate sodium  100 mg Oral BID  . enoxaparin  40 mg Subcutaneous Q24H  . levofloxacin (LEVAQUIN) IV  750 mg Intravenous Q24H  . lisinopril  40 mg Oral Daily  . loratadine  10 mg Oral Daily  . topiramate  25 mg Oral Daily  . vancomycin  1,000 mg Intravenous Q12H  . vitamin C  500 mg Oral Daily   Continuous Infusions:  PRN Meds:.food thickener, hydrALAZINE  Ht: 5\' 8"  (172.7 cm)  Wt: 155 lb 10.3 oz (70.6 kg)  Ideal Wt: 154 lb % Ideal Wt: 100  Usual Wt: 160 lb % Usual Wt: 97  Body mass index is 23.67 kg/(m^2).  Food/Nutrition Related Hx: Pt reports difficulty swallowing for the past year with ongoing poor appetite. Pt states he tries to make himself eat 3 meals/day. Pt reports 7 pound unintentional weight loss in the past 3 months. Pt had MBS on 3/6 which showed severe cervical esophageal dysphagia secondary to moderate-sized Zenker's diverticulum  with recommended NPO with consideration for short term alternate nutrition with ENT referral and that if a diet was desired with known risk of aspiration, nectar thick liquids were recommended. In discharge summary on 08/27/11 MD noted that pt had reported having swallowing problems his whole life because he has a small esophagus but that it gets worse after surgery, which pt had right total hip replacement surgery with intubation on 08/24/11. Pt denies any nausea. Awaiting ENT consult.   DG of chest on 3/9 showed: 1. Interval development of patchy interstitial and airspace  disease in the left mid lung and lower lobes of the lungs  bilaterally, with small bilateral pleural effusions. Findings are  concerning for developing multilobar pneumonia.  2. Atherosclerosis.  3. Persistent gaseous distension of the stomach.   Labs:  CMP     Component Value Date/Time   NA 143 08/30/2011 0500   K 3.3* 08/30/2011 0500   CL 109 08/30/2011 0500   CO2 26 08/30/2011 0500   GLUCOSE 137* 08/30/2011 0500   BUN 23 08/30/2011 0500   CREATININE 0.98 08/30/2011 0500  CALCIUM 8.6 08/30/2011 0500   PROT 5.7* 08/29/2011 1300   ALBUMIN 2.5* 08/29/2011 1300   AST 56* 08/29/2011 1300   ALT 53 08/29/2011 1300   ALKPHOS 52 08/29/2011 1300   BILITOT 0.7 08/29/2011 1300   GFRNONAA 73* 08/30/2011 0500   GFRAA 84* 08/30/2011 0500   CBG (last 3)  No results found for this basename: GLUCAP:3 in the last 72 hours  Lab Results  Component Value Date   HGBA1C 6.3* 08/24/2011    Intake/Output Summary (Last 24 hours) at 08/31/11 1359 Last data filed at 08/31/11 1143  Gross per 24 hour  Intake      0 ml  Output    650 ml  Net   -650 ml   Last BM - 08/28/11  Diet Order: NPO   IVF:    Estimated Nutritional Needs:   Kcal:1850-2100 Protein:85-100g Fluid:1.8-2.1L  NUTRITION DIAGNOSIS: -Inadequate oral intake (NI-2.1).  Status: Ongoing  RELATED TO: severe dysphagia r/t Zenker's diverticulum, awaiting ENT consult for diet/nutrition  recommendations  AS EVIDENCE BY: NPO status   MONITORING/EVALUATION(Goals): Diet/artificial nutrition per ENT.   EDUCATION NEEDS: -No education needs identified at this time  INTERVENTION: Diet/nutrition per ENT. Awaiting to see if pt/family desire PO diet with aspiration risk or perhaps enteral nutrition. Will monitor.   Dietitian #: [phone]  DOCUMENTATION CODES Per approved criteria  -Not Applicable    Darius Castro 08/31/2011, 1:41 PM

## 2011-08-31 NOTE — Progress Notes (Signed)
Subjective: Patient still having difficulty with swallowing despite changing to a liquid diet. I made the patient n.p.o. I've explained to the patient his wife and son the importance of remaining n.p.o. For now I will hold off on getting the PEG tube until ENT to see and evaluate the patient. However this patient will likely need a PEG tube placed on by mouth a definitive course of treatment can be carried out regarding the Zenker's diverticulum. Objective: Filed Vitals:   08/31/11 0620 08/31/11 1027 08/31/11 1045 08/31/11 1451  BP: 177/75   173/74  Pulse: 66 76  72  Temp: 98.7 F (37.1 C)   98.5 F (36.9 C)  TempSrc: Oral   Oral  Resp: 20   20  Height:      Weight:      SpO2: 92% 88% 92% 91%   Weight change:   Intake/Output Summary (Last 24 hours) at 08/31/11 2017 Last data filed at 08/31/11 1837  Gross per 24 hour  Intake   1400 ml  Output   1100 ml  Net    300 ml    General: Alert, awake, oriented x3, in no acute distress. Patient sitting up in chair and is well appearing today HEENT: [place]/AT PEERL, EOMI Neck: Trachea midline,  no masses, no thyromegal,y no JVD, no carotid bruit OROPHARYNX:  Moist, No exudate/ erythema/lesions.  Heart: Regular rate and rhythm, without murmurs, rubs, gallops, PMI non-displaced, no heaves or thrills on palpation.  Lungs: Clear to auscultation, no wheezing or rhonchi noted. No increased vocal fremitus resonant to percussion  Abdomen: Soft, nontender, nondistended, positive bowel sounds, no masses no hepatosplenomegaly noted..  Neuro: No focal neurological deficits noted cranial nerves II through XII grossly intact.    Lab Results:  Basename 08/30/11 0500 08/29/11 1300  NA 143 142  K 3.3* 3.1*  CL 109 107  CO2 26 28  GLUCOSE 137* 126*  BUN 23 21  CREATININE 0.98 0.86  CALCIUM 8.6 8.2*  MG -- --  PHOS -- --    Basename 08/29/11 1300  AST 56*  ALT 53  ALKPHOS 52  BILITOT 0.7  PROT 5.7*  ALBUMIN 2.5*   No results found for this  basename: LIPASE:2,AMYLASE:2 in the last 72 hours  Basename 08/30/11 0500 08/29/11 1300  WBC 8.2 6.9  NEUTROABS 6.8 5.6  HGB 8.7* 8.5*  HCT 26.1* 24.9*  MCV 91.9 90.2  PLT 274 241   No results found for this basename: CKTOTAL:3,CKMB:3,CKMBINDEX:3,TROPONINI:3 in the last 72 hours No components found with this basename: POCBNP:3 No results found for this basename: DDIMER:2 in the last 72 hours No results found for this basename: HGBA1C:2 in the last 72 hours No results found for this basename: CHOL:2,HDL:2,LDLCALC:2,TRIG:2,CHOLHDL:2,LDLDIRECT:2 in the last 72 hours No results found for this basename: TSH,T4TOTAL,FREET3,T3FREE,THYROIDAB in the last 72 hours No results found for this basename: VITAMINB12:2,FOLATE:2,FERRITIN:2,TIBC:2,IRON:2,RETICCTPCT:2 in the last 72 hours  Micro Results: Recent Results (from the past 240 hour(s))  CULTURE, SPUTUM-ASSESSMENT     Status: Normal   Collection Time   08/30/11 12:56 PM      Component Value Range Status Comment   Specimen Description SPUTUM   Final    Special Requests NONE   Final    Sputum evaluation     Final    Value: THIS SPECIMEN IS ACCEPTABLE. RESPIRATORY CULTURE REPORT TO FOLLOW.   Report Status 08/30/2011 FINAL   Final   CULTURE, RESPIRATORY     Status: Normal (Preliminary result)   Collection Time  08/30/11  1:00 PM      Component Value Range Status Comment   Specimen Description SPUTUM   Final    Special Requests NONE   Final    Gram Stain PENDING   Incomplete    Culture Culture reincubated for better growth   Final    Report Status PENDING   Incomplete     Studies/Results: Dg Chest 2 View  08/29/2011  *RADIOLOGY REPORT*  Clinical Data: History of cough and fever.  CHEST - 2 VIEW  Comparison: Multiple priors, most recently 08/26/2011.  Findings: Compared to the recent radiograph from 08/26/2011, there is now an area of ill-defined interstitial prominence and patchy air space disease in the left midlung, and potentially the  left lower lobe (retrocardiac region), as well as the medial aspect of the right lower lobe.  Findings are concerning for multilobar pneumonia.  Small bilateral pleural effusions are noted on the lateral projection.  Pulmonary vasculature is normal.  Heart size is within normal limits.  Mediastinal contours are unremarkable. Atherosclerosis of the thoracic aorta.  Marked gaseous distension of the stomach is again noted.  IMPRESSION: 1.  Interval development of patchy interstitial and airspace disease in the left mid lung and lower lobes of the lungs bilaterally, with small bilateral pleural effusions.  Findings are concerning for developing multilobar pneumonia. 2.  Atherosclerosis. 3.  Persistent gaseous distension of the stomach.  Original Report Authenticated By: Florencia Reasons, M.D.   Dg Chest 2 View  08/26/2011  *RADIOLOGY REPORT*  Clinical Data: Evaluate atelectasis or pneumonia.  CHEST - 2 VIEW  Comparison: 08/20/2011  Findings: Gaseous distention of the stomach and upper abdominal bowel loops underneath the right hemidiaphragm.  Low lung volumes with bibasilar atelectasis.  No effusions.  Heart is normal size. No acute bony abnormality.  IMPRESSION: Low lung volumes with bibasilar atelectasis.  Gaseous distention of the stomach and upper abdominal bowel loops under the right hemidiaphragm.  Original Report Authenticated By: Cyndie Chime, M.D.   Dg Chest 2 View  08/20/2011  *RADIOLOGY REPORT*  Clinical Data: Preop  CHEST - 2 VIEW  Comparison: 07/25/2008  Findings: Cardiomediastinal silhouette is stable.  Mild hyperinflation.  No acute infiltrate or pleural effusion.  No pulmonary edema.  Mild degenerative changes thoracic spine.  IMPRESSION: No active disease.  Original Report Authenticated By: Natasha Mead, M.D.   Dg Hip Complete Right  08/24/2011  *RADIOLOGY REPORT*  Clinical Data: Postop right hip replacement.  PORTABLE PELVIS,RIGHT HIP - COMPLETE 2+ VIEW  Comparison: None.  Findings: Total right  hip replacement appears in satisfactory position.  Surrounding lucencies appear to represent gas rather than fracture.  Prostatic seed implants in place.  Degenerative changes lower lumbar spine.  IMPRESSION: Satisfactory position total right hip replacement.  Original Report Authenticated By: Fuller Canada, M.D.   Dg Abd 1 View  08/26/2011  *RADIOLOGY REPORT*  Clinical Data: Right abdominal pain.  ABDOMEN - 1 VIEW  Comparison: One-view pelvis 08/24/2011.  Abdominal CT 04/03/2004.  Findings: The upper abdomen is excluded.  There is mild diffuse distension of the stomach, small and large bowel.  No bowel wall thickening is identified.  There is no supine evidence of free intraperitoneal air.  Prostatic brachytherapy seeds appear unchanged.  There are stable mild degenerative changes of the lower lumbar spine and left hip.  Patient is status post right hip arthroplasty.  IMPRESSION: Diffuse bowel distension most consistent with ileus.  Correlate clinically.  Original Report Authenticated By: Gerrianne Scale,  M.D.   Dg Esophagus  08/26/2011  *RADIOLOGY REPORT*  Clinical Data: History of aspiration.  Probable Zenker's diverticulum noted during modified barium swallow.  ESOPHOGRAM/BARIUM SWALLOW  Technique:  Single contrast examination was performed using thick barium.  Fluoroscopy time:  1.27 minutes.  Comparison:  No priors.  Findings:  Limited images were obtained with the patient swallowing thickened barium liquid.  Initial images of the thorax prior to swallow attempts demonstrated retained barium contrast within a focal outpouching of the proximal third of the esophagus, consistent with a Zenker's diverticulum.  The remainder of the esophagus was otherwise markedly patulous (distal esophagus was several centimeters in diameter). Multiple swallows were observed during fluoroscopy, and the Zenker's diverticulum repeatedly filled and partially emptied.  IMPRESSION: 1.  Limited single contrast barium esophagram  confirms the presence of a large Zenker's diverticulum.  Original Report Authenticated By: Florencia Reasons, M.D.   Dg Pelvis Portable  08/24/2011  *RADIOLOGY REPORT*  Clinical Data: Postop right hip replacement.  PORTABLE PELVIS,RIGHT HIP - COMPLETE 2+ VIEW  Comparison: None.  Findings: Total right hip replacement appears in satisfactory position.  Surrounding lucencies appear to represent gas rather than fracture.  Prostatic seed implants in place.  Degenerative changes lower lumbar spine.  IMPRESSION: Satisfactory position total right hip replacement.  Original Report Authenticated By: Fuller Canada, M.D.   Dg Swallowing Func-no Report  08/26/2011  CLINICAL DATA: dysphagia   FLUOROSCOPY FOR SWALLOWING FUNCTION STUDY:  Fluoroscopy was provided for swallowing function study, which was  administered by a speech pathologist.  Final results and recommendations  from this study are contained within the speech pathology report.      Medications: I have reviewed the patient's current medications. Scheduled Meds:    . aspirin EC  81 mg Oral Daily  . atorvastatin  40 mg Oral Daily  . ceFEPime (MAXIPIME) IV  1 g Intravenous Q8H  . cholecalciferol  1,000 Units Oral Daily  . docusate sodium  100 mg Oral BID  . enoxaparin  40 mg Subcutaneous Q24H  . levofloxacin (LEVAQUIN) IV  750 mg Intravenous Q24H  . lisinopril  40 mg Oral Daily  . loratadine  10 mg Oral Daily  . topiramate  25 mg Oral Daily  . vancomycin  1,000 mg Intravenous Q12H  . vitamin C  500 mg Oral Daily   Continuous Infusions:  PRN Meds:.food thickener, hydrALAZINE Assessment/Plan: Patient Active Hospital Problem List: Healthcare-associated pneumonia (08/29/2011)   Assessment: Continue IV antibiotics. Pt on day #3 of Levaquin, cefepime and Vancomycin.  Fever (08/29/2011)   Assessment: Resolved.   DIABETES MELLITUS, TYPE II (11/02/2006)   Assessment: Pt and family report that pt does not have diabetes.    Dysphagia   Assessment:  I have spoken with the ENT specialist. His recommendation at this time is that we should hold off on placing the Panda tube until you see the patient in evaluation. He plans to see the patient this evening and make further recommendations on his therapeutic plan.   LOS: 2 days

## 2011-08-31 NOTE — Progress Notes (Signed)
Speech Language/Pathology Patient now NPO until seen by ENT, secondary to severe aspiration risk with any consistency. Spoke with patient, wife, and son regarding need for repeat MBS after Zenker's diverticulum is corrected by ENT.  SLP will defer further evaluation pending ENT consult.  All expressed understanding and agree with plan.  Darius Castro T

## 2011-09-01 ENCOUNTER — Inpatient Hospital Stay (HOSPITAL_COMMUNITY): Payer: Medicare Other

## 2011-09-01 DIAGNOSIS — R1310 Dysphagia, unspecified: Secondary | ICD-10-CM | POA: Diagnosis present

## 2011-09-01 LAB — CBC
Hemoglobin: 8.3 g/dL — ABNORMAL LOW (ref 13.0–17.0)
MCH: 30.1 pg (ref 26.0–34.0)
MCV: 90.6 fL (ref 78.0–100.0)
RBC: 2.76 MIL/uL — ABNORMAL LOW (ref 4.22–5.81)
WBC: 10.1 10*3/uL (ref 4.0–10.5)

## 2011-09-01 LAB — BASIC METABOLIC PANEL
CO2: 28 mEq/L (ref 19–32)
Glucose, Bld: 104 mg/dL — ABNORMAL HIGH (ref 70–99)
Potassium: 3 mEq/L — ABNORMAL LOW (ref 3.5–5.1)
Sodium: 144 mEq/L (ref 135–145)

## 2011-09-01 LAB — CULTURE, RESPIRATORY W GRAM STAIN: Gram Stain: NONE SEEN

## 2011-09-01 NOTE — Progress Notes (Signed)
Text page sent to Dr Ashley Royalty to update her on panda tube not able to be placed by radiology.

## 2011-09-01 NOTE — Progress Notes (Signed)
Nutrition Consult Note - TF recommendations  - Noted MD had recommended panda tube for nutrition. Recommend Glucerna 1.2 start at 40ml/hr increase by 10ml q4hr to goal of 22ml/hr which will provide 1872 calories, 94g protein, 25g fiber, and free water. Since no IVF currently running, recommend water flushes q6hr via panda tube. Will monitor for TF initiation and advancement.   Dietitian # [phone]

## 2011-09-01 NOTE — Progress Notes (Addendum)
Physical Therapy Treatment Patient Details Name: Darius Castro MRN: [id number] DOB: [birth year] Today's Date: 09/01/2011  PT Assessment/Plan  PT - Assessment/Plan Comments on Treatment Session: Pt still requires supplemental O2 to maintain sats above 90%. Panda tube unable to be placed today, pt to transfer to [facility] for GI procedure.  Pt tolerated increased gait distance today, but still fatigues very quickly. He hasn't had nutrition in a week, per pt.  PT Plan: Discharge plan remains appropriate;Frequency remains appropriate PT Frequency: 7X/week Follow Up Recommendations: Skilled nursing facility Equipment Recommended: Defer to next venue PT Goals  Acute Rehab PT Goals PT Goal Formulation: With patient Time For Goal Achievement: 2 weeks Pt will go Supine/Side to Sit: with modified independence PT Goal: Supine/Side to Sit - Progress: Progressing toward goal Pt will go Sit to Stand: with modified independence PT Goal: Sit to Stand - Progress: Progressing toward goal Pt will Ambulate: >150 feet;with modified independence;with rolling walker PT Goal: Ambulate - Progress: Progressing toward goal Pt will Perform Home Exercise Program: Independently PT Goal: Perform Home Exercise Program - Progress: Progressing toward goal Additional Goals Additional Goal #1: pt will verbalize 3/3 hip precautions.   PT Goal: Additional Goal #1 - Progress: Progressing toward goal  PT Treatment Precautions/Restrictions  Precautions Precautions: Posterior Hip Precaution Comments: reviewed precautions with pt, he wasn't able to verbalize any of the posterior THA precautions. Sign is hanging in room. Required Braces or Orthoses: No Restrictions Weight Bearing Restrictions: Yes RLE Weight Bearing: Partial weight bearing RLE Partial Weight Bearing Percentage or Pounds: 50 Mobility (including Balance) Bed Mobility Supine to Sit: 3: Mod assist;With rails Transfers Sit to Stand: 4: Min assist;From elevated  surface;From bed;With upper extremity assist Stand to Sit: 4: Min assist;With upper extremity assist;With armrests;To chair/3-in-1 Stand to Sit Details: VCs for hand placement Ambulation/Gait Ambulation/Gait: Yes Ambulation/Gait Assistance: 4: Min assist Ambulation/Gait Assistance Details (indicate cue type and reason): min/guard for balance, VCs to increase step length, pt ambulated with 4L O2 Ambulation Distance (Feet): 40 Feet Assistive device: Rolling walker Gait Pattern: Step-to pattern;Decreased step length - right;Decreased step length - left;Trunk flexed Stairs: No    Exercise  Total Joint Exercises Ankle Circles/Pumps: AROM;Both;10 reps Short Arc Quad: Other reps (comment);Right;AROM (15 reps) Heel Slides: 15 reps;AAROM;Right Hip ABduction/ADduction: AAROM;10 reps;Supine;Right Straight Leg Raises: AAROM;Right;10 reps End of Session PT - End of Session Equipment Utilized During Treatment: Gait belt Activity Tolerance: Patient limited by fatigue Patient left: in chair;with call bell in reach Nurse Communication: Mobility status for transfers;Mobility status for ambulation General Behavior During Session: [facility] [employer] [facility] for tasks performed Cognition: [facility] for tasks performed  Tamala Ser 09/01/2011, 2:08 PM [phone]

## 2011-09-01 NOTE — Evaluation (Signed)
Occupational Therapy Evaluation Patient Details Name: Darius Castro MRN: [id number] DOB: 1923-10-13 Today's Date: 09/01/2011  Problem List:  Patient Active Problem List  Diagnoses  . DIABETES MELLITUS, TYPE II  . HYPERLIPIDEMIA  . HYPERTENSION  . PROSTATE CANCER, HX OF  . COLONIC POLYPS, HX OF  . Hip pain, right  . Osteoarthritis  . Syncope  . Zenker's hypopharyngeal diverticulum  . Healthcare-associated pneumonia  . Fever    Past Medical History:  Past Medical History  Diagnosis Date  . Diabetes mellitus   . Hyperlipidemia     takes Lipitor daily  . Adenomatous colon polyp 3/09  . Personal history of prostate cancer   . Dysphagia     with pills  . Hypertension     takes Fosinopril daily  . Sleep apnea     sleep study done in 03/30/04 in epic;doesn't use a CPAP  . Pneumonia     hx of as a child  . Arthritis     back and hip  . Dizziness     pt states when he is hot and stands up too fast  . Back pain     hx of ruptured disc  . Dry skin   . Hemorrhoid     internal   . Hx of colonic polyps   . Nocturia   . Cancer     prostate   . Impaired hearing   . 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 Lumbar laminectomy 1998  . Knee arthroscopy 2001  . Rotator cuff repair 2/10    right  . Cataract extraction     bilateral  . Tonsillectomy     as a child  . Colonoscopy   . Radioactive seed implant 2008  . Total hip arthroplasty 08/24/2011    Procedure: TOTAL HIP ARTHROPLASTY;  Surgeon: Nilda Simmer, MD;  Location: [facility] OR;  Service: Orthopedics;  Laterality: Right;  DR Thurston Hole WANTS 90 MINUTES FOR SURGERY    OT Assessment/Plan/Recommendation OT Assessment Clinical Impression Statement: This 78 year old male underwent a R THA on 3/6 and was transfered to [facility] for rehab.  Pt barely started OT and was rehospitalized with pna.  He currently needed max a with LB ADLs and min A for  transfers.  He is appropriate for skilled OT to educate on THPs and increase independence with ADLs.  Goals in acute are min A level. OT Recommendation/Assessment: Patient will need skilled OT in the acute care venue OT Problem List: Decreased strength;Decreased activity tolerance;Impaired balance (sitting and/or standing);Decreased knowledge of use of DME or AE;Decreased knowledge of precautions;Cardiopulmonary status limiting activity OT Therapy Diagnosis : Generalized weakness OT Plan OT Frequency: Min 1X/week OT Treatment/Interventions: Self-care/ADL training;DME and/or AE instruction;Therapeutic activities;Patient/family education;Balance training OT Recommendation Follow Up Recommendations: Skilled nursing facility Equipment Recommended: Defer to next venue Individuals Consulted Consulted and Agree with Results and Recommendations: Patient OT Goals Acute Rehab OT Goals OT Goal Formulation: With patient Time For Goal Achievement: 2 weeks ADL Goals Pt Will Perform Grooming: with min assist;Other (comment);Standing at sink (min guard) ADL Goal: Grooming - Progress: Goal set today Pt Will Perform Lower Body Bathing: with min assist;with adaptive equipment;with cueing (comment type and amount);Sit to stand from chair (min cues for thps) ADL Goal: Lower Body Bathing - Progress: Goal set today Pt Will Perform Lower Body Dressing: with min assist;Sit to stand from chair;with adaptive equipment;with cueing (comment type and amount) (  min a for thps) ADL Goal: Lower Body Dressing - Progress: Goal set today Pt Will Transfer to Toilet: with min assist;Ambulation;3-in-1;Maintaining weight bearing status;Maintaining hip precautions;with cueing (comment type and amount) (min cues; min guard A) ADL Goal: Toilet Transfer - Progress: Goal set today Miscellaneous OT Goals Miscellaneous OT Goal #1: Pt will complete all aspects of toileting with min guard A, min cues for thps OT Goal: Miscellaneous Goal  #1 - Progress: Goal set today Miscellaneous OT Goal #2: Pt will recall 3/3 thps OT Goal: Miscellaneous Goal #2 - Progress: Goal set today  OT Evaluation Precautions/Restrictions  Precautions Precautions: Posterior Hip Restrictions Weight Bearing Restrictions: Yes RLE Weight Bearing: Partial weight bearing RLE Partial Weight Bearing Percentage or Pounds: 50% Prior Functioning Home Living Lives With: Other (Comment) (d/c to friends snf for rehab:  has done very little ) Receives Help From: Other (Comment) (staff)   ADL ADL Eating/Feeding: NPO Grooming: Simulated;Set up Where Assessed - Grooming: Sitting, chair;Supported Upper Body Bathing: Simulated;Set up Where Assessed - Upper Body Bathing: Sitting, chair;Supported Lower Body Bathing: Simulated;Moderate assistance;Other (comment) (educated on long sponge) Where Assessed - Lower Body Bathing: Sit to stand from chair Upper Body Dressing: Simulated;Minimal assistance;Other (comment) (iv) Where Assessed - Upper Body Dressing: Sitting, chair;Supported Lower Body Dressing: Simulated;Performed;Maximal assistance;Other (comment) (sock with sock aid; educated on reacher) Where Assessed - Lower Body Dressing: Sit to stand from chair Toilet Transfer: Simulated;Minimal assistance;Other (comment) (bed to recliner) Toilet Transfer Details (indicate cue type and reason): min cues for hand placement; toes Toilet Transfer Method: Stand pivot Toileting - Clothing Manipulation: Simulated;Moderate assistance Where Assessed - Toileting Clothing Manipulation: Standing Toileting - Hygiene: Simulated;Maximal assistance Where Assessed - Toileting Hygiene: Standing Equipment Used: Reacher;Rolling walker;Sock aid ADL Comments: pt feels weak; hasn't eaten. Recalled 1/3 thps only.  Says he started with OT and then was back here.  Hadn't seen AE yet.  Did not feel strong enough to release a hand in standing.  Assisted to wash peri area Vision/Perception    Vision - History Patient Visual Report: No change from baseline Cognition Cognition Overall Cognitive Status: Appears within functional limits for tasks assessed Orientation Level: Oriented X4 Cognition - Other Comments: needs reinforcement with THPs Sensation/Coordination   Extremity Assessment RUE Assessment RUE Assessment: Within Functional Limits LUE Assessment LUE Assessment: Within Functional Limits Mobility  Bed Mobility Supine to Sit: 3: Mod assist;With rails Sitting - Scoot to Edge of Bed: 3: Mod assist Transfers Sit to Stand: 4: Min assist;From elevated surface;From bed;With upper extremity assist Sit to Stand Details (indicate cue type and reason): min vcs Exercises   End of Session OT - End of Session Activity Tolerance: Patient limited by fatigue Patient left: in chair;with call bell in reach General Behavior During Session: [facility] for tasks performed Cognition: [facility] for tasks performed [facility], OTR/L [phone] 09/01/2011  Darius Castro 09/01/2011, 9:01 AM

## 2011-09-01 NOTE — Progress Notes (Signed)
Subjective: Patient was seen in consultation by Dr. Jenne Pane from ENT. The original plan was for the patient to have his course of antibiotics and proceed with repair of the Zenker's diverticulum. However the patient had several attempts at placing a PEG tube today by interventional radiology which were unsuccessful. In light of this Dr. Jenne Pane has made a decision to proceed with surgical intervention on tomorrow at [facility]. I spoke with Dr. Jenne Pane who has revised his plan to proceed with surgical repair of the diverticulum at [facility] tomorrow.  Objective: Filed Vitals:   09/01/11 0537 09/01/11 1345 09/01/11 1347 09/01/11 1420  BP: 173/75   166/70  Pulse: 69   69  Temp: 98.8 F (37.1 C)   98.4 F (36.9 C)  TempSrc: Oral   Oral  Resp: 19   18  Height:      Weight:      SpO2: 92% 88% 97% 92%   Weight change:   Intake/Output Summary (Last 24 hours) at 09/01/11 2055 Last data filed at 09/01/11 1832  Gross per 24 hour  Intake    450 ml  Output    900 ml  Net   -450 ml    General: Alert, awake, oriented x3, in no acute distress, and is well appearing today. He is using Yankers suction secondary to excessive secretions HEENT: [place]/AT PEERL, EOMI Neck: Trachea midline,  no masses, no thyromegal,y no JVD, no carotid bruit OROPHARYNX:  Moist, No exudate/ erythema/lesions.  Heart: Regular rate and rhythm, without murmurs, rubs, gallops, PMI non-displaced, no heaves or thrills on palpation.  Lungs: Clear to auscultation, no wheezing or rhonchi noted. No increased vocal fremitus resonant to percussion  Abdomen: Soft, nontender, nondistended, positive bowel sounds, no masses no hepatosplenomegaly noted..  Neuro: No focal neurological deficits noted cranial nerves II through XII grossly intact.    Lab Results:  Basename 09/01/11 0525 08/30/11 0500  NA 144 143  K 3.0* 3.3*  CL 108 109  CO2 28 26  GLUCOSE 104* 137*  BUN 21 23  CREATININE 0.84 0.98  CALCIUM 8.1* 8.6  MG -- --  PHOS --  --   No results found for this basename: AST:2,ALT:2,ALKPHOS:2,BILITOT:2,PROT:2,ALBUMIN:2 in the last 72 hours No results found for this basename: LIPASE:2,AMYLASE:2 in the last 72 hours  Basename 09/01/11 0525 08/30/11 0500  WBC 10.1 8.2  NEUTROABS -- 6.8  HGB 8.3* 8.7*  HCT 25.0* 26.1*  MCV 90.6 91.9  PLT 322 274   No results found for this basename: CKTOTAL:3,CKMB:3,CKMBINDEX:3,TROPONINI:3 in the last 72 hours No components found with this basename: POCBNP:3 No results found for this basename: DDIMER:2 in the last 72 hours No results found for this basename: HGBA1C:2 in the last 72 hours No results found for this basename: CHOL:2,HDL:2,LDLCALC:2,TRIG:2,CHOLHDL:2,LDLDIRECT:2 in the last 72 hours No results found for this basename: TSH,T4TOTAL,FREET3,T3FREE,THYROIDAB in the last 72 hours No results found for this basename: VITAMINB12:2,FOLATE:2,FERRITIN:2,TIBC:2,IRON:2,RETICCTPCT:2 in the last 72 hours  Micro Results: Recent Results (from the past 240 hour(s))  CULTURE, SPUTUM-ASSESSMENT     Status: Normal   Collection Time   08/30/11 12:56 PM      Component Value Range Status Comment   Specimen Description SPUTUM   Final    Special Requests NONE   Final    Sputum evaluation     Final    Value: THIS SPECIMEN IS ACCEPTABLE. RESPIRATORY CULTURE REPORT TO FOLLOW.   Report Status 08/30/2011 FINAL   Final   CULTURE, RESPIRATORY  Status: Normal   Collection Time   08/30/11  1:00 PM      Component Value Range Status Comment   Specimen Description SPUTUM   Final    Special Requests NONE   Final    Gram Stain     Final    Value: NO WBC SEEN     RARE SQUAMOUS EPITHELIAL CELLS PRESENT     NO ORGANISMS SEEN   Culture ABUNDANT YEAST CONSISTENT WITH CANDIDA SPECIES   Final    Report Status 09/01/2011 FINAL   Final     Studies/Results: Dg Chest 2 View  08/29/2011  *RADIOLOGY REPORT*  Clinical Data: History of cough and fever.  CHEST - 2 VIEW  Comparison: Multiple priors, most  recently 08/26/2011.  Findings: Compared to the recent radiograph from 08/26/2011, there is now an area of ill-defined interstitial prominence and patchy air space disease in the left midlung, and potentially the left lower lobe (retrocardiac region), as well as the medial aspect of the right lower lobe.  Findings are concerning for multilobar pneumonia.  Small bilateral pleural effusions are noted on the lateral projection.  Pulmonary vasculature is normal.  Heart size is within normal limits.  Mediastinal contours are unremarkable. Atherosclerosis of the thoracic aorta.  Marked gaseous distension of the stomach is again noted.  IMPRESSION: 1.  Interval development of patchy interstitial and airspace disease in the left mid lung and lower lobes of the lungs bilaterally, with small bilateral pleural effusions.  Findings are concerning for developing multilobar pneumonia. 2.  Atherosclerosis. 3.  Persistent gaseous distension of the stomach.  Original Report Authenticated By: Florencia Reasons, M.D.   Dg Chest 2 View  08/26/2011  *RADIOLOGY REPORT*  Clinical Data: Evaluate atelectasis or pneumonia.  CHEST - 2 VIEW  Comparison: 08/20/2011  Findings: Gaseous distention of the stomach and upper abdominal bowel loops underneath the right hemidiaphragm.  Low lung volumes with bibasilar atelectasis.  No effusions.  Heart is normal size. No acute bony abnormality.  IMPRESSION: Low lung volumes with bibasilar atelectasis.  Gaseous distention of the stomach and upper abdominal bowel loops under the right hemidiaphragm.  Original Report Authenticated By: Cyndie Chime, M.D.   Dg Chest 2 View  08/20/2011  *RADIOLOGY REPORT*  Clinical Data: Preop  CHEST - 2 VIEW  Comparison: 07/25/2008  Findings: Cardiomediastinal silhouette is stable.  Mild hyperinflation.  No acute infiltrate or pleural effusion.  No pulmonary edema.  Mild degenerative changes thoracic spine.  IMPRESSION: No active disease.  Original Report  Authenticated By: Natasha Mead, M.D.   Dg Hip Complete Right  08/24/2011  *RADIOLOGY REPORT*  Clinical Data: Postop right hip replacement.  PORTABLE PELVIS,RIGHT HIP - COMPLETE 2+ VIEW  Comparison: None.  Findings: Total right hip replacement appears in satisfactory position.  Surrounding lucencies appear to represent gas rather than fracture.  Prostatic seed implants in place.  Degenerative changes lower lumbar spine.  IMPRESSION: Satisfactory position total right hip replacement.  Original Report Authenticated By: Fuller Canada, M.D.   Dg Abd 1 View  08/26/2011  *RADIOLOGY REPORT*  Clinical Data: Right abdominal pain.  ABDOMEN - 1 VIEW  Comparison: One-view pelvis 08/24/2011.  Abdominal CT 04/03/2004.  Findings: The upper abdomen is excluded.  There is mild diffuse distension of the stomach, small and large bowel.  No bowel wall thickening is identified.  There is no supine evidence of free intraperitoneal air.  Prostatic brachytherapy seeds appear unchanged.  There are stable mild degenerative changes of the lower  lumbar spine and left hip.  Patient is status post right hip arthroplasty.  IMPRESSION: Diffuse bowel distension most consistent with ileus.  Correlate clinically.  Original Report Authenticated By: Gerrianne Scale, M.D.   Dg Esophagus  08/26/2011  *RADIOLOGY REPORT*  Clinical Data: History of aspiration.  Probable Zenker's diverticulum noted during modified barium swallow.  ESOPHOGRAM/BARIUM SWALLOW  Technique:  Single contrast examination was performed using thick barium.  Fluoroscopy time:  1.27 minutes.  Comparison:  No priors.  Findings:  Limited images were obtained with the patient swallowing thickened barium liquid.  Initial images of the thorax prior to swallow attempts demonstrated retained barium contrast within a focal outpouching of the proximal third of the esophagus, consistent with a Zenker's diverticulum.  The remainder of the esophagus was otherwise markedly patulous (distal  esophagus was several centimeters in diameter). Multiple swallows were observed during fluoroscopy, and the Zenker's diverticulum repeatedly filled and partially emptied.  IMPRESSION: 1.  Limited single contrast barium esophagram confirms the presence of a large Zenker's diverticulum.  Original Report Authenticated By: Florencia Reasons, M.D.   Dg Pelvis Portable  08/24/2011  *RADIOLOGY REPORT*  Clinical Data: Postop right hip replacement.  PORTABLE PELVIS,RIGHT HIP - COMPLETE 2+ VIEW  Comparison: None.  Findings: Total right hip replacement appears in satisfactory position.  Surrounding lucencies appear to represent gas rather than fracture.  Prostatic seed implants in place.  Degenerative changes lower lumbar spine.  IMPRESSION: Satisfactory position total right hip replacement.  Original Report Authenticated By: Fuller Canada, M.D.   Dg Swallowing Func-no Report  08/26/2011  CLINICAL DATA: dysphagia   FLUOROSCOPY FOR SWALLOWING FUNCTION STUDY:  Fluoroscopy was provided for swallowing function study, which was  administered by a speech pathologist.  Final results and recommendations  from this study are contained within the speech pathology report.      Medications: I have reviewed the patient's current medications. Scheduled Meds:    . aspirin EC  81 mg Oral Daily  . atorvastatin  40 mg Oral Daily  . ceFEPime (MAXIPIME) IV  1 g Intravenous Q8H  . cholecalciferol  1,000 Units Oral Daily  . docusate sodium  100 mg Oral BID  . enoxaparin  40 mg Subcutaneous Q24H  . levofloxacin (LEVAQUIN) IV  750 mg Intravenous Q24H  . lisinopril  40 mg Oral Daily  . loratadine  10 mg Oral Daily  . topiramate  25 mg Oral Daily  . vancomycin  1,000 mg Intravenous Q12H  . vitamin C  500 mg Oral Daily   Continuous Infusions:  PRN Meds:.food thickener, hydrALAZINE Assessment/Plan: Patient Active Hospital Problem List: Healthcare-associated pneumonia (08/29/2011)   Assessment: Continue IV antibiotics. Pt on  day #4 of Levaquin, cefepime and Vancomycin.  Fever (08/29/2011)   Assessment: Resolved.   DIABETES MELLITUS, TYPE II (11/02/2006)   Assessment: Pt and family report that pt does not have diabetes.    Dysphagia   Assessment: I have spoken with the ENT specialist. In light of the fact that he could not have the PEG tube placed. The patient will be transferred to [facility] where he will undergo investigation and repair of the Zenker's diverticulum tomorrow by Dr. Jenne Pane in the operating room.   Plan is to transfer to [facility] to [facility].  LOS: 3 days

## 2011-09-01 NOTE — Consult Note (Signed)
  PATIENT ID: Darius Castro  MRN: [id number]  DOB/AGE:  1924/06/15 / 78 y.o.    Procedure(s) (LRB): ZENKER'S DIVERTICULECTOMY ENDOSCOPIC (N/A) Right total hip replacement 08-24-2011     PROGRESS NOTE Subjective: Patient is alert, oriented,no Nausea, no Vomiting, yes passing gas, no Bowel Movement. Taking PO no. Denies SOB, Chest or Calf Pain. Using Incentive Spirometer, PAS in place. Ambulate 50% weight bearing Patient reports pain as 5 on 0-10 scale  .    Objective: Vital signs in last 24 hours: Filed Vitals:   08/31/11 2138 09/01/11 0537 09/01/11 1345 09/01/11 1347  BP: 170/76 173/75    Pulse: 70 69    Temp: 99.1 F (37.3 C) 98.8 F (37.1 C)    TempSrc: Oral Oral    Resp: 19 19    Height:      Weight:      SpO2: 94% 92% 88% 97%      Intake/Output from previous day: I/O last 3 completed shifts: In: 1400 [IV Piggyback:1400] Out: 1500 [Urine:1500]   Intake/Output this shift:     LABORATORY DATA:  Basename 09/01/11 0525 08/30/11 0500  WBC 10.1 8.2  HGB 8.3* 8.7*  HCT 25.0* 26.1*  PLT 322 274  NA 144 143  K 3.0* 3.3*  CL 108 109  CO2 28 26  BUN 21 23  CREATININE 0.84 0.98  GLUCOSE 104* 137*  GLUCAP -- --  INR -- --  CALCIUM 8.1* --    Examination: Incision: scant drainage} wound well approximated XR AP&Lat of hip shows well placed\fixed THA  Assessment:     Procedure(s) (LRB): ZENKER'S DIVERTICULECTOMY ENDOSCOPIC (N/A) ADDITIONAL DIAGNOSIS:  Acute Blood Loss Anemia, Hypokalemia, Diabetes, Hypertension and s/p right total hip replacement on 08/24/11  Plan: PT/OT 50% WB, THA  posterior precautions  DVT Prophylaxis: Lovenox  Will follow while in the hospital.

## 2011-09-02 ENCOUNTER — Encounter (HOSPITAL_COMMUNITY): Payer: Self-pay | Admitting: Anesthesiology

## 2011-09-02 ENCOUNTER — Inpatient Hospital Stay (HOSPITAL_COMMUNITY): Payer: Medicare Other | Admitting: Anesthesiology

## 2011-09-02 ENCOUNTER — Encounter (HOSPITAL_COMMUNITY)
Admission: EM | Disposition: A | Discharge status: Skilled Nursing Facility | Payer: Self-pay | Source: Home / Self Care | Attending: Internal Medicine

## 2011-09-02 HISTORY — PX: ZENKER'S DIVERTICULECTOMY: SHX5223

## 2011-09-02 LAB — GLUCOSE, CAPILLARY: Glucose-Capillary: 88 mg/dL (ref 70–99)

## 2011-09-02 LAB — SURGICAL PCR SCREEN
MRSA, PCR: NEGATIVE
Staphylococcus aureus: POSITIVE — AB

## 2011-09-02 SURGERY — ZENKER'S DIVERTICULECTOMY
Anesthesia: General | Site: Throat | Wound class: Clean Contaminated

## 2011-09-02 MED ORDER — ALBUTEROL SULFATE (2.5 MG/3ML) 0.083% IN NEBU
INHALATION_SOLUTION | RESPIRATORY_TRACT | Status: DC | PRN
  Administered 2011-09-02 (×2): 1 mg via RESPIRATORY_TRACT

## 2011-09-02 MED ORDER — CLINDAMYCIN PHOSPHATE 600 MG/4ML IJ SOLN
INTRAMUSCULAR | Status: AC
  Filled 2011-09-02: qty 4

## 2011-09-02 MED ORDER — GLYCOPYRROLATE 0.2 MG/ML IJ SOLN
INTRAMUSCULAR | Status: DC | PRN
  Administered 2011-09-02: .7 mg via INTRAVENOUS

## 2011-09-02 MED ORDER — VANCOMYCIN HCL IN DEXTROSE 1-5 GM/200ML-% IV SOLN: 1000.0000 mg | Freq: Two times a day (BID) | INTRAVENOUS | Status: DC

## 2011-09-02 MED ORDER — SUCCINYLCHOLINE CHLORIDE 20 MG/ML IJ SOLN
INTRAMUSCULAR | Status: DC | PRN
  Administered 2011-09-02: 100 mg via INTRAVENOUS

## 2011-09-02 MED ORDER — FENTANYL CITRATE 0.05 MG/ML IJ SOLN
INTRAMUSCULAR | Status: DC | PRN
  Administered 2011-09-02: 50 ug via INTRAVENOUS

## 2011-09-02 MED ORDER — HYDROMORPHONE HCL PF 1 MG/ML IJ SOLN: 0.2500 mg | INTRAMUSCULAR | Status: DC | PRN

## 2011-09-02 MED ORDER — PHENYLEPHRINE HCL 10 MG/ML IJ SOLN
INTRAMUSCULAR | Status: DC | PRN
  Administered 2011-09-02: 40 ug via INTRAVENOUS

## 2011-09-02 MED ORDER — PROPOFOL 10 MG/ML IV EMUL
INTRAVENOUS | Status: DC | PRN
  Administered 2011-09-02: 100 mg via INTRAVENOUS

## 2011-09-02 MED ORDER — HYDRALAZINE HCL 20 MG/ML IJ SOLN
10.0000 mg | Freq: Three times a day (TID) | INTRAMUSCULAR | Status: DC | PRN
  Filled 2011-09-02: qty 0.5

## 2011-09-02 MED ORDER — FENTANYL CITRATE 0.05 MG/ML IJ SOLN: 50.0000 ug | INTRAMUSCULAR | Status: DC | PRN

## 2011-09-02 MED ORDER — DEXTROSE 5 % IV SOLN
1.0000 g | Freq: Three times a day (TID) | INTRAVENOUS | Status: DC
  Filled 2011-09-02 (×2): qty 1

## 2011-09-02 MED ORDER — LACTATED RINGERS IV SOLN
INTRAVENOUS | Status: DC | PRN
  Administered 2011-09-02: 08:00:00 via INTRAVENOUS

## 2011-09-02 MED ORDER — ONDANSETRON HCL 4 MG/2ML IJ SOLN
INTRAMUSCULAR | Status: DC | PRN
  Administered 2011-09-02: 4 mg via INTRAVENOUS

## 2011-09-02 MED ORDER — LEVOFLOXACIN IN D5W 750 MG/150ML IV SOLN
750.0000 mg | INTRAVENOUS | Status: DC
Start: 2011-09-02 — End: 2011-09-02

## 2011-09-02 MED ORDER — NEOSTIGMINE METHYLSULFATE 1 MG/ML IJ SOLN
INTRAMUSCULAR | Status: DC | PRN
  Administered 2011-09-02: 4 mg via INTRAVENOUS

## 2011-09-02 MED ORDER — POTASSIUM CHLORIDE 20 MEQ/15ML (10%) PO LIQD
40.0000 meq | Freq: Once | ORAL | Status: AC
  Administered 2011-09-02: 40 meq via ORAL
  Filled 2011-09-02: qty 30

## 2011-09-02 MED ORDER — LORAZEPAM 2 MG/ML IJ SOLN
1.0000 mg | Freq: Once | INTRAMUSCULAR | Status: AC | PRN
Start: 2011-09-02 — End: 2011-09-02

## 2011-09-02 MED ORDER — MIDAZOLAM HCL 2 MG/2ML IJ SOLN: 1.0000 mg | INTRAMUSCULAR | Status: DC | PRN

## 2011-09-02 MED ORDER — ENOXAPARIN SODIUM 40 MG/0.4ML ~~LOC~~ SOLN: 40.0000 mg | SUBCUTANEOUS | Status: DC

## 2011-09-02 MED ORDER — DEXTROSE 5 % IV SOLN
1.0000 g | Freq: Two times a day (BID) | INTRAVENOUS | Status: DC
  Administered 2011-09-02 – 2011-09-04 (×4): 1 g via INTRAVENOUS
  Filled 2011-09-02 (×5): qty 1

## 2011-09-02 MED ORDER — ROCURONIUM BROMIDE 100 MG/10ML IV SOLN
INTRAVENOUS | Status: DC | PRN
  Administered 2011-09-02: 15 mg via INTRAVENOUS
  Administered 2011-09-02: 5 mg via INTRAVENOUS

## 2011-09-02 MED ORDER — DEXTROSE 5 % IV SOLN
INTRAVENOUS | Status: AC
  Filled 2011-09-02: qty 50

## 2011-09-02 SURGICAL SUPPLY — 47 items
CATH ROBINSON RED A/P 18FR (CATHETERS) IMPLANT
CLEANER TIP ELECTROSURG 2X2 (MISCELLANEOUS) ×2 IMPLANT
CLOTH BEACON ORANGE TIMEOUT ST (SAFETY) ×2 IMPLANT
COVER SURGICAL LIGHT HANDLE (MISCELLANEOUS) ×2 IMPLANT
COVER TABLE BACK 60X90 (DRAPES) ×2 IMPLANT
CRADLE DONUT ADULT HEAD (MISCELLANEOUS) ×2 IMPLANT
CUTTER LINEAR ENDO 35 ETS (STAPLE) ×2 IMPLANT
DRAIN JACKSON RD 7FR 3/32 (WOUND CARE) IMPLANT
DRAPE PROXIMA HALF (DRAPES) ×2 IMPLANT
ELECT COATED BLADE 2.86 ST (ELECTRODE) ×2 IMPLANT
ELECT REM PT RETURN 9FT ADLT (ELECTROSURGICAL) ×2
ELECTRODE REM PT RTRN 9FT ADLT (ELECTROSURGICAL) ×1 IMPLANT
EVACUATOR SILICONE 100CC (DRAIN) ×2 IMPLANT
GLOVE BIO SURGEON STRL SZ7 (GLOVE) ×4 IMPLANT
GOWN STRL NON-REIN LRG LVL3 (GOWN DISPOSABLE) ×4 IMPLANT
GUARD TEETH (MISCELLANEOUS) ×2 IMPLANT
KIT BASIN OR (CUSTOM PROCEDURE TRAY) ×2 IMPLANT
KIT ROOM TURNOVER OR (KITS) ×2 IMPLANT
MARKER SKIN DUAL TIP RULER LAB (MISCELLANEOUS) ×2 IMPLANT
NEEDLE HYPO 25GX1X1/2 BEV (NEEDLE) ×2 IMPLANT
NS IRRIG 1000ML POUR BTL (IV SOLUTION) ×2 IMPLANT
PAD ARMBOARD 7.5X6 YLW CONV (MISCELLANEOUS) ×4 IMPLANT
PATTIES SURGICAL .5 X3 (DISPOSABLE) ×2 IMPLANT
PENCIL BUTTON HOLSTER BLD 10FT (ELECTRODE) ×2 IMPLANT
RELOAD CUTTER ETS 35MM STAND (ENDOMECHANICALS) ×2 IMPLANT
RELOAD STAPLER LINE PROX 30 GR (STAPLE) IMPLANT
SOLUTION ANTI FOG 6CC (MISCELLANEOUS) ×2 IMPLANT
SPECIMEN JAR SMALL (MISCELLANEOUS) IMPLANT
SPONGE GAUZE 4X4 12PLY (GAUZE/BANDAGES/DRESSINGS) ×2 IMPLANT
SPONGE INTESTINAL PEANUT (DISPOSABLE) ×2 IMPLANT
STAPLER RELOAD LINE PROX 30 GR (STAPLE)
STAPLER VISISTAT 35W (STAPLE) IMPLANT
SURGILUBE 2OZ TUBE FLIPTOP (MISCELLANEOUS) IMPLANT
SUT ETHILON 5 0 P 3 18 (SUTURE)
SUT NYLON ETHILON 5-0 P-3 1X18 (SUTURE) IMPLANT
SUT PROLENE 3 0 RB 1 (SUTURE) IMPLANT
SUT SILK 3 0 (SUTURE) ×1
SUT SILK 3-0 18XBRD TIE 12 (SUTURE) ×1 IMPLANT
SUT VIC AB 4-0 RB1 27 (SUTURE) ×2
SUT VIC AB 4-0 RB1 27X BRD (SUTURE) ×2 IMPLANT
SYR BULB IRRIGATION 50ML (SYRINGE) ×2 IMPLANT
TOWEL OR 17X24 6PK STRL BLUE (TOWEL DISPOSABLE) ×2 IMPLANT
TOWEL OR 17X26 10 PK STRL BLUE (TOWEL DISPOSABLE) ×2 IMPLANT
TRAY ENT MC OR (CUSTOM PROCEDURE TRAY) ×2 IMPLANT
TUBE CONNECTING 12X1/4 (SUCTIONS) ×2 IMPLANT
TUBE FEEDING 10FR FLEXIFLO (MISCELLANEOUS) IMPLANT
WATER STERILE IRR 1000ML POUR (IV SOLUTION) IMPLANT

## 2011-09-02 NOTE — Anesthesia Preprocedure Evaluation (Addendum)
Anesthesia Evaluation  Patient identified by MRN, date of birth, ID band Patient awake    Reviewed: Allergy & Precautions, H&P , NPO status , Patient's Chart, lab work & pertinent test results  Airway Mallampati: II TM Distance: >3 FB Neck ROM: Full    Dental   Pulmonary shortness of breath, sleep apnea ,  + rhonchi   Pulmonary exam normal + decreased breath sounds      Cardiovascular hypertension,     Neuro/Psych    GI/Hepatic   Endo/Other  Diabetes mellitus-  Renal/GU      Musculoskeletal   Abdominal   Peds  Hematology   Anesthesia Other Findings   Reproductive/Obstetrics                          Anesthesia Physical Anesthesia Plan  ASA: III  Anesthesia Plan: General   Post-op Pain Management:    Induction: Intravenous  Airway Management Planned: Oral ETT  Additional Equipment:   Intra-op Plan:   Post-operative Plan: Extubation in OR  Informed Consent: I have reviewed the patients History and Physical, chart, labs and discussed the procedure including the risks, benefits and alternatives for the proposed anesthesia with the patient or authorized representative who has indicated his/her understanding and acceptance.     Plan Discussed with: CRNA and Surgeon  Anesthesia Plan Comments:         Anesthesia Quick Evaluation

## 2011-09-02 NOTE — Anesthesia Postprocedure Evaluation (Signed)
  Anesthesia Post-op Note  Patient: Darius Castro  Procedure(s) Performed: Procedure(s) (LRB): ZENKER'S DIVERTICULECTOMY (N/A)  Patient Location: PACU  Anesthesia Type: General  Level of Consciousness: awake  Airway and Oxygen Therapy: Patient Spontanous Breathing  Post-op Pain: mild  Post-op Assessment: Post-op Vital signs reviewed, Patient's Cardiovascular Status Stable, Respiratory Function Stable, Patent Airway and Pain level controlled  Post-op Vital Signs: stable  Complications: No apparent anesthesia complications

## 2011-09-02 NOTE — Progress Notes (Signed)
ANTIBIOTIC CONSULT NOTE - FOLLOW UP  Pharmacy Consult for Vancomycin + Levaquin + Cefepime Indication: Empiric HCAP coverage  No Known Allergies  Patient Measurements: Height: 5\' 8"  (172.7 cm) Weight: 156 lb 12 oz (71.1 kg) IBW/kg (Calculated) : 68.4   Vital Signs: Temp: 100.3 F (37.9 C) (03/13 0935) Temp src: Oral (03/13 0444) BP: 145/55 mmHg (03/13 0945) Pulse Rate: 70  (03/13 0956) Intake/Output from previous day: 03/12 0701 - 03/13 0700 In: 500 [IV Piggyback:500] Out: 500 [Urine:500] Intake/Output from this shift: Total I/O In: 600 [I.V.:600] Out: -   Labs:  Basename 09/01/11 0525  WBC 10.1  HGB 8.3*  PLT 322  LABCREA --  CREATININE 0.84   Estimated Creatinine Clearance: 62.2 ml/min (by C-G formula based on Cr of 0.84).  Basename 08/31/11 0325 08/30/11 1553  VANCOTROUGH 15.4 14.9  VANCOPEAK -- --  Drue Dun -- --  GENTTROUGH -- --  GENTPEAK -- --  GENTRANDOM -- --  TOBRATROUGH -- --  TOBRAPEAK -- --  TOBRARND -- --  AMIKACINPEAK -- --  AMIKACINTROU -- --  AMIKACIN -- --     Microbiology: Recent Results (from the past 720 hour(s))  SURGICAL PCR SCREEN     Status: Abnormal   Collection Time   08/20/11 10:47 AM      Component Value Range Status Comment   MRSA, PCR NEGATIVE  NEGATIVE  Final    Staphylococcus aureus POSITIVE (*) NEGATIVE  Final   URINE CULTURE     Status: Normal   Collection Time   08/20/11 11:13 AM      Component Value Range Status Comment   Specimen Description URINE, CLEAN CATCH   Final    Special Requests none   Final    Culture  Setup Time [id number]   Final    Colony Count NO GROWTH   Final    Culture NO GROWTH   Final    Report Status 08/21/2011 FINAL   Final   CULTURE, SPUTUM-ASSESSMENT     Status: Normal   Collection Time   08/30/11 12:56 PM      Component Value Range Status Comment   Specimen Description SPUTUM   Final    Special Requests NONE   Final    Sputum evaluation     Final    Value: THIS SPECIMEN IS  ACCEPTABLE. RESPIRATORY CULTURE REPORT TO FOLLOW.   Report Status 08/30/2011 FINAL   Final   CULTURE, RESPIRATORY     Status: Normal   Collection Time   08/30/11  1:00 PM      Component Value Range Status Comment   Specimen Description SPUTUM   Final    Special Requests NONE   Final    Gram Stain     Final    Value: NO WBC SEEN     RARE SQUAMOUS EPITHELIAL CELLS PRESENT     NO ORGANISMS SEEN   Culture ABUNDANT YEAST CONSISTENT WITH CANDIDA SPECIES   Final    Report Status 09/01/2011 FINAL   Final   SURGICAL PCR SCREEN     Status: Abnormal   Collection Time   09/02/11  6:22 AM      Component Value Range Status Comment   MRSA, PCR NEGATIVE  NEGATIVE  Final    Staphylococcus aureus POSITIVE (*) NEGATIVE  Final     Anti-infectives     Start     Dose/Rate Route Frequency Ordered Stop   09/02/11 2200   Levofloxacin (LEVAQUIN) IVPB 750 mg  Status:  Discontinued  750 mg 100 mL/hr over 90 Minutes Intravenous Every 24 hours 09/02/11 0021 09/02/11 0158   09/02/11 0200   ceFEPIme (MAXIPIME) 1 g in dextrose 5 % 50 mL IVPB  Status:  Discontinued        1 g 100 mL/hr over 30 Minutes Intravenous 3 times per day 09/02/11 0021 09/02/11 0156   09/02/11 0021   vancomycin (VANCOCIN) IVPB 1000 mg/200 mL premix  Status:  Discontinued        1,000 mg 200 mL/hr over 60 Minutes Intravenous Every 12 hours 09/02/11 0021 09/02/11 0159   08/30/11 0400   vancomycin (VANCOCIN) IVPB 1000 mg/200 mL premix        1,000 mg 200 mL/hr over 60 Minutes Intravenous Every 12 hours 08/29/11 2046 09/07/11 0359   08/29/11 2200   ceFEPIme (MAXIPIME) 1 g in dextrose 5 % 50 mL IVPB        1 g 100 mL/hr over 30 Minutes Intravenous 3 times per day 08/29/11 2021 09/06/11 2159   08/29/11 2200   Levofloxacin (LEVAQUIN) IVPB 750 mg        750 mg 100 mL/hr over 90 Minutes Intravenous Every 24 hours 08/29/11 2021 09/06/11 2159   08/29/11 1600  piperacillin-tazobactam (ZOSYN) IVPB 3.375 g       3.375 g 12.5 mL/hr over  240 Minutes Intravenous  Once 08/29/11 1523 08/29/11 1942   08/29/11 1600   vancomycin (VANCOCIN) IVPB 1000 mg/200 mL premix        1,000 mg 200 mL/hr over 60 Minutes Intravenous  Once 08/29/11 1523 08/29/11 1727          Assessment: 78 y.o. Castro admitted on 3/9 at [facility] with s/sx of aspiration PNA/HCAP given history of Zenker's diverticulum and dysphagia. The patient has been treated empirically with Vancomycin/Levaquin/Cefepime for 5 days now (since 3/9). Respiratory cultures from 3/10 grew abundant yeast (not speciated). No bacteria has been isolated at this time. Renal function appears stable, SCr 0.84, CrCl~55-60 ml/min. Vancomycin trough taken on 3/11 was therapeutic.   Goal of Therapy:  Vancomycin trough of 15-20 mcg/ml  Plan:  1. Continue Levaquin 750 mg IV every 24 hours 2. Continue Vancomycin 1g IV every 12 hours 3. Will adjust Cefepime to 1g IV every 12 hours 4. Consider de-escalating antibiotics to Unasyn for remainder of duration of treatment 5. Will continue to follow renal function, culture results, LOT, and antibiotic de-escalation plans   Georgina Pillion, PharmD, BCPS Clinical Pharmacist Pager: [phone] 09/02/2011 10:05 AM

## 2011-09-02 NOTE — Preoperative (Signed)
Beta Blockers   Reason not to administer Beta Blockers:Not Applicable, pt not currently taking Beta Blocker

## 2011-09-02 NOTE — Progress Notes (Signed)
Speech Pathology:  Treatment Note  Subjective:  "My throat is sore"  Objective:  Patient seen for f/u after surgical intervention for Zenker's diverticulum. Initial SLP recommendations were for patient to have a repeat MBS following surgery and prior to initiation of pos to determine least restrictive with lowest aspiration risk based on severity of dysphagia with silent aspiration. Clear liquids, thickened to nectar thick initiated per MD this pm. Discussed concerns with ENT MD including high aspiration risk, silent in nature, with increased risk of developing infection given already present PNA. MD wishes for patient to continue on current diet at this time, monitoring for aspiration risk, and to defer MBS at this time. SLP provided verbal education this pm to both RN and patient regarding general safe swallowing strategies to decrease aspiration risk including small sips, multiple swallows, upright positioning during and after intake, and hard cough if presenting with wet vocal quality which could be indicative of penetration and/or aspiration. Both verbalized understanding. Patient remains mildly lethargic this pm, vocal quality hoarse and wet with patient swallowing multiple times during conversation in attempts to clear secretions; therefore was not observed with pos.     Assessment:  See above  Recommendations:  1. Continue current diet per MD, deferring MBS at this time. 2. With pos; small bites and sips, hard effortful swallows, multiple swallows, sit upright at 90 degrees during and after pos to facilitate esophageal clearance of bolus, hard cough if presenting with wet vocal quality.  3. Full supervision with po intake  SLP will f/u for continued education at bedside. Please consider re-ordering objective evaluation if patient continues to present with s/s or indication of aspiration.  Pain:   none Intervention Required:   No   Goals: Progressing  Ferdinand Lango MA,  CCC-SLP [phone]

## 2011-09-02 NOTE — Progress Notes (Signed)
Pt was able to tolerate drinking thickened apple juices, cranberry juice and thickened chicken broth. Pt didn't cough but cleared his throat a few times in between spoonfuls of thickened chicken broth. Pt had full supervision by me when eating this meal. Pt said that he felt that it was going down good and didn't have any difficulties. Pt was taking multiple swallows and was sitting upright at completely awake and alert while eating. Pt has no complaints of pain. Will continue to monitor.

## 2011-09-02 NOTE — Progress Notes (Signed)
Subjective: Patient was seen in consultation by Dr. Jenne Pane from ENT. The original plan was for the patient to have his course of antibiotics and proceed with repair of the Zenker's diverticulum. However the patient had several attempts at placing a PEG tube by interventional radiology which were unsuccessful. In light of this Dr. Jenne Pane  took him to surgery today for repair of the diverticulum.  Pt is drinking thickened liquids and has no complaints.   Objective: Filed Vitals:   09/02/11 1015 09/02/11 1038 09/02/11 1835 09/02/11 2107  BP: 132/53 133/65 161/75 120/67  Pulse: 73 68 74 71  Temp:  98.1 F (36.7 C) 97.5 F (36.4 C) 97.4 F (36.3 C)  TempSrc:  Oral Oral Oral  Resp: 10 16 18 18   Height:      Weight:    70.3 kg (154 lb 15.7 oz)  SpO2: 91% 90% 90% 95%   Weight change:   Intake/Output Summary (Last 24 hours) at 09/02/11 2159 Last data filed at 09/02/11 1846  Gross per 24 hour  Intake   1580 ml  Output    400 ml  Net   1180 ml    General: Alert, awake, oriented x3, in no acute distress, and is well appearing today. He is using Yankers suction secondary to excessive secretions HEENT: Anselmo/AT PEERL, EOMI Neck: Trachea midline,  no masses, no thyromegal,y no JVD, no carotid bruit OROPHARYNX:  Moist, No exudate/ erythema/lesions.  Heart: Regular rate and rhythm, without murmurs, rubs, gallops, PMI non-displaced, no heaves or thrills on palpation.  Lungs: Clear to auscultation, no wheezing or rhonchi noted. No increased vocal fremitus resonant to percussion  Abdomen: Soft, nontender, nondistended, positive bowel sounds, no masses no hepatosplenomegaly noted..  Neuro: No focal neurological deficits noted cranial nerves II through XII grossly intact.    Lab Results:  [facility] 09/01/11 0525  NA 144  K 3.0*  CL 108  CO2 28  GLUCOSE 104*  BUN 21  CREATININE 0.84  CALCIUM 8.1*  MG --  PHOS --   No results found for this basename:  AST:2,ALT:2,ALKPHOS:2,BILITOT:2,PROT:2,ALBUMIN:2 in the last 72 hours No results found for this basename: LIPASE:2,AMYLASE:2 in the last 72 hours  Basename 09/01/11 0525  WBC 10.1  NEUTROABS --  HGB 8.3*  HCT 25.0*  MCV 90.6  PLT 322   No results found for this basename: CKTOTAL:3,CKMB:3,CKMBINDEX:3,TROPONINI:3 in the last 72 hours No components found with this basename: POCBNP:3 No results found for this basename: DDIMER:2 in the last 72 hours No results found for this basename: HGBA1C:2 in the last 72 hours No results found for this basename: CHOL:2,HDL:2,LDLCALC:2,TRIG:2,CHOLHDL:2,LDLDIRECT:2 in the last 72 hours No results found for this basename: TSH,T4TOTAL,FREET3,T3FREE,THYROIDAB in the last 72 hours No results found for this basename: VITAMINB12:2,FOLATE:2,FERRITIN:2,TIBC:2,IRON:2,RETICCTPCT:2 in the last 72 hours  Micro Results: Recent Results (from the past 240 hour(s))  CULTURE, SPUTUM-ASSESSMENT     Status: Normal   Collection Time   08/30/11 12:56 PM      Component Value Range Status Comment   Specimen Description SPUTUM   Final    Special Requests NONE   Final    Sputum evaluation     Final    Value: THIS SPECIMEN IS ACCEPTABLE. RESPIRATORY CULTURE REPORT TO FOLLOW.   Report Status 08/30/2011 FINAL   Final   CULTURE, RESPIRATORY     Status: Normal   Collection Time   08/30/11  1:00 PM      Component Value Range Status Comment   Specimen Description SPUTUM  Final    Special Requests NONE   Final    Gram Stain     Final    Value: NO WBC SEEN     RARE SQUAMOUS EPITHELIAL CELLS PRESENT     NO ORGANISMS SEEN   Culture ABUNDANT YEAST CONSISTENT WITH CANDIDA SPECIES   Final    Report Status 09/01/2011 FINAL   Final   SURGICAL PCR SCREEN     Status: Abnormal   Collection Time   09/02/11  6:22 AM      Component Value Range Status Comment   MRSA, PCR NEGATIVE  NEGATIVE  Final    Staphylococcus aureus POSITIVE (*) NEGATIVE  Final     Studies/Results: Dg Chest 2  View  08/29/2011  *RADIOLOGY REPORT*  Clinical Data: History of cough and fever.  CHEST - 2 VIEW  Comparison: Multiple priors, most recently 08/26/2011.  Findings: Compared to the recent radiograph from 08/26/2011, there is now an area of ill-defined interstitial prominence and patchy air space disease in the left midlung, and potentially the left lower lobe (retrocardiac region), as well as the medial aspect of the right lower lobe.  Findings are concerning for multilobar pneumonia.  Small bilateral pleural effusions are noted on the lateral projection.  Pulmonary vasculature is normal.  Heart size is within normal limits.  Mediastinal contours are unremarkable. Atherosclerosis of the thoracic aorta.  Marked gaseous distension of the stomach is again noted.  IMPRESSION: 1.  Interval development of patchy interstitial and airspace disease in the left mid lung and lower lobes of the lungs bilaterally, with small bilateral pleural effusions.  Findings are concerning for developing multilobar pneumonia. 2.  Atherosclerosis. 3.  Persistent gaseous distension of the stomach.  Original Report Authenticated By: Florencia Reasons, M.D.   Dg Chest 2 View  08/26/2011  *RADIOLOGY REPORT*  Clinical Data: Evaluate atelectasis or pneumonia.  CHEST - 2 VIEW  Comparison: 08/20/2011  Findings: Gaseous distention of the stomach and upper abdominal bowel loops underneath the right hemidiaphragm.  Low lung volumes with bibasilar atelectasis.  No effusions.  Heart is normal size. No acute bony abnormality.  IMPRESSION: Low lung volumes with bibasilar atelectasis.  Gaseous distention of the stomach and upper abdominal bowel loops under the right hemidiaphragm.  Original Report Authenticated By: Cyndie Chime, M.D.   Dg Chest 2 View  08/20/2011  *RADIOLOGY REPORT*  Clinical Data: Preop  CHEST - 2 VIEW  Comparison: 07/25/2008  Findings: Cardiomediastinal silhouette is stable.  Mild hyperinflation.  No acute infiltrate or pleural  effusion.  No pulmonary edema.  Mild degenerative changes thoracic spine.  IMPRESSION: No active disease.  Original Report Authenticated By: Natasha Mead, M.D.   Dg Hip Complete Right  08/24/2011  *RADIOLOGY REPORT*  Clinical Data: Postop right hip replacement.  PORTABLE PELVIS,RIGHT HIP - COMPLETE 2+ VIEW  Comparison: None.  Findings: Total right hip replacement appears in satisfactory position.  Surrounding lucencies appear to represent gas rather than fracture.  Prostatic seed implants in place.  Degenerative changes lower lumbar spine.  IMPRESSION: Satisfactory position total right hip replacement.  Original Report Authenticated By: Fuller Canada, M.D.   Dg Abd 1 View  08/26/2011  *RADIOLOGY REPORT*  Clinical Data: Right abdominal pain.  ABDOMEN - 1 VIEW  Comparison: One-view pelvis 08/24/2011.  Abdominal CT 04/03/2004.  Findings: The upper abdomen is excluded.  There is mild diffuse distension of the stomach, small and large bowel.  No bowel wall thickening is identified.  There is no supine  evidence of free intraperitoneal air.  Prostatic brachytherapy seeds appear unchanged.  There are stable mild degenerative changes of the lower lumbar spine and left hip.  Patient is status post right hip arthroplasty.  IMPRESSION: Diffuse bowel distension most consistent with ileus.  Correlate clinically.  Original Report Authenticated By: Gerrianne Scale, M.D.   Dg Esophagus  08/26/2011  *RADIOLOGY REPORT*  Clinical Data: History of aspiration.  Probable Zenker's diverticulum noted during modified barium swallow.  ESOPHOGRAM/BARIUM SWALLOW  Technique:  Single contrast examination was performed using thick barium.  Fluoroscopy time:  1.27 minutes.  Comparison:  No priors.  Findings:  Limited images were obtained with the patient swallowing thickened barium liquid.  Initial images of the thorax prior to swallow attempts demonstrated retained barium contrast within a focal outpouching of the proximal third of the  esophagus, consistent with a Zenker's diverticulum.  The remainder of the esophagus was otherwise markedly patulous (distal esophagus was several centimeters in diameter). Multiple swallows were observed during fluoroscopy, and the Zenker's diverticulum repeatedly filled and partially emptied.  IMPRESSION: 1.  Limited single contrast barium esophagram confirms the presence of a large Zenker's diverticulum.  Original Report Authenticated By: Florencia Reasons, M.D.   Dg Pelvis Portable  08/24/2011  *RADIOLOGY REPORT*  Clinical Data: Postop right hip replacement.  PORTABLE PELVIS,RIGHT HIP - COMPLETE 2+ VIEW  Comparison: None.  Findings: Total right hip replacement appears in satisfactory position.  Surrounding lucencies appear to represent gas rather than fracture.  Prostatic seed implants in place.  Degenerative changes lower lumbar spine.  IMPRESSION: Satisfactory position total right hip replacement.  Original Report Authenticated By: Fuller Canada, M.D.   Dg Swallowing Func-no Report  08/26/2011  CLINICAL DATA: dysphagia   FLUOROSCOPY FOR SWALLOWING FUNCTION STUDY:  Fluoroscopy was provided for swallowing function study, which was  administered by a speech pathologist.  Final results and recommendations  from this study are contained within the speech pathology report.      Medications: I have reviewed the patient's current medications. Scheduled Meds:    . aspirin EC  81 mg Oral Daily  . atorvastatin  40 mg Oral Daily  . ceFEPime (MAXIPIME) IV  1 g Intravenous Q12H  . cholecalciferol  1,000 Units Oral Daily  . docusate sodium  100 mg Oral BID  . enoxaparin  40 mg Subcutaneous Q24H  . levofloxacin (LEVAQUIN) IV  750 mg Intravenous Q24H  . lisinopril  40 mg Oral Daily  . loratadine  10 mg Oral Daily  . potassium chloride  40 mEq Oral Once  . topiramate  25 mg Oral Daily  . vancomycin  1,000 mg Intravenous Q12H  . vitamin C  500 mg Oral Daily  . DISCONTD: ceFEPime (MAXIPIME) IV  1 g  Intravenous Q8H  . DISCONTD: ceFEPime (MAXIPIME) IV  1 g Intravenous Q8H  . DISCONTD: enoxaparin  40 mg Subcutaneous Q24H  . DISCONTD: levofloxacin (LEVAQUIN) IV  750 mg Intravenous Q24H  . DISCONTD: vancomycin  1,000 mg Intravenous Q12H   Continuous Infusions:  PRN Meds:.fentaNYL, food thickener, hydrALAZINE, HYDROmorphone, LORazepam, midazolam, DISCONTD: hydrALAZINE Assessment/Plan: Patient Active Hospital Problem List: Healthcare-associated pneumonia (08/29/2011)   Assessment: Continue IV antibiotics. Pt on Levaquin, cefepime and Vancomycin.  Fever (08/29/2011)   Assessment: Resolved.   DIABETES MELLITUS, TYPE II (11/02/2006)   Assessment: Pt and family report that pt does not have diabetes.    Dysphagia   Assessment: He has undergone repair of the Zenker's diverticulum and appears to be doing well  so far. Diet to be advanced by ENT and SLP.   Hypokalemia Replaced today. Recheck tomorrow.     LOS: 4 days

## 2011-09-02 NOTE — Progress Notes (Signed)
Speech Language/Pathology Pt to have repair of Zenker's Diverticulum today. Will continue to follow for PO readiness and reassessment. Pt will need MBS prior to initiating POs. Harlon Ditty, MA CCC-SLP [phone].

## 2011-09-02 NOTE — Progress Notes (Signed)
Pt. Received all his due meds., report was called to RN Olegario Messier @ [facility]. [facility] was called for transportation of pt. to [facility]. Transport arrived to the floor @ about 2340pm. Report was given to the [facility] staff & pt. taken off the floor on a strecher in a stable condition.

## 2011-09-02 NOTE — Progress Notes (Signed)
Day of Surgery  Subjective: A nasogastric feeding tube could not be placed by nursing or radiology.  Thus, we have decided to proceed with surgery for the Zenker's diverticulum.  No change in his symptoms or status.  Objective: Vital signs in last 24 hours: Temp:  [97.9 F (36.6 C)-98.4 F (36.9 C)] 98 F (36.7 C) (03/13 0444) Pulse Rate:  [69-73] 69  (03/13 0444) Resp:  [18] 18  (03/13 0444) BP: (155-166)/(62-76) 164/73 mmHg (03/13 0444) SpO2:  [88 %-97 %] 91 % (03/13 0444) Weight:  [71.1 kg (156 lb 12 oz)] 71.1 kg (156 lb 12 oz) (03/13 0020) Last BM Date: 08/30/11  Intake/Output from previous day: 03/12 0701 - 03/13 0700 In: 500 [IV Piggyback:500] Out: 500 [Urine:500] Intake/Output this shift:    General appearance: alert, cooperative and no distress Neck: no scar.  Lab Results:   [facility] 09/01/11 0525  WBC 10.1  HGB 8.3*  HCT 25.0*  PLT 322   BMET  Basename 09/01/11 0525  NA 144  K 3.0*  CL 108  CO2 28  GLUCOSE 104*  BUN 21  CREATININE 0.84  CALCIUM 8.1*   PT/INR No results found for this basename: LABPROT:2,INR:2 in the last 72 hours ABG No results found for this basename: PHART:2,PCO2:2,PO2:2,HCO3:2 in the last 72 hours  Studies/Results: Dg Intro Long Gi Tube  09/01/2011  *RADIOLOGY REPORT*  Clinical Data: Feeding tube placement.  INTRO LONG GI TUBE  Technique: Fluoroscopic images obtained during the feeding tube placement.  Fluoroscopy Time:  8.8 minutes.  Comparison:  No priors.  Findings: Multiple attempts were made in both frontal and lateral projections to pass a 12-French feeding tube into the proximal small bowel.  Unfortunately, the tube extended either into the trachea, or extended posterior within a large Zenker's diverticulum (the tube never extended down into the lower esophagus).  Despite multiple repeat attempts over greater than a 15-minute time period, placement of the feeding tube was unsuccessful.  The tube was withdrawn from the patient.   IMPRESSION: 1.  Unsuccessful attempts and fluoroscopic guided placement of a feeding tube, as above.  Original Report Authenticated By: Florencia Reasons, M.D.    Anti-infectives: Anti-infectives     Start     Dose/Rate Route Frequency Ordered Stop   09/02/11 2200   Levofloxacin (LEVAQUIN) IVPB 750 mg  Status:  Discontinued        750 mg 100 mL/hr over 90 Minutes Intravenous Every 24 hours 09/02/11 0021 09/02/11 0158   09/02/11 0200   ceFEPIme (MAXIPIME) 1 g in dextrose 5 % 50 mL IVPB  Status:  Discontinued        1 g 100 mL/hr over 30 Minutes Intravenous 3 times per day 09/02/11 0021 09/02/11 0156   09/02/11 0021   vancomycin (VANCOCIN) IVPB 1000 mg/200 mL premix  Status:  Discontinued        1,000 mg 200 mL/hr over 60 Minutes Intravenous Every 12 hours 09/02/11 0021 09/02/11 0159   08/30/11 0400   vancomycin (VANCOCIN) IVPB 1000 mg/200 mL premix        1,000 mg 200 mL/hr over 60 Minutes Intravenous Every 12 hours 08/29/11 2046 09/07/11 0359   08/29/11 2200   ceFEPIme (MAXIPIME) 1 g in dextrose 5 % 50 mL IVPB        1 g 100 mL/hr over 30 Minutes Intravenous 3 times per day 08/29/11 2021 09/06/11 2159   08/29/11 2200   Levofloxacin (LEVAQUIN) IVPB 750 mg  750 mg 100 mL/hr over 90 Minutes Intravenous Every 24 hours 08/29/11 2021 09/06/11 2159   08/29/11 1600   piperacillin-tazobactam (ZOSYN) IVPB 3.375 g        3.375 g 12.5 mL/hr over 240 Minutes Intravenous  Once 08/29/11 1523 08/29/11 1942   08/29/11 1600   vancomycin (VANCOCIN) IVPB 1000 mg/200 mL premix        1,000 mg 200 mL/hr over 60 Minutes Intravenous  Once 08/29/11 1523 08/29/11 1727          Assessment/Plan: s/p Procedure(s) (LRB): ZENKER'S DIVERTICULUM, DYSPHAGIA  To OR for endoscopic Zenker's diverticulotomy.  Risks, benefits, and alternatives were discussed including increased anesthesia risk due to pneumonia.  LOS: 4 days    Darius Castro 09/02/2011

## 2011-09-02 NOTE — Progress Notes (Signed)
Utilization review completed. Darius Castro 09/02/2011 

## 2011-09-02 NOTE — Brief Op Note (Signed)
08/29/2011 - 09/02/2011  9:18 AM  PATIENT:  Darius Castro  77 y.o. male  PRE-OPERATIVE DIAGNOSIS:  zenker's diverticulum, dysphagia  POST-OPERATIVE DIAGNOSIS:  same  PROCEDURE:  Procedure(s) (LRB): ENDOSCOPIC ZENKER'S DIVERTICULOTOMY (N/A)  SURGEON:  Surgeon(s) and Role:    * Christia Reading, MD - Primary  PHYSICIAN ASSISTANT:   ASSISTANTS: none   ANESTHESIA:   general  EBL:     BLOOD ADMINISTERED:none  DRAINS: none   LOCAL MEDICATIONS USED:  NONE  SPECIMEN:  No Specimen  DISPOSITION OF SPECIMEN:  N/A  COUNTS:  YES  TOURNIQUET:  * No tourniquets in log *  DICTATION: .Other Dictation: Dictation Number [id number]  PLAN OF CARE: Return to hospital room.  PATIENT DISPOSITION:  PACU - hemodynamically stable.   Delay start of Pharmacological VTE agent (>24hrs) due to surgical blood loss or risk of bleeding: no

## 2011-09-02 NOTE — Transfer of Care (Signed)
Immediate Anesthesia Transfer of Care Note  Patient: Darius Castro  Procedure(s) Performed: Procedure(s) (LRB): ZENKER'S DIVERTICULECTOMY (N/A)  Patient Location: PACU  Anesthesia Type: General  Level of Consciousness: awake, oriented and patient cooperative  Airway & Oxygen Therapy: Spontaneously breathing with oxygen via FM  Post-op Assessment: Report given to PACU RN and Post -op Vital signs reviewed and stable  Post vital signs: Reviewed  Complications: No apparent anesthesia complications

## 2011-09-02 NOTE — Progress Notes (Signed)
Received pt  Via  [facility]  , was transported  From  [facility] .  Patient  Is alert  And  Oriented x  4.    Per  Patient  He  Is  Scheduled  For  Surgery  In  Am   Per  Dr, bates

## 2011-09-02 NOTE — Progress Notes (Signed)
PT Cancellation Note  Treatment cancelled today due to pt to OR for Peg placement per RN.  Will hold PT at this time and try another time.  Thanks.    [name], [place] [phone] 09/02/2011, 8:01 AM

## 2011-09-03 ENCOUNTER — Inpatient Hospital Stay (HOSPITAL_COMMUNITY): Payer: Medicare Other

## 2011-09-03 LAB — MAGNESIUM: Magnesium: 1.7 mg/dL (ref 1.5–2.5)

## 2011-09-03 LAB — BASIC METABOLIC PANEL
CO2: 29 mEq/L (ref 19–32)
Calcium: 7.7 mg/dL — ABNORMAL LOW (ref 8.4–10.5)
Creatinine, Ser: 0.77 mg/dL (ref 0.50–1.35)
GFR calc non Af Amer: 80 mL/min — ABNORMAL LOW (ref >90)

## 2011-09-03 MED ORDER — DOCUSATE SODIUM 50 MG/5ML PO LIQD
100.0000 mg | Freq: Two times a day (BID) | ORAL | Status: DC
  Administered 2011-09-03 – 2011-09-04 (×2): 100 mg via ORAL
  Filled 2011-09-03 (×4): qty 10

## 2011-09-03 MED ORDER — ASPIRIN 81 MG PO CHEW
81.0000 mg | CHEWABLE_TABLET | Freq: Once | ORAL | Status: DC
  Filled 2011-09-03: qty 1

## 2011-09-03 NOTE — Progress Notes (Signed)
Speech Language/Pathology Speech Pathology: Dysphagia Treatment Note  Patient was observed with :  Nectar thick liquids.  Patient was noted to have s/s of aspiration : Yes:  Multiple swallows, delayed cough, wet vocal quality  Lung Sounds:  Rhonchi  Temperature: afebrile  Patient sitting up in bed consuming thickened broth. Pt reports he is not in any pain with swallow. Mild wet vocal quality observed. Multiple swallow and delayed cough/throat clear. SLP provided education to pt and RN regarding safe swallow strategies.   Clinical Impression: Subjectively, pt with much improved swallow function post procedure for correction of Zenker's. However, pt continues to demonstrate signs indicative of pharyngeal residuals and aspiration/penetration (wet vocal quality, multiple swallows, delayed cough). Pt with possible presence of edema and secondary pharyngeal/esophageal dysphagia component in conjunction with now corrected Zenker's.  Recommendations:  Consider objective testing, MBS, prior to upgrade to thin liquids given high risk of silent aspiration (history of decreased laryngeal sensation of aspirate) and upgrade to solid consistency. Objective test may offer appropriate compensatory strategies/best diet that will reduce risk of aspiration if necessary in this deconditioned pt with pna. Decisions regarding diet deferred to ENT per his wishes.   Pain:   none Intervention Required:   No   Goals: Goals Partially Met

## 2011-09-03 NOTE — Procedures (Addendum)
Modified Barium Swallow Procedure Note Patient Details  Name: Darius Castro MRN: [id number] Date of Birth: 1924/05/17  Today's Date: 09/03/2011 Time:  -     Past Medical History:  Past Medical History  Diagnosis Date  . Diabetes mellitus   . Hyperlipidemia     takes Lipitor daily  . Adenomatous colon polyp 3/09  . Personal history of prostate cancer   . Dysphagia     with pills  . Hypertension     takes Fosinopril daily  . Sleep apnea     sleep study done in 03/30/04 in epic;doesn't use a CPAP  . Pneumonia     hx of as a child  . Arthritis     back and hip  . Dizziness     pt states when he is hot and stands up too fast  . Back pain     hx of ruptured disc  . Dry skin   . Hemorrhoid     internal   . Hx of colonic polyps   . Nocturia   . Cancer     prostate   . Impaired hearing   . 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 Lumbar laminectomy 1998  . Knee arthroscopy 2001  . Rotator cuff repair 2/10    right  . Cataract extraction     bilateral  . Tonsillectomy     as a child  . Colonoscopy   . Radioactive seed implant 2008  . Total hip arthroplasty 08/24/2011    Procedure: TOTAL HIP ARTHROPLASTY;  Surgeon: Nilda Simmer, MD;  Location: [facility] OR;  Service: Orthopedics;  Laterality: Right;  DR Anne Ng 90 MINUTES FOR SURGERY   HPI:  Pt is an 78 year old male admitted with bilateral pna with recent admit for total hip arthroplasty on 08/25/11 after which pt was discovered to have a chornic dysphagia due to Zenker's diverticulum. Pt underwent diverticulotomy with Dr. Jenne Pane on 09/02/10, f/u MBS to determine readiness for diet upgrade to thin liquids, solids.       Recommendation/Prognosis  Clinical Impression Dysphagia Diagnosis: Mild cervical esophageal phase dysphagia;Mild pharyngeal phase dysphagia Clinical impression: [name] shows significant improvement in swallow function s/p  diverticulotomy with a persisting mild sensory based pharyngeal dyshpagia and a mild cervical esophageal dysphagia. The pt exhibits a delay in swallow initation to the pyrifrom sinuses with flash penetration occuring with thin liquids with very large bolus trials. The pt also demonstrates moderate residue in cervical esophagus with dry solid boluses that backflows to pharynx post swallow, increasing aspiration risk. A head turn to the left appears to improve transit of bolus, followed by a liquid wash. At this time the pt is recommended to initate thin liquids and moist purees with f/u by SLP to encourage compensatory strategies for upgrade to moist mechanical soft diet.  Swallow Evaluation Recommendations Solid Consistency: Dysphagia 1 (Puree) Liquid Consistency: Thin Liquid Administration via: Cup;No straw Medication Administration: Crushed with puree Supervision: Patient able to self feed Compensations: Slow rate;Small sips/bites;Multiple dry swallows after each bite/sip;Follow solids with liquid Postural Changes and/or Swallow Maneuvers: Head turn left during swallow;Seated upright 90 degrees;Upright 30-60 min after meal Oral Care Recommendations: Oral care BID Follow up Recommendations: Skilled Nursing facility Prognosis Prognosis for Safe Diet Advancement: Good    SLP Goals  SLP Swallowing Goals Patient will consume recommended diet without observed clinical signs of aspiration with: Minimal assistance Patient will utilize recommended  strategies during swallow to increase swallowing safety with: Minimal cueing  Harlon Ditty, MA CCC-SLP [phone]  Claudine Mouton 09/03/2011, 12:07 PM

## 2011-09-03 NOTE — Progress Notes (Signed)
Physical Therapy Treatment Patient Details Name: Darius Castro MRN: [id number] DOB: 02-Apr-1924 Today's Date: 09/03/2011  PT Assessment/Plan  PT - Assessment/Plan Comments on Treatment Session: Patient excited about potential d/c tomorrow back to [facility].  Able to slightly increase distance today.  Still fatigues very quickly.  Updated frequency due to discharge disposition and readmission for PNA with THA done over a week ago.  Able to state 2 hip precautions. PT Plan: Discharge plan remains appropriate;Frequency needs to be updated PT Frequency: Min 5X/week Follow Up Recommendations: Skilled nursing facility Equipment Recommended: Defer to next venue PT Goals  Acute Rehab PT Goals Pt will go Sit to Stand: with modified independence PT Goal: Sit to Stand - Progress: Progressing toward goal Pt will Ambulate: >150 feet;with rolling walker;with modified independence PT Goal: Ambulate - Progress: Progressing toward goal Pt will Perform Home Exercise Program: Independently PT Goal: Perform Home Exercise Program - Progress: Progressing toward goal Additional Goals Additional Goal #1: pt will verbalize 3/3 hip precautions PT Goal: Additional Goal #1 - Progress: Progressing toward goal  PT Treatment Precautions/Restrictions  Precautions Precautions: Posterior Hip Required Braces or Orthoses: No Restrictions Weight Bearing Restrictions: Yes RLE Weight Bearing: Partial weight bearing RLE Partial Weight Bearing Percentage or Pounds: 50% Mobility (including Balance) Bed Mobility Bed Mobility: No (patient up in chair) Transfers Sit to Stand: 4: Min assist;With upper extremity assist;From chair/3-in-1 Sit to Stand Details (indicate cue type and reason): lifting assist Stand to Sit: 4: Min assist;Without upper extremity assist;To chair/3-in-1 Stand to Sit Details: cues for safety and assist to lower into chair Ambulation/Gait Ambulation/Gait Assistance: 4: Min assist Ambulation/Gait  Assistance Details (indicate cue type and reason): cues for PWB; short shuffling steps, and several rest breaks to perform pursed lip breathing; on 3L O2 Ambulation Distance (Feet): 45 Feet Assistive device: Rolling walker Gait Pattern: Shuffle;Decreased stride length;Step-to pattern    Exercise  Total Joint Exercises Ankle Circles/Pumps: AROM;Both;10 reps;Seated Quad Sets: AROM;Both;10 reps;Seated Heel Slides: AAROM;Right;10 reps;Seated Hip ABduction/ADduction: AROM;Right;10 reps;Seated Long Arc Quad: AROM;Right;10 reps;Seated End of Session PT - End of Session Equipment Utilized During Treatment: Gait belt Activity Tolerance: Patient limited by fatigue Patient left: in chair;with call bell in reach General Behavior During Session: [facility] for tasks performed Cognition: [facility] for tasks performed  [facility] 09/03/2011, 12:54 PM

## 2011-09-03 NOTE — Progress Notes (Addendum)
Patient ID: Darius Castro, male   DOB: Jul 08, 1923, 78 y.o.   MRN: [id number] PATIENT ID: Darius Castro  MRN: [id number]  DOB/AGE:  September 18, 1923 / 78 y.o.  1 Day Post-Op Procedure(s) (LRB): ZENKER'S DIVERTICULECTOMY (N/A)    PROGRESS NOTE Subjective: Patient is alert, oriented,noNausea, no Vomiting, yes passing gas, 2-3 days ago Bowel Movement. Taking PO nectar thick liquids2 Denies SOB, Chest or Calf Pain. Using Incentive Spirometer,  Ambulate not since neck surgery Patient reports pain as 1 on 0-10 scale  .    Objective: Vital signs in last 24 hours: Filed Vitals:   09/02/11 1038 09/02/11 1835 09/02/11 2107 09/03/11 0545  BP: 133/65 161/75 120/67 153/64  Pulse: 68 74 71 69  Temp: 98.1 F (36.7 C) 97.5 F (36.4 C) 97.4 F (36.3 C) 97.4 F (36.3 C)  TempSrc: Oral Oral Oral Oral  Resp: 16 18 18 16   Height:      Weight:   70.3 kg (154 lb 15.7 oz)   SpO2: 90% 90% 95% 94%      Intake/Output from previous day: I/O last 3 completed shifts: In: 1580 [P.O.:480; I.V.:600; IV Piggyback:500] Out: 500 [Urine:500]   Intake/Output this shift:     LABORATORY DATA:  Basename 09/03/11 0535 09/02/11 0940 09/01/11 0525  WBC -- -- 10.1  HGB -- -- 8.3*  HCT -- -- 25.0*  PLT -- -- 322  NA 143 -- 144  K 3.2* -- 3.0*  CL 109 -- 108  CO2 29 -- 28  BUN 17 -- 21  CREATININE 0.77 -- 0.84  GLUCOSE 150* -- 104*  GLUCAP -- 88 --  INR -- -- --  CALCIUM 7.7* -- --    Examination: ABD soft Neurovascular intact Sensation intact distally Intact pulses distally Incision: scant drainage} XR AP&Lat of hip shows well placed\fixed THA  Assessment:   1 Day Post-Op Procedure(s) (LRB): ZENKER'S DIVERTICULECTOMY (N/A) ADDITIONAL DIAGNOSIS:  Acute Blood Loss Anemia, Hypokalemia, Hypertension and dysphagia, s/p right total hip replacement on 08/24/2011  Plan: PT/OT 50% WB, THA  posterior precautions  DVT Prophylaxis: Lovenox  DISCHARGE PLAN: Skilled Nursing Facility/Rehab  Resume PT and OT today  for right total hip.  Needs mepelex dressing to right hip, knee high TED hose

## 2011-09-03 NOTE — Progress Notes (Signed)
Patient ID: Darius Castro  male  WUJ:[id number]    DOB: 04/11/1924    DOA: 08/29/2011  PCP: [name], MD, MD  Subjective: Feels a lot better today, passed a swallow study, started on dysphagia 1 diet  Objective: Weight change: -0.8 kg (-1 lb 12.2 oz)  Intake/Output Summary (Last 24 hours) at 09/03/11 1427 Last data filed at 09/03/11 0900  Gross per 24 hour  Intake   1170 ml  Output    450 ml  Net    720 ml   Blood pressure 128/69, pulse 70, temperature 98.5 F (36.9 C), temperature source Oral, resp. rate 18, height 5\' 8"  (1.727 m), weight 70.3 kg (154 lb 15.7 oz), SpO2 94.00%.  Physical Exam: General: Alert and awake, oriented x3, not in any acute distress. HEENT: anicteric sclera, pupils reactive to light and accommodation, EOMI CVS: S1-S2 clear, no murmur rubs or gallops Chest: clear to auscultation bilaterally, no wheezing, rales or rhonchi Abdomen: soft nontender, nondistended, normal bowel sounds, no organomegaly Extremities: no cyanosis, clubbing or edema noted bilaterally Neuro: Cranial nerves II-XII intact, no focal neurological deficits  Lab Results: Basic Metabolic Panel:  Lab 09/03/11 9562 09/01/11 0525  NA 143 144  K 3.2* 3.0*  CL 109 108  CO2 29 28  GLUCOSE 150* 104*  BUN 17 21  CREATININE 0.77 0.84  CALCIUM 7.7* 8.1*  MG 1.7 --  PHOS -- --   Liver Function Tests:  Lab 08/29/11 1300  AST 56*  ALT 53  ALKPHOS 52  BILITOT 0.7  PROT 5.7*  ALBUMIN 2.5*  CBC:  Lab 09/01/11 0525 08/30/11 0500  WBC 10.1 8.2  NEUTROABS -- 6.8  HGB 8.3* 8.7*  HCT 25.0* 26.1*  MCV 90.6 91.9  PLT 322 274   CBG:  Lab 09/02/11 0940  GLUCAP 88     Micro Results: Recent Results (from the past 240 hour(s))  CULTURE, SPUTUM-ASSESSMENT     Status: Normal   Collection Time   08/30/11 12:56 PM      Component Value Range Status Comment   Specimen Description SPUTUM   Final    Special Requests NONE   Final    Sputum evaluation     Final    Value: THIS  SPECIMEN IS ACCEPTABLE. RESPIRATORY CULTURE REPORT TO FOLLOW.   Report Status 08/30/2011 FINAL   Final   CULTURE, RESPIRATORY     Status: Normal   Collection Time   08/30/11  1:00 PM      Component Value Range Status Comment   Specimen Description SPUTUM   Final    Special Requests NONE   Final    Gram Stain     Final    Value: NO WBC SEEN     RARE SQUAMOUS EPITHELIAL CELLS PRESENT     NO ORGANISMS SEEN   Culture ABUNDANT YEAST CONSISTENT WITH CANDIDA SPECIES   Final    Report Status 09/01/2011 FINAL   Final   SURGICAL PCR SCREEN     Status: Abnormal   Collection Time   09/02/11  6:22 AM      Component Value Range Status Comment   MRSA, PCR NEGATIVE  NEGATIVE  Final    Staphylococcus aureus POSITIVE (*) NEGATIVE  Final     Studies/Results: Dg Chest 2 View  08/29/2011  *RADIOLOGY REPORT*  Clinical Data: History of cough and fever.  CHEST - 2 VIEW  Comparison: Multiple priors, most recently 08/26/2011.  Findings: Compared to the recent radiograph from 08/26/2011, there is  now an area of ill-defined interstitial prominence and patchy air space disease in the left midlung, and potentially the left lower lobe (retrocardiac region), as well as the medial aspect of the right lower lobe.  Findings are concerning for multilobar pneumonia.  Small bilateral pleural effusions are noted on the lateral projection.  Pulmonary vasculature is normal.  Heart size is within normal limits.  Mediastinal contours are unremarkable. Atherosclerosis of the thoracic aorta.  Marked gaseous distension of the stomach is again noted.  IMPRESSION: 1.  Interval development of patchy interstitial and airspace disease in the left mid lung and lower lobes of the lungs bilaterally, with small bilateral pleural effusions.  Findings are concerning for developing multilobar pneumonia. 2.  Atherosclerosis. 3.  Persistent gaseous distension of the stomach.  Original Report Authenticated By: Florencia Reasons, M.D.   Dg Chest 2  View  08/26/2011  *RADIOLOGY REPORT*  Clinical Data: Evaluate atelectasis or pneumonia.  CHEST - 2 VIEW  Comparison: 08/20/2011  Findings: Gaseous distention of the stomach and upper abdominal bowel loops underneath the right hemidiaphragm.  Low lung volumes with bibasilar atelectasis.  No effusions.  Heart is normal size. No acute bony abnormality.  IMPRESSION: Low lung volumes with bibasilar atelectasis.  Gaseous distention of the stomach and upper abdominal bowel loops under the right hemidiaphragm.  Original Report Authenticated By: Cyndie Chime, M.D.   Dg Chest 2 View  08/20/2011  *RADIOLOGY REPORT*  Clinical Data: Preop  CHEST - 2 VIEW  Comparison: 07/25/2008  Findings: Cardiomediastinal silhouette is stable.  Mild hyperinflation.  No acute infiltrate or pleural effusion.  No pulmonary edema.  Mild degenerative changes thoracic spine.  IMPRESSION: No active disease.  Original Report Authenticated By: Natasha Mead, M.D.   Dg Hip Complete Right  08/24/2011  *RADIOLOGY REPORT*  Clinical Data: Postop right hip replacement.  PORTABLE PELVIS,RIGHT HIP - COMPLETE 2+ VIEW  Comparison: None.  Findings: Total right hip replacement appears in satisfactory position.  Surrounding lucencies appear to represent gas rather than fracture.  Prostatic seed implants in place.  Degenerative changes lower lumbar spine.  IMPRESSION: Satisfactory position total right hip replacement.  Original Report Authenticated By: Fuller Canada, M.D.   Dg Abd 1 View  08/26/2011  *RADIOLOGY REPORT*  Clinical Data: Right abdominal pain.  ABDOMEN - 1 VIEW  Comparison: One-view pelvis 08/24/2011.  Abdominal CT 04/03/2004.  Findings: The upper abdomen is excluded.  There is mild diffuse distension of the stomach, small and large bowel.  No bowel wall thickening is identified.  There is no supine evidence of free intraperitoneal air.  Prostatic brachytherapy seeds appear unchanged.  There are stable mild degenerative changes of the lower lumbar  spine and left hip.  Patient is status post right hip arthroplasty.  IMPRESSION: Diffuse bowel distension most consistent with ileus.  Correlate clinically.  Original Report Authenticated By: Gerrianne Scale, M.D.   Dg Esophagus  08/26/2011  *RADIOLOGY REPORT*  Clinical Data: History of aspiration.  Probable Zenker's diverticulum noted during modified barium swallow.  ESOPHOGRAM/BARIUM SWALLOW  Technique:  Single contrast examination was performed using thick barium.  Fluoroscopy time:  1.27 minutes.  Comparison:  No priors.  Findings:  Limited images were obtained with the patient swallowing thickened barium liquid.  Initial images of the thorax prior to swallow attempts demonstrated retained barium contrast within a focal outpouching of the proximal third of the esophagus, consistent with a Zenker's diverticulum.  The remainder of the esophagus was otherwise markedly patulous (distal esophagus  was several centimeters in diameter). Multiple swallows were observed during fluoroscopy, and the Zenker's diverticulum repeatedly filled and partially emptied.  IMPRESSION: 1.  Limited single contrast barium esophagram confirms the presence of a large Zenker's diverticulum.  Original Report Authenticated By: Florencia Reasons, M.D.   Dg Pelvis Portable  08/24/2011  *RADIOLOGY REPORT*  Clinical Data: Postop right hip replacement.  PORTABLE PELVIS,RIGHT HIP - COMPLETE 2+ VIEW  Comparison: None.  Findings: Total right hip replacement appears in satisfactory position.  Surrounding lucencies appear to represent gas rather than fracture.  Prostatic seed implants in place.  Degenerative changes lower lumbar spine.  IMPRESSION: Satisfactory position total right hip replacement.  Original Report Authenticated By: Fuller Canada, M.D.   Dg Intro Long Gi Tube  09/01/2011  *RADIOLOGY REPORT*  Clinical Data: Feeding tube placement.  INTRO LONG GI TUBE  Technique: Fluoroscopic images obtained during the feeding tube placement.   Fluoroscopy Time:  8.8 minutes.  Comparison:  No priors.  Findings: Multiple attempts were made in both frontal and lateral projections to pass a 12-French feeding tube into the proximal small bowel.  Unfortunately, the tube extended either into the trachea, or extended posterior within a large Zenker's diverticulum (the tube never extended down into the lower esophagus).  Despite multiple repeat attempts over greater than a 15-minute time period, placement of the feeding tube was unsuccessful.  The tube was withdrawn from the patient.  IMPRESSION: 1.  Unsuccessful attempts and fluoroscopic guided placement of a feeding tube, as above.  Original Report Authenticated By: Florencia Reasons, M.D.   Dg Swallowing Func-no Report  09/03/2011  CLINICAL DATA: dysphagia   FLUOROSCOPY FOR SWALLOWING FUNCTION STUDY:  Fluoroscopy was provided for swallowing function study, which was  administered by a speech pathologist.  Final results and recommendations  from this study are contained within the speech pathology report.     Dg Swallowing Func-no Report  08/26/2011  CLINICAL DATA: dysphagia   FLUOROSCOPY FOR SWALLOWING FUNCTION STUDY:  Fluoroscopy was provided for swallowing function study, which was  administered by a speech pathologist.  Final results and recommendations  from this study are contained within the speech pathology report.      Medications: Scheduled Meds:   . aspirin  81 mg Oral Once  . aspirin EC  81 mg Oral Daily  . atorvastatin  40 mg Oral Daily  . ceFEPime (MAXIPIME) IV  1 g Intravenous Q12H  . cholecalciferol  1,000 Units Oral Daily  . docusate  100 mg Oral BID  . enoxaparin  40 mg Subcutaneous Q24H  . levofloxacin (LEVAQUIN) IV  750 mg Intravenous Q24H  . lisinopril  40 mg Oral Daily  . loratadine  10 mg Oral Daily  . potassium chloride  40 mEq Oral Once  . topiramate  25 mg Oral Daily  . vancomycin  1,000 mg Intravenous Q12H  . vitamin C  500 mg Oral Daily  . DISCONTD: docusate  sodium  100 mg Oral BID   Continuous Infusions:    Assessment/Plan: Active Problems:  Healthcare associated pneumonia: - Continue IV antibiotics, day #5, will transitioned to by mouth antibiotics in a.m.   DIABETES MELLITUS, TYPE II: - Stable   Hip pain, right/ Osteoarthritis: Stable   Zenker's hypopharyngeal diverticulum: undergone repair of the Zenker's diverticulum, postop day #1 and appears to be doing well so far  - Evaluated by Dr. Jenne Pane in a.m. Today, no acute issues, follow up outpatient in 2 weeks    Dysphagia: Passed  swallow test today, started on dysphagia 1 diet  DVT Prophylaxis: Lovenox  Code Status: Full code  Disposition: Hopefully DC to SNF in a.m, if tolerating dysphagia 1 diet without any complications.   LOS: 5 days   Aairah Negrette M.D. [facility] 09/03/2011, 2:27 PM Pager: [phone]

## 2011-09-03 NOTE — Progress Notes (Signed)
1 Day Post-Op  Subjective: Doing great after surgery.  Not much throat pain.  Was able to tolerate nectar thick diet last night and this morning with ease and with no significant cough.  Objective: Vital signs in last 24 hours: Temp:  [97.4 F (36.3 C)-100.3 F (37.9 C)] 97.4 F (36.3 C) (03/14 0545) Pulse Rate:  [68-76] 69  (03/14 0545) Resp:  [10-21] 16  (03/14 0545) BP: (120-161)/(53-75) 153/64 mmHg (03/14 0545) SpO2:  [90 %-96 %] 94 % (03/14 0545) Weight:  [70.3 kg (154 lb 15.7 oz)] 70.3 kg (154 lb 15.7 oz) (03/13 2107) Last BM Date: 09/01/11  Intake/Output from previous day: 03/13 0701 - 03/14 0700 In: 1530 [P.O.:480; I.V.:600; IV Piggyback:450] Out: 500 [Urine:500] Intake/Output this shift:    General appearance: alert, cooperative, no distress and normal voice and continued productive cough.  Lab Results:   [facility] 09/01/11 0525  WBC 10.1  HGB 8.3*  HCT 25.0*  PLT 322   BMET  Basename 09/03/11 0535 09/01/11 0525  NA 143 144  K 3.2* 3.0*  CL 109 108  CO2 29 28  GLUCOSE 150* 104*  BUN 17 21  CREATININE 0.77 0.84  CALCIUM 7.7* 8.1*   PT/INR No results found for this basename: LABPROT:2,INR:2 in the last 72 hours ABG No results found for this basename: PHART:2,PCO2:2,PO2:2,HCO3:2 in the last 72 hours  Studies/Results: Dg Intro Long Gi Tube  09/01/2011  *RADIOLOGY REPORT*  Clinical Data: Feeding tube placement.  INTRO LONG GI TUBE  Technique: Fluoroscopic images obtained during the feeding tube placement.  Fluoroscopy Time:  8.8 minutes.  Comparison:  No priors.  Findings: Multiple attempts were made in both frontal and lateral projections to pass a 12-French feeding tube into the proximal small bowel.  Unfortunately, the tube extended either into the trachea, or extended posterior within a large Zenker's diverticulum (the tube never extended down into the lower esophagus).  Despite multiple repeat attempts over greater than a 15-minute time period, placement  of the feeding tube was unsuccessful.  The tube was withdrawn from the patient.  IMPRESSION: 1.  Unsuccessful attempts and fluoroscopic guided placement of a feeding tube, as above.  Original Report Authenticated By: Florencia Reasons, M.D.    Anti-infectives: Anti-infectives     Start     Dose/Rate Route Frequency Ordered Stop   09/02/11 2200   Levofloxacin (LEVAQUIN) IVPB 750 mg  Status:  Discontinued        750 mg 100 mL/hr over 90 Minutes Intravenous Every 24 hours 09/02/11 0021 09/02/11 0158   09/02/11 1800   ceFEPIme (MAXIPIME) 1 g in dextrose 5 % 50 mL IVPB        1 g 100 mL/hr over 30 Minutes Intravenous Every 12 hours 09/02/11 1008     09/02/11 0200   ceFEPIme (MAXIPIME) 1 g in dextrose 5 % 50 mL IVPB  Status:  Discontinued        1 g 100 mL/hr over 30 Minutes Intravenous 3 times per day 09/02/11 0021 09/02/11 0156   09/02/11 0021   vancomycin (VANCOCIN) IVPB 1000 mg/200 mL premix  Status:  Discontinued        1,000 mg 200 mL/hr over 60 Minutes Intravenous Every 12 hours 09/02/11 0021 09/02/11 0159   08/30/11 0400   vancomycin (VANCOCIN) IVPB 1000 mg/200 mL premix        1,000 mg 200 mL/hr over 60 Minutes Intravenous Every 12 hours 08/29/11 2046 09/07/11 0359   08/29/11 2200   ceFEPIme (  MAXIPIME) 1 g in dextrose 5 % 50 mL IVPB  Status:  Discontinued        1 g 100 mL/hr over 30 Minutes Intravenous 3 times per day 08/29/11 2021 09/02/11 1008   08/29/11 2200   Levofloxacin (LEVAQUIN) IVPB 750 mg        750 mg 100 mL/hr over 90 Minutes Intravenous Every 24 hours 08/29/11 2021 09/06/11 2159   08/29/11 1600   piperacillin-tazobactam (ZOSYN) IVPB 3.375 g        3.375 g 12.5 mL/hr over 240 Minutes Intravenous  Once 08/29/11 1523 08/29/11 1942   08/29/11 1600   vancomycin (VANCOCIN) IVPB 1000 mg/200 mL premix        1,000 mg 200 mL/hr over 60 Minutes Intravenous  Once 08/29/11 1523 08/29/11 1727          Assessment/Plan: s/p Procedure(s) (LRB): ZENKER'S  DIVERTICULECTOMY (N/A) The patient is doing quite well after surgery with no infectious signs and much improved swallow.  He seems to be taking a nectar thick diet quite well.  I would suggest a reevaulation by speech pathology at this point to see if he can be cleared for thin liquids and normal diet.  If so, his diet can be advanced to regular.  Discharge when appropriate from pneumonia standpoint.  Follow-up with me in two weeks.  LOS: 5 days    Darius Castro 09/03/2011

## 2011-09-04 MED ORDER — LEVOFLOXACIN 750 MG PO TABS
750.0000 mg | ORAL_TABLET | Freq: Every day | ORAL | Status: DC
  Administered 2011-09-04: 750 mg via ORAL
  Filled 2011-09-04: qty 1

## 2011-09-04 MED ORDER — HYDROCODONE-ACETAMINOPHEN 5-325 MG PO TABS: 1.0000 | ORAL_TABLET | Freq: Four times a day (QID) | ORAL | Status: DC | PRN

## 2011-09-04 MED ORDER — LEVOFLOXACIN 750 MG PO TABS: 750.0000 mg | ORAL_TABLET | Freq: Every day | ORAL | Status: DC

## 2011-09-04 NOTE — Progress Notes (Signed)
Nutrition Follow-up  PEG placement unsuccessful. Pt transferred to [facility] for investigation and surgical repair of Zenker's diverticulum. Endoscopic Zenker's Diverticulotomy performed 3/13.  Clear liquids, thickened to nectar initiated 3/13. Per MD, pt to continue on current diet, monitoring for aspiration risk, deferring MBS. SLP has educated pt and RN about safe swallowing strategies. MBSS completed 3/14 recommending Dysphagia 1 with thin liquids.  Pt states he is tolerating foods well. Denies any questions or concerns at this time.  Diet Order:  Dysphagia 1 with thin liquids  Meds: Scheduled Meds:   . aspirin  81 mg Oral Once  . aspirin EC  81 mg Oral Daily  . atorvastatin  40 mg Oral Daily  . ceFEPime (MAXIPIME) IV  1 g Intravenous Q12H  . cholecalciferol  1,000 Units Oral Daily  . docusate  100 mg Oral BID  . enoxaparin  40 mg Subcutaneous Q24H  . levofloxacin (LEVAQUIN) IV  750 mg Intravenous Q24H  . lisinopril  40 mg Oral Daily  . loratadine  10 mg Oral Daily  . topiramate  25 mg Oral Daily  . vancomycin  1,000 mg Intravenous Q12H  . vitamin C  500 mg Oral Daily  . DISCONTD: docusate sodium  100 mg Oral BID   Continuous Infusions:  PRN Meds:.fentaNYL, food thickener, hydrALAZINE, HYDROmorphone, midazolam  Labs:  CMP     Component Value Date/Time   NA 143 09/03/2011 0535   K 3.2* 09/03/2011 0535   CL 109 09/03/2011 0535   CO2 29 09/03/2011 0535   GLUCOSE 150* 09/03/2011 0535   BUN 17 09/03/2011 0535   CREATININE 0.77 09/03/2011 0535   CALCIUM 7.7* 09/03/2011 0535   PROT 5.7* 08/29/2011 1300   ALBUMIN 2.5* 08/29/2011 1300   AST 56* 08/29/2011 1300   ALT 53 08/29/2011 1300   ALKPHOS 52 08/29/2011 1300   BILITOT 0.7 08/29/2011 1300   GFRNONAA 80* 09/03/2011 0535   GFRAA >90 09/03/2011 0535     Intake/Output Summary (Last 24 hours) at 09/04/11 0952 Last data filed at 09/04/11 0015  Gross per 24 hour  Intake    240 ml  Output    625 ml  Net   -385 ml    Weight Status:  70.3 kg, wt  stable  Estimated needs:  1850 - 2100 kcal, 85 - 100 grams protein  Nutrition Dx:  Inadequate oral intake r/t severe dysphagia r/t Zenker's diverticulum, awaiting ENT consult for diet/nutrition recommendations AEB NPO status. Improved.  Goal:  Pt to consume >/= 75% of meals. Met.  Intervention:  Pt declined any supplements at this time, stated he is leaving soon. Discussed use of oral nutrition supplements for weight maintenance.  Monitor:  PO intake, weights, labs, I/O's  Adair Laundry Pager #:  [phone]

## 2011-09-04 NOTE — Op Note (Signed)
NAME:  Darius Castro, Darius Castro                       ACCOUNT NO.:  MEDICAL RECORD NO.:  [id number]  LOCATION:                                 FACILITY:  PHYSICIAN:  Antony Contras, MD     DATE OF BIRTH:  12-18-1923  DATE OF PROCEDURE:  09/02/2011 DATE OF DISCHARGE:                              OPERATIVE REPORT   PREOPERATIVE DIAGNOSES:  Zenker's diverticulum and dysphagia.  POSTOPERATIVE DIAGNOSIS:  Zenker's diverticulum and dysphagia.  PROCEDURE:  Endoscopic Zenker's diverticulectomy.  SURGEON:  Antony Contras, MD.  ANESTHESIA:  General endotracheal anesthesia.  COMPLICATIONS:  None.  INDICATION:  The patient is an 78 year old male who underwent a hip replacement about a week and a half ago.  Prior to surgery, he had an occasional slight degree of dysphagia, but after surgery, dysphagia became quite severe and he has not been able to swallow anything significantly for the last 4 to 5 days.  He was also admitted for treatment of pneumonia that is being presumed to be aspiration related. Swallow evaluation revealed a sizable Zenker's diverticulum.  Attempts were made to pass the feeding tube in order to give nutrition while the pneumonia was being treated but these efforts failed.  Thus, he presents to the operating room for surgical management.  FINDINGS:  A significant Zenker's diverticulum was encountered and a common wall between the diverticulum and esophagus was divided.  There were no other abnormalities found.  DESCRIPTION OF PROCEDURE:  The patient was identified in the holding room and an informed consent having been obtained including discussion of risks, benefits, alternatives, the patient was brought to the operative suite and put on the operative table in supine position. Anesthesia was induced, and the patient was intubated by anesthesia team without difficulty.  The patient has been receiving intravenous antibiotics for management of his pneumonia.  The eyes taped  closed and bed was turned 90 degrees from anesthesia.  A tooth guard was placed over the upper teeth.  A Weerda bivalve scope was then placed into the pharynx and passed down into the esophagus isolating the common wall between the diverticulum and the esophagus.  The scope was placed in suspension on the patient's chest with a suspension arm.  A 30-degree telescope was then used to make a photograph.  The staple gun was then inserted down through the scope and then was discharged with one arm in the pouch and the other in the esophagus.  The staple gun was removed after dividing between the staple lines.  A 30-degree telescope was again used to evaluate the area.  Being that there was still a bit of the lip to the diverticulum, 4-0 Vicryl suture was then placed on either side of the staple line and used for gentle superior traction.  Another staple cartridge was used then with a staple gun to extend the incision and the staple line deeper into the pouch.  Finally, straight scissors were used to divide the very end of the pouch between the staple line.  A 30- degree telescope was used to make another photograph.  At this point, the Vicryl sutures were removed, and the  esophagus was suctioned.  The Weerda scope was taken out of suspension and gently removed from the patient's mouth.  The tooth guard was removed.  The patient was returned back to anesthesia for wake-up, was extubated, and moved to recovery room in stable condition.     Antony Contras, MD     DDB/MEDQ  D:  09/02/2011  T:  09/02/2011  Job:  [id number]

## 2011-09-04 NOTE — Discharge Summary (Signed)
Physician Discharge Summary  Patient ID: Darius Castro MRN: [id number] DOB/AGE: 1923-11-22 78 y.o.  Admit date: 08/29/2011 Discharge date: 09/04/2011  Primary Care Physician:  [name], MD, MD  Discharge Diagnoses:    .Healthcare-associated pneumonia . Marland KitchenDIABETES MELLITUS, TYPE II .Hip pain, right .Osteoarthritis .Zenker's hypopharyngeal diverticulum s/p diverticulectomy  .Dysphagia,  Consults:  ENT, Dr. Jenne Pane  Discharge Medications: Medication List  As of 09/04/2011 12:29 PM   STOP taking these medications         metroNIDAZOLE 500 MG tablet         TAKE these medications         acetaminophen 325 MG tablet   Commonly known as: TYLENOL   Take 650 mg by mouth every 4 (four) hours as needed. For pain or temp > 100.3      ammonium lactate 12 % lotion   Commonly known as: LAC-HYDRIN   Apply 1 application topically daily.      aspirin 81 MG EC tablet   Take 1 tablet (81 mg total) by mouth daily.      atorvastatin 40 MG tablet   Commonly known as: LIPITOR   Take 40 mg by mouth daily.      BOOST GLUCOSE CONTROL PO   Take 1 Can by mouth 5 (five) times daily. 8am, 10am, 2pm, 4pm, and 8pm      cetirizine 10 MG tablet   Commonly known as: ZYRTEC   Take 10 mg by mouth daily.      cholecalciferol 1000 UNITS tablet   Commonly known as: VITAMIN D   Take 1,000 Units by mouth daily.      DSS 100 MG Caps   Take 100 mg by mouth 2 (two) times daily.      enoxaparin 40 MG/0.4ML injection   Commonly known as: LOVENOX   Inject 0.4 mLs (40 mg total) into the skin at bedtime.      food thickener Powd   Commonly known as: RESOURCE THICKENUP CLEAR   Use per label to make full liquid diet nectar thick      HYDROcodone-acetaminophen 5-325 MG per tablet   Commonly known as: NORCO   Take 1 tablet by mouth every 6 (six) hours as needed for pain.      insulin aspart 100 UNIT/ML injection   Commonly known as: novoLOG   Inject 0-12 Units into the skin 3 (three) times daily  before meals. Sliding scale: 0-200 = 0u; 201-250 = 2u; 251-300 = 4u; 301-350 = 6u; 351-400 = 8u; 401-450 = 10u; >450 = 12u and call MD.      levofloxacin 750 MG tablet   Commonly known as: LEVAQUIN   Take 1 tablet (750 mg total) by mouth daily.      lisinopril 40 MG tablet   Commonly known as: PRINIVIL,ZESTRIL   Take 40 mg by mouth daily.      phenol 1.4 % Liqd   Commonly known as: CHLORASEPTIC   Use as directed 1 spray in the mouth or throat as needed.      potassium chloride SA 20 MEQ tablet   Commonly known as: K-DUR,KLOR-CON   Take 40 mEq by mouth daily.      topiramate 25 MG tablet   Commonly known as: TOPAMAX   Take 25 mg by mouth daily.      vitamin C 500 MG tablet   Commonly known as: ASCORBIC ACID   Take 500 mg by mouth daily.  Brief H and P: For complete details please refer to admission H and P, but in brief, 78 year old male with history of diabetes, osteoarthritis presented with febrile illness including fever, cough, shortness of breath and vomiting. Chest x-ray showed developing multilobar pneumonia and patient was admitted for further workup.  Hospital Course:  The patient is 78 year old male who underwent hip replacement surgery a week ago prior to admission. Prior to the surgery patient had noted some difficulty swallowing big pills about a year ago and then had converted to smaller pills. Sometime after the surgery, patient of difficulty swallowing. Chest x-ray obtained at the time of admission showed multilobar pneumonia and patient was admitted for IV antibiotics. During the hospitalization patient was noted to have dysphagia and he was made n.p.o. as he still had difficulty with swallowing despite changing to liquid diet. Patient had esophagogram done on 08/26/2011 which showed large Zenker's diverticulum. ENT was consulted and patient was graciously seen by Dr. Christia Reading. Patient was then transferred to [facility] and he underwent  endoscopic Zenker's diverticulectomy on 09/02/2011. Patient was subsequently started on pured diet per speech therapist after the swallow study. The pneumonia has been improving and patient was initially started on 3 antibiotics broad-spectrum. Patient will be discharged on by mouth Levaquin for 5 days to complete a course for 10 days. Patient had another swallow evaluation today and the diet was upgraded to mechanical soft diet with instructions from the speech therapist. Patient will be discharged to skilled nursing facility today.  Day of Discharge BP 150/69  Pulse 77  Temp(Src) 97.6 F (36.4 C) (Oral)  Resp 21  Ht 5\' 8"  (1.727 m)  Wt 70.3 kg (154 lb 15.7 oz)  BMI 23.57 kg/m2  SpO2 90%  Physical Exam: General: Alert and awake oriented x3 not in any acute distress. HEENT: anicteric sclera, pupils reactive to light and accommodation CVS: S1-S2 clear no murmur rubs or gallops Chest: clear to auscultation bilaterally, no wheezing rales or rhonchi Abdomen: soft nontender, nondistended, normal bowel sounds, no organomegaly Extremities: no cyanosis, clubbing or edema noted bilaterally Neuro: Cranial nerves II-XII intact, no focal neurological deficits   The results of significant diagnostics from this hospitalization (including imaging, microbiology, ancillary and laboratory) are listed below for reference.    LAB RESULTS: Basic Metabolic Panel:  Lab 09/03/11 1610 09/01/11 0525  NA 143 144  K 3.2* 3.0*  CL 109 108  CO2 29 28  GLUCOSE 150* 104*  BUN 17 21  CREATININE 0.77 0.84  CALCIUM 7.7* 8.1*  MG 1.7 --  PHOS -- --   Liver Function Tests:  Lab 08/29/11 1300  AST 56*  ALT 53  ALKPHOS 52  BILITOT 0.7  PROT 5.7*  ALBUMIN 2.5*   CBC:  Lab 09/01/11 0525 08/30/11 0500  WBC 10.1 8.2  NEUTROABS -- 6.8  HGB 8.3* 8.7*  HCT 25.0* 26.1*  MCV 90.6 --  PLT 322 274   CBG:  Lab 09/02/11 0940  GLUCAP 88    Significant Diagnostic Studies:  Dg Chest 2 View  08/29/2011   *RADIOLOGY REPORT*  Clinical Data: History of cough and fever.  CHEST - 2 VIEW  Comparison: Multiple priors, most recently 08/26/2011.  Findings: Compared to the recent radiograph from 08/26/2011, there is now an area of ill-defined interstitial prominence and patchy air space disease in the left midlung, and potentially the left lower lobe (retrocardiac region), as well as the medial aspect of the right lower lobe.  Findings are concerning for  multilobar pneumonia.  Small bilateral pleural effusions are noted on the lateral projection.  Pulmonary vasculature is normal.  Heart size is within normal limits.  Mediastinal contours are unremarkable. Atherosclerosis of the thoracic aorta.  Marked gaseous distension of the stomach is again noted.  IMPRESSION: 1.  Interval development of patchy interstitial and airspace disease in the left mid lung and lower lobes of the lungs bilaterally, with small bilateral pleural effusions.  Findings are concerning for developing multilobar pneumonia. 2.  Atherosclerosis. 3.  Persistent gaseous distension of the stomach.  Original Report Authenticated By: Florencia Reasons, M.D.     Disposition and Follow-up: Discharge Orders    Future Orders Please Complete By Expires   Increase activity slowly      Discharge instructions      Comments:   DISCHARGE DIET: MOIST mechanical soft only (extra sauce/gravy). Moisten bread/biscuits. Head turn to the left when swallowing solids. Follow solid with liquids. Swallow multiple times. No straws. Crush meds in puree.       DISPOSITION: Skilled nursing facility  DIET: Please see discharge diet instructions  ACTIVITY: Continue physical therapy  DISCHARGE FOLLOW-UP Follow-up Information    Follow up with [name], MD. Schedule an appointment as soon as possible for a visit in 10 days. (FOR HOSPITAL FOLLOW UP)       Follow up with BATES, DWIGHT, MD. Schedule an appointment as soon as possible for a visit in 2 weeks.  (POST OP FOLLOW UP)    Contact information:   [facility] [address], [address] [place] [address] [phone]          Time spent on Discharge: 45 minutes  Signed:  Lathen Seal M.D. [facility] 09/04/2011, 12:29 PM

## 2011-09-04 NOTE — Progress Notes (Signed)
Clinical Social Work Department BRIEF PSYCHOSOCIAL ASSESSMENT 09/04/2011  Patient:  Darius Castro, Darius Castro     Account Number:  [id number]     Admit date:  08/29/2011  Clinical Social Worker:  Juliette Mangle  Date/Time:  09/04/2011 10:30 AM  Referred by:  Physician  Date Referred:  09/04/2011 Referred for  SNF Placement   Other Referral:   Interview type:  Patient Other interview type:    PSYCHOSOCIAL DATA Living Status:  FACILITY Admitted from facility:   Level of care:  Skilled Nursing Facility Primary support name:  R[place] Primary support relationship to patient:  SPOUSE Degree of support available:   Very Goood- Patient lives in Independent Living at [facility]    CURRENT CONCERNS Current Concerns  Post-Acute Placement   Other Concerns:    SOCIAL WORK ASSESSMENT / PLAN CSW met patient at bedside to offer support and also discuss returning to [facility] for Skilled Nursing. Pt was in a pleasant mood and was agreeable to returning to the facility. Pt. reported being excited about his d/c from the hospital. Pt seemed eager to return "home". CSW will assist with d/c needs back to [facility].   Assessment/plan status:  Psychosocial Support/Ongoing Assessment of Needs Other assessment/ plan:   Information/referral to community resources:    PATIENT'S/FAMILY'S RESPONSE TO PLAN OF CARE: Pt was appreciative of support and information provided by CSW. CSW will continue to follow and assist with all d/c needs.    Sabino Niemann, MSW, [employer] [phone]

## 2011-09-04 NOTE — Progress Notes (Signed)
Clinical social worker assisted with patient discharge to skilled nursing facility, [facility].  CSW addressed all patient questions and concerns. CSW copied chart and added all important documents. CSW also set up patient transportation with [employer]. Clinical Social Worker will sign off for now as social work intervention is no longer needed.   Sabino Niemann, MSW, [employer] [phone]

## 2011-09-04 NOTE — Progress Notes (Signed)
Physical Therapy Treatment Patient Details Name: Darius Castro MRN: [id number] DOB: Nov 12, 1923 Today's Date: 09/04/2011  PT Assessment/Plan  PT - Assessment/Plan Comments on Treatment Session: Limited treatment due to patient eager for transport back to [facility].  PT Plan: Discharge plan remains appropriate PT Frequency: Min 5X/week Follow Up Recommendations: Skilled nursing facility Equipment Recommended: Defer to next venue PT Goals  Acute Rehab PT Goals Pt will go Sit to Supine/Side: with modified independence PT Goal: Sit to Supine/Side - Progress: Progressing toward goal Pt will go Sit to Stand: with modified independence PT Goal: Sit to Stand - Progress: Progressing toward goal  PT Treatment Precautions/Restrictions  Precautions Precautions: Posterior Hip Required Braces or Orthoses: No Restrictions Weight Bearing Restrictions: Yes RLE Weight Bearing: Partial weight bearing RLE Partial Weight Bearing Percentage or Pounds: 50% Mobility (including Balance) Bed Mobility Supine to Sit: 3: Mod assist;With rails Sitting - Scoot to Edge of Bed: 3: Mod assist Sit to Supine: 3: Mod assist;HOB flat Sit to Supine - Details (indicate cue type and reason): increased time and assist for right LE and to lower head towards HOB Scooting to HOB: 3: Mod assist;With rail Scooting to [facility] Details (indicate cue type and reason): cues for technique and assist  Transfers Sit to Stand: 4: Min assist;From chair/3-in-1;With upper extremity assist Sit to Stand Details (indicate cue type and reason): cues for right leg out for hip precautions Stand to Sit: 4: Min assist;With upper extremity assist;To bed Stand to Sit Details: cues for reaching back to bed Ambulation/Gait Ambulation/Gait: No (fatigued and wanting to save strength for transport to [facility])    Exercise    End of Session PT - End of Session Equipment Utilized During Treatment: Gait belt Activity Tolerance: Patient limited by  fatigue Patient left: in bed;with call bell in reach General Behavior During Session: [facility] for tasks performed Cognition: [facility] for tasks performed  [facility] 09/04/2011, 12:42 PM

## 2011-09-04 NOTE — Progress Notes (Signed)
Occupational Therapy Treatment Patient Details Name: Darius Castro MRN: [id number] DOB: [birth year] Today's Date: 09/04/2011  OT Assessment/Plan OT Assessment/Plan OT Plan: Discharge plan remains appropriate OT Goals ADL Goals ADL Goal: Grooming - Progress: Progressing toward goals ADL Goal: Toilet Transfer - Progress: Progressing toward goals  OT Treatment Precautions/Restrictions  Restrictions Weight Bearing Restrictions: Yes RLE Weight Bearing: Partial weight bearing RLE Partial Weight Bearing Percentage or Pounds: 50%   ADL ADL Grooming: Performed;Minimal assistance;Wash/dry face Where Assessed - Grooming: Standing at sink;Sitting, bed;Unsupported Toilet Transfer: Performed;Minimal Dentist Details (indicate cue type and reason): performed bed to chair transfer Toilet Transfer Method: Stand pivot Ambulation Related to ADLs: Pt doing well. Agreed to get to chair.  [facility] Mob[facility]  Bed Mobility Supine to Sit: 3: Mod assist;With rails Sitting - Scoot to Edge of Bed: 3: Mod assist Transfers Transfers: Yes Sit to Stand: 4: Min assist;With upper extremity assist Stand to Sit: 4: Min assist;With upper extremity assist Stand to Sit Details: pt "plopped with stand to sit" educated on importance of lowering down slowing and using arms to reach back     End of Session OT - End of Session Activity Tolerance: Patient tolerated treatment well Patient left: in chair;with call bell in reach General Behavior During Session: [facility] for tasks performed Cognition: [facility] for tasks performed  [place], [place]ung  09/04/2011, 10:32 AM

## 2011-09-04 NOTE — Progress Notes (Signed)
Speech Language/Pathology Speech Pathology: Dysphagia Treatment Note  Patient was observed with : Pureed and Thin liquids.  Patient was noted to have s/s of aspiration : Yes:  Soft cough x2  Lung Sounds:  Clear  Temperature: afebrile  Patient required: Moderate verbal cues for head turn to left, multiple swallows, following solids with liquids, clearing penetrate when wet vocal quality observed.  Extensive verbal and written education provided to pt.  Clinical Impression: Pt is tolerating thin liquids/pureed solids likely with mild residue that pt is learning to clear. Pt also learning to utilize strategies for swallowing soft solids. Will recommend upgrade of diet prior to d/c. Pt will need SLP therapy at SNF to reinforce strategies.   Recommendations:  Dys 3 (mechanical soft diet) with MOIST solids only (extra gravy/sauce), moisten breads with liquid. Turn head to left when swallowing solids. Swallow multiple times. Follow solids with liquids, crush meds. No straws.   Pain:   none Intervention Required:   No   Goals: Goals Partially Met Harlon Ditty, MA CCC-SLP [phone]

## 2011-09-07 ENCOUNTER — Encounter (HOSPITAL_COMMUNITY): Payer: Self-pay | Admitting: Otolaryngology

## 2011-09-08 ENCOUNTER — Encounter (HOSPITAL_COMMUNITY): Payer: Self-pay | Admitting: Emergency Medicine

## 2011-09-08 ENCOUNTER — Inpatient Hospital Stay (HOSPITAL_COMMUNITY)
Admission: EM | Admit: 2011-09-08 | Discharge: 2011-09-09 | DRG: 919 | Disposition: A | Discharge status: Skilled Nursing Facility | Payer: Medicare Other | Source: Ambulatory Visit | Attending: Internal Medicine | Admitting: Internal Medicine

## 2011-09-08 DIAGNOSIS — Z9849 Cataract extraction status, unspecified eye: Secondary | ICD-10-CM

## 2011-09-08 DIAGNOSIS — Z8546 Personal history of malignant neoplasm of prostate: Secondary | ICD-10-CM

## 2011-09-08 DIAGNOSIS — G4730 Sleep apnea, unspecified: Secondary | ICD-10-CM | POA: Diagnosis present

## 2011-09-08 DIAGNOSIS — K225 Diverticulum of esophagus, acquired: Secondary | ICD-10-CM | POA: Diagnosis present

## 2011-09-08 DIAGNOSIS — R55 Syncope and collapse: Secondary | ICD-10-CM

## 2011-09-08 DIAGNOSIS — D649 Anemia, unspecified: Secondary | ICD-10-CM | POA: Diagnosis present

## 2011-09-08 DIAGNOSIS — E119 Type 2 diabetes mellitus without complications: Secondary | ICD-10-CM | POA: Diagnosis present

## 2011-09-08 DIAGNOSIS — E860 Dehydration: Secondary | ICD-10-CM | POA: Diagnosis present

## 2011-09-08 DIAGNOSIS — J189 Pneumonia, unspecified organism: Secondary | ICD-10-CM

## 2011-09-08 DIAGNOSIS — Y831 Surgical operation with implant of artificial internal device as the cause of abnormal reaction of the patient, or of later complication, without mention of misadventure at the time of the procedure: Secondary | ICD-10-CM | POA: Diagnosis present

## 2011-09-08 DIAGNOSIS — M1990 Unspecified osteoarthritis, unspecified site: Secondary | ICD-10-CM

## 2011-09-08 DIAGNOSIS — R1310 Dysphagia, unspecified: Secondary | ICD-10-CM | POA: Diagnosis present

## 2011-09-08 DIAGNOSIS — J690 Pneumonitis due to inhalation of food and vomit: Secondary | ICD-10-CM | POA: Diagnosis present

## 2011-09-08 DIAGNOSIS — M129 Arthropathy, unspecified: Secondary | ICD-10-CM | POA: Diagnosis present

## 2011-09-08 DIAGNOSIS — R509 Fever, unspecified: Secondary | ICD-10-CM

## 2011-09-08 DIAGNOSIS — M25551 Pain in right hip: Secondary | ICD-10-CM

## 2011-09-08 DIAGNOSIS — I10 Essential (primary) hypertension: Secondary | ICD-10-CM | POA: Diagnosis present

## 2011-09-08 DIAGNOSIS — K5900 Constipation, unspecified: Secondary | ICD-10-CM | POA: Diagnosis present

## 2011-09-08 DIAGNOSIS — K922 Gastrointestinal hemorrhage, unspecified: Secondary | ICD-10-CM

## 2011-09-08 DIAGNOSIS — Z87891 Personal history of nicotine dependence: Secondary | ICD-10-CM

## 2011-09-08 DIAGNOSIS — D500 Iron deficiency anemia secondary to blood loss (chronic): Secondary | ICD-10-CM | POA: Diagnosis present

## 2011-09-08 DIAGNOSIS — R195 Other fecal abnormalities: Secondary | ICD-10-CM | POA: Diagnosis present

## 2011-09-08 DIAGNOSIS — Z860100 Personal history of colon polyps, unspecified: Secondary | ICD-10-CM

## 2011-09-08 DIAGNOSIS — Z7982 Long term (current) use of aspirin: Secondary | ICD-10-CM

## 2011-09-08 DIAGNOSIS — E785 Hyperlipidemia, unspecified: Secondary | ICD-10-CM

## 2011-09-08 DIAGNOSIS — I959 Hypotension, unspecified: Secondary | ICD-10-CM | POA: Diagnosis present

## 2011-09-08 DIAGNOSIS — IMO0002 Reserved for concepts with insufficient information to code with codable children: Principal | ICD-10-CM | POA: Diagnosis present

## 2011-09-08 DIAGNOSIS — I440 Atrioventricular block, first degree: Secondary | ICD-10-CM | POA: Diagnosis present

## 2011-09-08 DIAGNOSIS — Z96649 Presence of unspecified artificial hip joint: Secondary | ICD-10-CM

## 2011-09-08 LAB — DIFFERENTIAL
Basophils Absolute: 0 10*3/uL (ref 0.0–0.1)
Eosinophils Relative: 0 % (ref 0–5)
Lymphocytes Relative: 8 % — ABNORMAL LOW (ref 12–46)
Neutro Abs: 10.4 10*3/uL — ABNORMAL HIGH (ref 1.7–7.7)

## 2011-09-08 LAB — CBC
Platelets: 439 10*3/uL — ABNORMAL HIGH (ref 150–400)
RDW: 13.9 % (ref 11.5–15.5)
WBC: 12 10*3/uL — ABNORMAL HIGH (ref 4.0–10.5)

## 2011-09-08 LAB — BASIC METABOLIC PANEL
CO2: 24 mEq/L (ref 19–32)
Calcium: 8.3 mg/dL — ABNORMAL LOW (ref 8.4–10.5)
Chloride: 103 mEq/L (ref 96–112)
GFR calc Af Amer: 64 mL/min — ABNORMAL LOW (ref >90)
Sodium: 137 mEq/L (ref 135–145)

## 2011-09-08 LAB — PREPARE RBC (CROSSMATCH)

## 2011-09-08 MED ORDER — ACETAMINOPHEN 325 MG PO TABS: 650.0000 mg | ORAL_TABLET | ORAL | Status: DC | PRN

## 2011-09-08 MED ORDER — VITAMIN D3 25 MCG (1000 UNIT) PO TABS
1000.0000 [IU] | ORAL_TABLET | Freq: Every day | ORAL | Status: DC
  Administered 2011-09-09: 1000 [IU] via ORAL
  Filled 2011-09-08: qty 1

## 2011-09-08 MED ORDER — ENOXAPARIN SODIUM 40 MG/0.4ML ~~LOC~~ SOLN
40.0000 mg | Freq: Every day | SUBCUTANEOUS | Status: DC
  Administered 2011-09-08: 40 mg via SUBCUTANEOUS
  Filled 2011-09-08 (×2): qty 0.4

## 2011-09-08 MED ORDER — ATORVASTATIN CALCIUM 40 MG PO TABS
40.0000 mg | ORAL_TABLET | Freq: Every day | ORAL | Status: DC
  Administered 2011-09-09: 40 mg via ORAL
  Filled 2011-09-08: qty 1

## 2011-09-08 MED ORDER — SODIUM CHLORIDE 0.9 % IV BOLUS (SEPSIS)
500.0000 mL | Freq: Once | INTRAVENOUS | Status: AC
  Administered 2011-09-08: 500 mL via INTRAVENOUS

## 2011-09-08 MED ORDER — FERROUS SULFATE 325 (65 FE) MG PO TABS
325.0000 mg | ORAL_TABLET | Freq: Two times a day (BID) | ORAL | Status: DC
  Administered 2011-09-09: 325 mg via ORAL
  Filled 2011-09-08 (×4): qty 1

## 2011-09-08 MED ORDER — DOCUSATE SODIUM 100 MG PO CAPS
100.0000 mg | ORAL_CAPSULE | Freq: Two times a day (BID) | ORAL | Status: DC
  Administered 2011-09-08 – 2011-09-09 (×2): 100 mg via ORAL
  Filled 2011-09-08 (×3): qty 1

## 2011-09-08 MED ORDER — MAGNESIUM HYDROXIDE 400 MG/5ML PO SUSP: 30.0000 mL | Freq: Every day | ORAL | Status: DC | PRN

## 2011-09-08 MED ORDER — ONDANSETRON HCL 4 MG/2ML IJ SOLN: 4.0000 mg | Freq: Four times a day (QID) | INTRAMUSCULAR | Status: DC | PRN

## 2011-09-08 MED ORDER — TOPIRAMATE 25 MG PO TABS
25.0000 mg | ORAL_TABLET | Freq: Every day | ORAL | Status: DC
  Administered 2011-09-08 – 2011-09-09 (×2): 25 mg via ORAL
  Filled 2011-09-08 (×2): qty 1

## 2011-09-08 MED ORDER — PANTOPRAZOLE SODIUM 40 MG IV SOLR
40.0000 mg | Freq: Once | INTRAVENOUS | Status: AC
  Administered 2011-09-08: 40 mg via INTRAVENOUS
  Filled 2011-09-08: qty 40

## 2011-09-08 MED ORDER — INSULIN ASPART 100 UNIT/ML ~~LOC~~ SOLN: 0.0000 [IU] | Freq: Three times a day (TID) | SUBCUTANEOUS | Status: DC

## 2011-09-08 MED ORDER — INSULIN ASPART 100 UNIT/ML ~~LOC~~ SOLN: 0.0000 [IU] | Freq: Every day | SUBCUTANEOUS | Status: DC

## 2011-09-08 MED ORDER — FLEET ENEMA 7-19 GM/118ML RE ENEM
1.0000 | ENEMA | Freq: Once | RECTAL | Status: AC
  Administered 2011-09-09: 1 via RECTAL
  Filled 2011-09-08 (×2): qty 1

## 2011-09-08 MED ORDER — VITAMIN C 500 MG PO TABS
500.0000 mg | ORAL_TABLET | Freq: Every day | ORAL | Status: DC
  Administered 2011-09-09: 500 mg via ORAL
  Filled 2011-09-08: qty 1

## 2011-09-08 MED ORDER — LORATADINE 10 MG PO TABS
10.0000 mg | ORAL_TABLET | Freq: Every day | ORAL | Status: DC
  Administered 2011-09-09: 10 mg via ORAL
  Filled 2011-09-08: qty 1

## 2011-09-08 MED ORDER — SODIUM CHLORIDE 0.9 % IV SOLN
INTRAVENOUS | Status: AC
  Administered 2011-09-08: 17:00:00 via INTRAVENOUS

## 2011-09-08 MED ORDER — PHENOL 1.4 % MT LIQD
1.0000 | OROMUCOSAL | Status: DC | PRN
  Filled 2011-09-08: qty 177

## 2011-09-08 MED ORDER — LEVOFLOXACIN 750 MG PO TABS
750.0000 mg | ORAL_TABLET | ORAL | Status: DC
  Administered 2011-09-08: 750 mg via ORAL
  Filled 2011-09-08 (×2): qty 1

## 2011-09-08 MED ORDER — ONDANSETRON HCL 4 MG PO TABS: 4.0000 mg | ORAL_TABLET | Freq: Four times a day (QID) | ORAL | Status: DC | PRN

## 2011-09-08 MED ORDER — POTASSIUM CHLORIDE CRYS ER 20 MEQ PO TBCR
40.0000 meq | EXTENDED_RELEASE_TABLET | Freq: Every day | ORAL | Status: DC
  Administered 2011-09-09: 40 meq via ORAL
  Filled 2011-09-08: qty 2

## 2011-09-08 MED ORDER — ASPIRIN EC 81 MG PO TBEC
81.0000 mg | DELAYED_RELEASE_TABLET | Freq: Every day | ORAL | Status: DC
  Administered 2011-09-09: 81 mg via ORAL
  Filled 2011-09-08: qty 1

## 2011-09-08 MED ORDER — POLYETHYLENE GLYCOL 3350 17 G PO PACK
17.0000 g | PACK | Freq: Every day | ORAL | Status: DC
  Administered 2011-09-09: 17 g via ORAL
  Filled 2011-09-08: qty 1

## 2011-09-08 MED ORDER — HYDROCODONE-ACETAMINOPHEN 5-325 MG PO TABS: 1.0000 | ORAL_TABLET | Freq: Four times a day (QID) | ORAL | Status: DC | PRN

## 2011-09-08 NOTE — ED Notes (Signed)
Patient moved to yellow, [facility] RN aware.

## 2011-09-08 NOTE — ED Notes (Signed)
Had quaic positive stool when he went to [facility] this am Dr Rito Ehrlich to admit

## 2011-09-08 NOTE — ED Provider Notes (Signed)
History     CSN: [id number]  Arrival date & time 09/08/11  1218   None     Chief Complaint  Patient presents with  . GI Problem    (Consider location/radiation/quality/duration/timing/severity/associated sxs/prior treatment) Patient is a 78 y.o. male presenting with GI illlness. The history is provided by the patient and medical records.  GI Problem  Pertinent negatives include no abdominal pain and no vomiting.   the patient is an 78 year old, male, who presents to emergency department for evaluation of anemia and GI bleed.  He was at the scene.  Followup ointment for suture removal after he had a right hip replacement.  A few weeks ago.  Blood work revealed that his hemoglobin had dropped so a rectal examination was performed.  He was heme positive for weakness in the emergency department for further evaluation.  He denies lightheadedness, or shortness of breath.  He does not have pain.  History of peptic ulcer disease.  He is not taking any anticoagulants except aspirin.  He denies alcohol use.  He denies any symptoms at this time.  Past Medical History  Diagnosis Date  . Diabetes mellitus   . Hyperlipidemia     takes Lipitor daily  . Adenomatous colon polyp 3/09  . Personal history of prostate cancer   . Dysphagia     with pills  . Hypertension     takes Fosinopril daily  . Sleep apnea     sleep study done in 03/30/04 in epic;doesn't use a CPAP  . Pneumonia     hx of as a child  . Arthritis     back and hip  . Dizziness     pt states when he is hot and stands up too fast  . Back pain     hx of ruptured disc  . Dry skin   . Hemorrhoid     internal   . Hx of colonic polyps   . Nocturia   . Cancer     prostate   . Impaired hearing   . 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 Lumbar laminectomy 1998  . Knee arthroscopy 2001  . Rotator cuff repair 2/10    right  . Cataract extraction     bilateral  .  Tonsillectomy     as a child  . Colonoscopy   . Radioactive seed implant 2008  . Total hip arthroplasty 08/24/2011    Procedure: TOTAL HIP ARTHROPLASTY;  Surgeon: Nilda Simmer, MD;  Location: [facility] OR;  Service: Orthopedics;  Laterality: Right;  DR Thurston Hole WANTS 90 MINUTES FOR SURGERY  . Zenker's diverticulectomy 09/02/2011    Procedure: ZENKER'S DIVERTICULECTOMY;  Surgeon: Christia Reading, MD;  Location[facility];  Service: ENT;  Laterality: N/A;    Family History  Problem Relation Age of Onset  . Heart disease Father   . Heart attack Father   . Colon polyps Daughter   . Colon cancer Neg Hx   . Anesthesia problems Neg Hx   . Hypotension Neg Hx   . Malignant hyperthermia Neg Hx   . Pseudochol deficiency Neg Hx   . Hypertension Mother     History  Substance Use Topics  . Smoking status: Former Smoker    Quit date: 06/22/1961  . Smokeless tobacco: Not on file  . Alcohol Use: Yes     red wine 3-4 times/wk      Review of Systems  Constitutional: Negative for fatigue.  HENT: Negative for nosebleeds.   Respiratory: Negative for shortness of breath.   Cardiovascular: Negative for chest pain.  Gastrointestinal: Positive for blood in stool. Negative for nausea, vomiting, abdominal pain and diarrhea.       Blood was noted in his stool.  On physical examination.  He does not report, blood himself  Skin: Positive for pallor.  Neurological: Negative for dizziness and light-headedness.  Hematological: Does not bruise/bleed easily.  All other systems reviewed and are negative.    Allergies  Review of patient's allergies indicates no known allergies.  Home Medications   Current Outpatient Rx  Name Route Sig Dispense Refill  . ACETAMINOPHEN 325 MG PO TABS Oral Take 650 mg by mouth every 4 (four) hours as needed. For pain or temp > 100.3    . VITAMIN C 500 MG PO TABS Oral Take 500 mg by mouth daily.      . ASPIRIN 81 MG PO TBEC Oral Take 1 tablet (81 mg total) by mouth daily.    .  ATORVASTATIN CALCIUM 40 MG PO TABS Oral Take 40 mg by mouth daily.    Marland Kitchen CETIRIZINE HCL 10 MG PO TABS Oral Take 10 mg by mouth daily.      Marland Kitchen VITAMIN D 1000 UNITS PO TABS Oral Take 1,000 Units by mouth daily.      Marland Kitchen DOCUSATE SODIUM 100 MG PO CAPS Oral Take 100 mg by mouth 2 (two) times daily.    Marland Kitchen ENOXAPARIN SODIUM 40 MG/0.4ML [place] SOLN Subcutaneous Inject 0.4 mLs (40 mg total) into the skin at bedtime. 30 Syringe 0  . FERROUS SULFATE 325 (65 FE) MG PO TABS Oral Take 325 mg by mouth 2 (two) times daily.    Marland Kitchen HYDROCODONE-ACETAMINOPHEN 5-325 MG PO TABS Oral Take 1 tablet by mouth every 6 (six) hours as needed for pain. 30 tablet 0  . INSULIN ASPART 100 UNIT/ML [place] SOLN Subcutaneous Inject 0-12 Units into the skin 3 (three) times daily before meals. Sliding scale: 0-200 = 0u; 201-250 = 2u; 251-300 = 4u; 301-350 = 6u; 351-400 = 8u; 401-450 = 10u; >450 = 12u and call MD.    . LEVOFLOXACIN 750 MG PO TABS Oral Take 1 tablet (750 mg total) by mouth daily. 5 tablet 0  . LISINOPRIL 40 MG PO TABS Oral Take 40 mg by mouth daily.    Marland Kitchen MAGNESIUM HYDROXIDE 400 MG/5ML PO SUSP Oral Take 30 mLs by mouth daily as needed. For constipation    . PHENOL 1.4 % MT LIQD Mouth/Throat Use as directed 1 spray in the mouth or throat as needed. For sore throat    . POTASSIUM CHLORIDE CRYS ER 20 MEQ PO TBCR Oral Take 40 mEq by mouth daily.    . TOPIRAMATE 25 MG PO TABS Oral Take 25 mg by mouth daily.      BP 98/55  Pulse 79  Temp(Src) 97.3 F (36.3 C) (Oral)  Resp 19  SpO2 97%  Physical Exam  Vitals reviewed. Constitutional: He is oriented to person, place, and time. He appears well-developed and well-nourished.  HENT:  Head: Normocephalic and atraumatic.  Eyes: Pupils are equal, round, and reactive to light.       Conjunctiva pale  Neck: Normal range of motion. Neck supple.  Cardiovascular: Normal rate.   No murmur heard. Pulmonary/Chest: Effort normal. No respiratory distress.  Abdominal: Soft. He exhibits no mass.  There is no tenderness. There is no rebound and no guarding.  Musculoskeletal: Normal range of motion.  Neurological: He is alert and oriented to person, place, and time.  Skin: Skin is warm and dry. There is pallor.  Psychiatric: He has a normal mood and affect. Thought content normal.    ED Course  Procedures (including critical care time) 78 year old, male, with anemia and GI bleed.  Rectal examination performed prior to arrival by another physician.  He has mild hypotension without tachycardia.  We will establish an IV perform laboratory testing, and admit for further diagnostic testing.   Labs Reviewed  CBC  DIFFERENTIAL  BASIC METABOLIC PANEL   No results found.   No diagnosis found.  4:59 PM Spoke with triad md.  He will admit for further eval and tx  MDM  Anemia GI bleed        Cheri Guppy, MD 09/08/11 1659

## 2011-09-08 NOTE — ED Notes (Signed)
Attempt to call report x1. Secretary states that she will give RN the phone number to return call.

## 2011-09-08 NOTE — ED Notes (Signed)
Patient presents from PCP office for further evaluation of possible GI bleed. Patient reporting weakness for several days and states " I just haven't been myself"  Skin pale, warm, dry and intact; abdomen soft, non distended, non tender upon palpation, resting quietly, denies pain.

## 2011-09-08 NOTE — H&P (Signed)
PCP:   [name], MD, MD   Chief Complaint:  Hypotension  HPI: Pt is an 78 yr old wm recently discharged from the hospital 2 weeks ago for a hip replacement who returned back for aspiration pneumonia secondary to a Zenker's diverticulum which was able to be repaired by ENT & then a subsequent d/c back to [facility] for rehab for his hip.  He was seen in the ENT office for follow-up & noted to be hypotensive.  Labs were checked & he was found to have a Hgb of 7.9.  In review of his recent hospital blood work, he had started (prior to his hip repair) to have an Hgb of 13.2.  This was most likely felt to have been from blood loss from the original hip surgery and not an acute bleed.  ENT discussed this with the patient's PCP's partner, [name], who made arrangements to have the patient admitted overnight for a blood transfusion (short stay full).  THere were reports from the SNF that the patient did have heme-positive stools, so he was evaluated in the ER to ensure he was not having any signs of a GI Bleed.  In the ER, vital sign did confirm an initial systolic BP of 89, yet his HR was normal.  He looked fatigued, but not severely volume depleted.  Blood was started & hospitalists were called for admit.  Review of Systems:  When I saw the patient in the emergency room, he was just starting to receive his blood.  He denied any HA's, vision changes, chest pain, palpitations, SOB, wheezing, cough, abd pain, hematuria, diarrhea, focal ext weakness, numbness or pain.  He does report constipation.  Past Medical History: Past Medical History  Diagnosis Date  . Diabetes mellitus   . Hyperlipidemia     takes Lipitor daily  . Adenomatous colon polyp 3/09  . Personal history of prostate cancer   . Dysphagia     with pills  . Hypertension     takes Fosinopril daily  . Sleep apnea     sleep study done in 03/30/04 in epic;doesn't use a CPAP  . Pneumonia     hx of as a child  . Arthritis     back and hip  . Dizziness     pt states when he is hot and stands up too fast  . Back pain     hx of ruptured disc  . Dry skin   . Hemorrhoid     internal   . Hx of colonic polyps   . Nocturia   . Cancer     prostate   . Impaired hearing   . 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 Lumbar laminectomy 1998  . Knee arthroscopy 2001  . Rotator cuff repair 2/10    right  . Cataract extraction     bilateral  . Tonsillectomy     as a child  . Colonoscopy   . Radioactive seed implant 2008  . Total hip arthroplasty 08/24/2011    Procedure: TOTAL HIP ARTHROPLASTY;  Surgeon: Nilda Simmer, MD;  Location: [facility] OR;  Service: Orthopedics;  Laterality: Right;  DR Thurston Hole WANTS 90 MINUTES FOR SURGERY  . Zenker's diverticulectomy 09/02/2011    Procedure: ZENKER'S DIVERTICULECTOMY;  Surgeon: Christia Reading, MD;  Location: [facility] OR;  Service: ENT;  Laterality: N/A;    Medications: Prior to Admission medications   Medication Sig Start Date End Date Taking? Authorizing  Provider  acetaminophen (TYLENOL) 325 MG tablet Take 650 mg by mouth every 4 (four) hours as needed. For pain or temp > 100.3   Yes Historical Provider, MD  Ascorbic Acid (VITAMIN C) 500 MG tablet Take 500 mg by mouth daily.     Yes Historical Provider, MD  aspirin EC 81 MG EC tablet Take 1 tablet (81 mg total) by mouth daily. 08/27/11 08/26/12 Yes Kirstin J Shepperson, PA  atorvastatin (LIPITOR) 40 MG tablet Take 40 mg by mouth daily. 11/24/10  Yes Lindley Magnus, MD  cetirizine (ZYRTEC) 10 MG tablet Take 10 mg by mouth daily.     Yes Historical Provider, MD  cholecalciferol (VITAMIN D) 1000 UNITS tablet Take 1,000 Units by mouth daily.     Yes Historical Provider, MD  docusate sodium (COLACE) 100 MG capsule Take 100 mg by mouth 2 (two) times daily.   Yes Historical Provider, MD  enoxaparin (LOVENOX) 40 MG/0.4ML SOLN Inject 0.4 mLs (40 mg total) into the skin at bedtime. 08/27/11  Yes Kirstin J  Shepperson, PA  ferrous sulfate 325 (65 FE) MG tablet Take 325 mg by mouth 2 (two) times daily.   Yes Historical Provider, MD  HYDROcodone-acetaminophen (NORCO) 5-325 MG per tablet Take 1 tablet by mouth every 6 (six) hours as needed for pain. 09/04/11  Yes Ripudeep Jenna Luo, MD  insulin aspart (NOVOLOG) 100 UNIT/ML injection Inject 0-12 Units into the skin 3 (three) times daily before meals. Sliding scale: 0-200 = 0u; 201-250 = 2u; 251-300 = 4u; 301-350 = 6u; 351-400 = 8u; 401-450 = 10u; >450 = 12u and call MD.   Yes Historical Provider, MD  levofloxacin (LEVAQUIN) 750 MG tablet Take 1 tablet (750 mg total) by mouth daily. 09/04/11 09/09/11 Yes Ripudeep Jenna Luo, MD  lisinopril (PRINIVIL,ZESTRIL) 40 MG tablet Take 40 mg by mouth daily.   Yes Historical Provider, MD  magnesium hydroxide (MILK OF MAGNESIA) 400 MG/5ML suspension Take 30 mLs by mouth daily as needed. For constipation   Yes Historical Provider, MD  phenol (CHLORASEPTIC) 1.4 % LIQD Use as directed 1 spray in the mouth or throat as needed. For sore throat 08/27/11  Yes Kirstin J Shepperson, PA  potassium chloride SA (K-DUR,KLOR-CON) 20 MEQ tablet Take 40 mEq by mouth daily.   Yes Historical Provider, MD  topiramate (TOPAMAX) 25 MG tablet Take 25 mg by mouth daily. 04/28/11  Yes Lindley Magnus, MD    Allergies:  No Known Allergies  Social History:  reports that he quit smoking about 50 years ago. He does not have any smokeless tobacco history on file. He reports that he drinks alcohol. He reports that he does not use illicit drugs. The patient has been trying to participate in SNF rehab to help for his hip.  He normally lives at home with family..  Family History: Family History  Problem Relation Age of Onset  . Heart disease Father   . Heart attack Father   . Colon polyps Daughter   . Colon cancer Neg Hx   . Anesthesia problems Neg Hx   . Hypotension Neg Hx   . Malignant hyperthermia Neg Hx   . Pseudochol deficiency Neg Hx   .  Hypertension Mother     Physical Exam: Filed Vitals:   09/08/11 2000 09/08/11 2028 09/08/11 2104 09/08/11 2129  BP: 148/70 156/70 138/69 135/71  Pulse: 73 69 62 64  Temp: 98.4 F (36.9 C) 98.6 F (37 C) 98.4 F (36.9 C) 98.5 F (36.9 C)  TempSrc: Oral Oral Oral Oral  Resp: 22 22 24 22   Height: 5\' 8"  (1.727 m)     Weight: 76.1 kg (167 lb 12.3 oz)     SpO2: 100% 99%     Gen: A&O x 3, NAD, looks about stated age, fatigued CV: RRR, S1S2, soft II/VI SEM Lungs: CTA Bilat Abd: Soft, NT, ND, + BS Ext: No clubbing, cyanosis or edema   Labs on Admission:   [facility] 09/08/11 1621  NA 137  K 3.7  CL 103  CO2 24  GLUCOSE 107*  BUN 27*  CREATININE 1.16  CALCIUM 8.3*  MG --  PHOS --    Basename 09/08/11 1621  WBC 12.0*  NEUTROABS 10.4*  HGB 8.6*  HCT 26.1*  MCV 91.9  PLT 439*   Assessment/Plan Present on Admission:  .DIABETES MELLITUS, TYPE II: Sliding scale  .Zenker's hypopharyngeal diverticulum s/p repair.  Stable.  Pt on a mechanical moist diet  .HYPERLIPIDEMIA  .HYPERTENSION:held secondary to hypotension  .Dysphagia: On restricted diet, better after Zenker's repair  .Hypotension: will improve with blood  Constipation: puton bowel regimen  .Chronic hemorrhagic anemia: from surgery 2 weeks ago  After discussion with the patient, he is to be a full code.  We will respect these wishes.  I anticipate his length of stay to be one day based on plans for transfusion, unless something should change.  Time spent on this patient including examination and decision-making process: 40 minutes.  Hollice Espy [phone] 09/08/2011, 10:44 PM

## 2011-09-09 ENCOUNTER — Encounter (HOSPITAL_COMMUNITY): Payer: Self-pay | Admitting: General Practice

## 2011-09-09 LAB — GLUCOSE, CAPILLARY
Glucose-Capillary: 96 mg/dL (ref 70–99)
Glucose-Capillary: 92 mg/dL (ref 70–99)
Glucose-Capillary: 100 mg/dL — ABNORMAL HIGH (ref 70–99)

## 2011-09-09 LAB — TYPE AND SCREEN
ABO/RH(D): A POS
Unit division: 0

## 2011-09-09 LAB — CBC
MCH: 29.2 pg (ref 26.0–34.0)
MCHC: 33 g/dL (ref 30.0–36.0)
Platelets: 326 10*3/uL (ref 150–400)

## 2011-09-09 LAB — BASIC METABOLIC PANEL
Calcium: 7.8 mg/dL — ABNORMAL LOW (ref 8.4–10.5)
Creatinine, Ser: 0.92 mg/dL (ref 0.50–1.35)
GFR calc non Af Amer: 75 mL/min — ABNORMAL LOW (ref >90)
Sodium: 139 mEq/L (ref 135–145)

## 2011-09-09 MED ORDER — STARCH (THICKENING) PO POWD
ORAL | Status: DC | PRN
  Filled 2011-09-09: qty 227

## 2011-09-09 MED ORDER — HYDROCODONE-ACETAMINOPHEN 5-325 MG PO TABS: 1.0000 | ORAL_TABLET | Freq: Four times a day (QID) | ORAL | Status: DC | PRN

## 2011-09-09 MED ORDER — STARCH (THICKENING) PO POWD: ORAL | Status: DC

## 2011-09-09 NOTE — Progress Notes (Signed)
Utilization Review Completed.  Darius Castro  09/09/2011. 

## 2011-09-09 NOTE — Progress Notes (Signed)
CSW completed patient psychosocial assessment, please see below.   Clinical Social Work Department BRIEF PSYCHOSOCIAL ASSESSMENT 09/09/2011  Patient:  Darius Castro, Darius Castro     Account Number:  [id number]     Admit date:  09/08/2011  Clinical Social Worker:  Doree Albee  Date/Time:  09/09/2011 11:00 AM  Referred by:  RN  Date Referred:  09/09/2011 Referred for SNF Placement  Other Referral:   admitted from [facility]  Interview type:  Patient Other interview type:   spoke with pt spoue by phone   PSYCHOSOCIAL DATA Living Status:  FACILITY Admitted from facility:  [facility] Level of care:  Skilled Nursing Facility Primary support name:  [name] Primary support relationship to patient:  SPOUSE Degree of support available:   strong   CURRENT CONCERNS Current Concerns Post-Acute Placement  Other Concerns:    SOCIAL WORK ASSESSMENT / PLAN Pt met with pt at beside who shared with CSW that patient and patient spouse currently live at [facility]. Pt is currently in the skilled nursing at facility for short term rehab due to prior hip fracture when patient was admitted to the hospital. Pt and pt spouse agreed for pt to return to continue short term rehab at [facility]. CSW confirmed with faiclity of pt dc plans.  Assessment/plan status:  Other - See comment Other assessment/ plan:   CSW plans to continue to assist with pt dc plans to return to snf. No further csw needs identified in pt psychoscial assessment.  Information/referral to community resources:   none   PATIENT'S/FAMILY'S RESPONSE TO PLAN OF CARE: Pt and pt spouse are motivated for patient to return to snf at [facility] to continue with physical therapy. Pt and pt spouse are very appreciative of csw concern and support.        Catha Gosselin, Theresia Majors  [phone] .09/09/2011 11:00am

## 2011-09-09 NOTE — Progress Notes (Signed)
   CARE MANAGEMENT NOTE 09/09/2011  Patient:  Darius Castro, Darius Castro   Account Number:  [id number]  Date Initiated:  09/09/2011  Documentation initiated by:  Junius Creamer  Subjective/Objective Assessment:   adm w anemia     Action/Plan:   lives w [facility] at [facility]   Anticipated DC Date:  09/09/2011   Anticipated DC Plan:  SKILLED NURSING FACILITY  In-house referral  Clinical Social Worker      DC Planning Services  CM consult      Choice offered to / List presented to:             Status of service:   Medicare Important Message given?   (If response is "NO", the following Medicare IM given date fields will be blank) Date Medicare IM given:   Date Additional Medicare IM given:    Discharge Disposition:  SKILLED NURSING FACILITY  Per UR Regulation:    If discussed at Long Length of Stay Meetings, dates discussed:    Comments:  3/20 pcp dr Smitty Cords swords, dc back to snf, sw making arrangemnts. debbie Jabarri Stefanelli rn,bsn [phone]

## 2011-09-09 NOTE — Progress Notes (Signed)
Pt being discharged to [facility]. VS [facility]. Pt denies pain or concerns. Discussed discharge instructions with pt including follow up appts, diet and activity restrictions, and medications. Pt verbalized understanding and denied any questions. Report given via phone to Trish Mage at [facility] Pt awaiting transportation. Will continue to monitor pt closely until ambulance arrives.

## 2011-09-09 NOTE — Progress Notes (Addendum)
INITIAL ADULT NUTRITION ASSESSMENT Date: 09/09/2011   Time: 10:50 AM Reason for Assessment: Nectar thickened liquids  ASSESSMENT: [name]  Dx: Chronic hemorrhagic anemia  Hx:  Past Medical History  Diagnosis Date  . Diabetes mellitus   . Hyperlipidemia     takes Lipitor daily  . Adenomatous colon polyp 3/09  . Personal history of prostate cancer   . Dysphagia     with pills  . Hypertension     takes Fosinopril daily  . Sleep apnea     sleep study done in 03/30/04 in epic;doesn't use a CPAP  . Pneumonia     hx of as a child  . Arthritis     back and hip  . Dizziness     pt states when he is hot and stands up too fast  . Back pain     hx of ruptured disc  . Dry skin   . Hemorrhoid     internal   . Hx of colonic polyps   . Nocturia   . Cancer     prostate   . Impaired hearing   . 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 sodium chloride   Intravenous STAT  . aspirin EC  81 mg Oral Daily  . atorvastatin  40 mg Oral Daily  . cholecalciferol  1,000 Units Oral Daily  . docusate sodium  100 mg Oral BID  . enoxaparin  40 mg Subcutaneous QHS  . ferrous sulfate  325 mg Oral BID WC  . insulin aspart  0-5 Units Subcutaneous QHS  . insulin aspart  0-9 Units Subcutaneous TID WC  . levofloxacin  750 mg Oral Q24H  . loratadine  10 mg Oral Daily  . pantoprazole (PROTONIX) IV  40 mg Intravenous Once  . polyethylene glycol  17 g Oral Daily  . potassium chloride SA  40 mEq Oral Daily  . sodium chloride  500 mL Intravenous Once  . sodium phosphate  1 enema Rectal Once  . topiramate  25 mg Oral Daily  . vitamin C  500 mg Oral Daily     Ht: 5\' 8"  (172.7 cm)  Wt: 159 lb 2.8 oz (72.2 kg) (a scale)  Ideal Wt: 70 kg % Ideal Wt: 103%  Usual Wt:  Wt Readings from Last 3 Encounters:  09/09/11 159 lb 2.8 oz (72.2 kg)  09/02/11 154 lb 15.7 oz (70.3 kg)  09/02/11 154 lb 15.7 oz (70.3 kg)    % Usual Wt: 103%  Body mass index is 24.20  kg/(m^2). WNL  Food/Nutrition Related Hx: Patient is on nectar thick liquids  Labs:  CMP     Component Value Date/Time   NA 139 09/09/2011 0505   K 4.0 09/09/2011 0505   CL 107 09/09/2011 0505   CO2 21 09/09/2011 0505   GLUCOSE 83 09/09/2011 0505   BUN 23 09/09/2011 0505   CREATININE 0.92 09/09/2011 0505   CALCIUM 7.8* 09/09/2011 0505   PROT 5.7* 08/29/2011 1300   ALBUMIN 2.5* 08/29/2011 1300   AST 56* 08/29/2011 1300   ALT 53 08/29/2011 1300   ALKPHOS 52 08/29/2011 1300   BILITOT 0.7 08/29/2011 1300   GFRNONAA 75* 09/09/2011 0505   GFRAA 87* 09/09/2011 0505     Intake/Output Summary (Last 24 hours) at 09/09/11 1051 Last data filed at 09/09/11 1000  Gross per 24 hour  Intake 2267.92 ml  Output    508 ml  Net 1759.92 ml  Diet Order: Dysphagia 3 with nectar thick liquids  Supplements/Tube Feeding: none  IVF:    Estimated Nutritional Needs:   Kcal: 1600-1800 Protein:  80-90 gm Fluid:  1.6-1.8 L  Patient states that he is not on thickened liquids anymore, states that when he was d/c from rehab facility they stated he no longer needed thickened liquids. HX of Zenke'rs diverticulum. Is agitated that he still has the thickened liquids, does not want them.   NUTRITION DIAGNOSIS: -Swallowing difficulty (NI-1.1).  Status: Ongoing  RELATED TO: zenker's diverticulum   AS EVIDENCE BY: need for thickened liquids  MONITORING/EVALUATION(Goals): Goal: clarification of need for thickened liquids Monitor: Po intake, diet change, weight EDUCATION NEEDS: -No education needs identified at this time  INTERVENTION: 1. Recommend SLP evaluation for need for thickened liquids, diet texture modification.  Or  2. Clarification with d/c physician from facility  Dietitian [phone]  DOCUMENTATION CODES Per approved criteria  -Not Applicable    Clarene Duke MARIE 09/09/2011, 10:50 AM

## 2011-09-09 NOTE — Progress Notes (Signed)
.  Clinical social worker assisted with patient discharge to skilled nursing facility, [facility]. .Patient transportation provided by [employer] [facility] with patient chart copy. .No further Clinical Social Work needs, signing off.   Catha Gosselin, Theresia Majors  [phone] .09/09/2011 15:14pm

## 2011-09-09 NOTE — Discharge Summary (Signed)
Discharge Summary  Darius Castro MR#: [id number]  DOB:1924/04/11  Date of Admission: 09/08/2011 Date of Discharge: 09/09/2011  Patient's PCP: Darius Petit, MD, MD  Attending Physician:[name]  Consults: None.  Discharge Diagnoses: Principal Problem:  *Chronic hemorrhagic anemia Active Problems:  DIABETES MELLITUS, TYPE II  HYPERLIPIDEMIA  HYPERTENSION  Zenker's hypopharyngeal diverticulum  Dysphagia  Hypotension   Brief Admitting History and Physical Pt is an 78 yr old wm recently discharged from the hospital 2 weeks ago for a hip replacement who returned back for aspiration pneumonia secondary to a Zenker's diverticulum which was able to be repaired by ENT & then a subsequent [facility] his [facility]. He was seen in the ENT office for follow-up & noted to be hypotensive. Labs were checked & he was found to have a Hgb of 7.9. In review of his recent hospital blood work, he had started (prior to his hip repair) to have an Hgb of 13.2. This was most likely felt to have been from blood loss from the original hip surgery and not an acute bleed. ENT discussed this with the patient's PCP's partner, Darius Castro, who made arrangements to have the patient admitted overnight for a blood transfusion (short stay full). THere were reports from the SNF that the patient did have heme-positive stools, so he was evaluated in the ER to ensure he was not having any signs of a GI Bleed.  In the ER, vital sign did confirm an initial systolic BP of 89, yet his HR was normal. He looked fatigued, but not severely volume depleted. Blood was started & hospitalists were called for admit.   Discharge Medications Current Discharge Medication List    START taking these medications   Details  food thickener (THICK IT) POWD Use as needed.      CONTINUE these medications which have CHANGED   Details  HYDROcodone-acetaminophen (NORCO) 5-325 MG per tablet Take 1 tablet by mouth  every 6 (six) hours as needed for pain. Qty: 10 tablet, Refills: 0      CONTINUE these medications which have NOT CHANGED   Details  acetaminophen (TYLENOL) 325 MG tablet Take 650 mg by mouth every 4 (four) hours as needed. For pain or temp > 100.3    Ascorbic Acid (VITAMIN C) 500 MG tablet Take 500 mg by mouth daily.      aspirin EC 81 MG EC tablet Take 1 tablet (81 mg total) by mouth daily.    atorvastatin (LIPITOR) 40 MG tablet Take 40 mg by mouth daily.    cetirizine (ZYRTEC) 10 MG tablet Take 10 mg by mouth daily.      cholecalciferol (VITAMIN D) 1000 UNITS tablet Take 1,000 Units by mouth daily.      docusate sodium (COLACE) 100 MG capsule Take 100 mg by mouth 2 (two) times daily.    ferrous sulfate 325 (65 FE) MG tablet Take 325 mg by mouth 2 (two) times daily.    insulin aspart (NOVOLOG) 100 UNIT/ML injection Inject 0-12 Units into the skin 3 (three) times daily before meals. Sliding scale: 0-200 = 0u; 201-250 = 2u; 251-300 = 4u; 301-350 = 6u; 351-400 = 8u; 401-450 = 10u; >450 = 12u and call MD.    lisinopril (PRINIVIL,ZESTRIL) 40 MG tablet Take 40 mg by mouth daily.    magnesium hydroxide (MILK OF MAGNESIA) 400 MG/5ML suspension Take 30 mLs by mouth daily as needed. For constipation    phenol (CHLORASEPTIC) 1.4 % LIQD Use as  directed 1 spray in the mouth or throat as needed. For sore throat    potassium chloride SA (K-DUR,KLOR-CON) 20 MEQ tablet Take 40 mEq by mouth daily.    topiramate (TOPAMAX) 25 MG tablet Take 25 mg by mouth daily.      STOP taking these medications     enoxaparin (LOVENOX) 40 MG/0.4ML SOLN Comments:  Reason for Stopping:       levofloxacin (LEVAQUIN) 750 MG tablet Comments:  Reason for Stopping:       ammonium lactate (LAC-HYDRIN) 12 % lotion Comments:  Reason for Stopping:       food thickener (RESOURCE THICKENUP CLEAR) POWD Comments:  Reason for Stopping:       Nutritional Supplements (BOOST GLUCOSE CONTROL PO) Comments:  Reason for  Stopping:          Hospital Course: Chronic hemorrhagic anemia Present on Admission:  .DIABETES MELLITUS, TYPE II .Zenker's hypopharyngeal diverticulum .HYPERLIPIDEMIA .HYPERTENSION .Dysphagia .Hypotension .Chronic hemorrhagic anemia   1. Hypotension: Possibly secondary to dehydration and anemia: Resolved post transfusion of packed red blood cells and in fact blood pressures are creeping up. 2. Anemia: Secondary to recent surgery approximately 2 weeks ago. He was transfused 2 units of packed red blood cells with appropriate response. His hemoglobin on admission was 8.6 which was not an acute drop. His hemoglobin since March 9 range between 8.3-8.7. 3. Stool positive for occult blood: No overt GI bleeding. Outpatient evaluation as deemed necessary. 4. Dysphagia: Discussed with the speech therapist who had evaluated him on March 15 and she recommended continued specialized diet as per prior discharge. 5. Type 2 diabetes mellitus: Continue sliding scale insulin as per nursing home regimen. CBGs have ranged between 83-150 mg/dL. 6. Zenker's hypopharyngeal diverticulum, status post repair on prior admission: Continue specialized diet. 7. Hypertension: Patient came in hypotensive and his lisinopril had been held but will be resumed due to blood pressure starting to increase again. The dose can be adjusted as an outpatient as deemed necessary. 8. Right total hip arthroplasty on 08/25/2011. 9. Recent episode of multi-lobar aspiration pneumonia: Patient will complete antibiotics today. Patient denies any cough or dyspnea.  Day of Discharge BP 146/68  Pulse 74  Temp(Src) 98.4 F (36.9 C) (Oral)  Resp 18  Ht 5\' 8"  (1.727 m)  Wt 72.2 kg (159 lb 2.8 oz)  BMI 24.20 kg/m2  SpO2 98%  General exam: Comfortable and in no obvious distress. Respiratory system: Clear. No increased work of breathing. Cardiovascular system: First and second heart sounds heard, regular. No JVD or carotid bruit or pedal  edema. Telemetry shows sinus rhythm with first-degree AV block. Gastrointestinal system: Abdomen is nondistended, soft and normal bowel sounds heard. Central nervous system: Alert and oriented. No focal neurological deficits. Extremities: Symmetrical 5 x 5 power.  Results for orders placed during the hospital encounter of 09/08/11 (from the past 48 hour(s))  PREPARE RBC (CROSSMATCH)     Status: Normal   Collection Time   09/08/11  4:00 PM      Component Value Range Comment   Order Confirmation ORDER PROCESSED BY BLOOD BANK     TYPE AND SCREEN     Status: Normal   Collection Time   09/08/11  4:00 PM      Component Value Range Comment   ABO/RH(D) A POS      Antibody Screen NEG      Sample Expiration 09/11/2011      Unit Number [id number]      Blood Component Type  RED CELLS,LR      Unit division 00      Status of Unit ISSUED,FINAL      Transfusion Status OK TO TRANSFUSE      Crossmatch Result Compatible      Unit Number [id number]      Blood Component Type RED CELLS,LR      Unit division 00      Status of Unit ISSUED,FINAL      Transfusion Status OK TO TRANSFUSE      Crossmatch Result Compatible     CBC     Status: Abnormal   Collection Time   09/08/11  4:21 PM      Component Value Range Comment   WBC 12.0 (*) 4.0 - 10.5 (K/uL)    RBC 2.84 (*) 4.22 - 5.81 (MIL/uL)    Hemoglobin 8.6 (*) 13.0 - 17.0 (g/dL)    HCT 40.9 (*) 81.1 - 52.0 (%)    MCV 91.9  78.0 - 100.0 (fL)    MCH 30.3  26.0 - 34.0 (pg)    MCHC 33.0  30.0 - 36.0 (g/dL)    RDW 91.4  78.2 - 95.6 (%)    Platelets 439 (*) 150 - 400 (K/uL)   DIFFERENTIAL     Status: Abnormal   Collection Time   09/08/11  4:21 PM      Component Value Range Comment   Neutrophils Relative 86 (*) 43 - 77 (%)    Neutro Abs 10.4 (*) 1.7 - 7.7 (K/uL)    Lymphocytes Relative 8 (*) 12 - 46 (%)    Lymphs Abs 0.9  0.7 - 4.0 (K/uL)    Monocytes Relative 6  3 - 12 (%)    Monocytes Absolute 0.7  0.1 - 1.0 (K/uL)    Eosinophils Relative 0  0 - 5 (%)     Eosinophils Absolute 0.0  0.0 - 0.7 (K/uL)    Basophils Relative 0  0 - 1 (%)    Basophils Absolute 0.0  0.0 - 0.1 (K/uL)   BASIC METABOLIC PANEL     Status: Abnormal   Collection Time   09/08/11  4:21 PM      Component Value Range Comment   Sodium 137  135 - 145 (mEq/L)    Potassium 3.7  3.5 - 5.1 (mEq/L)    Chloride 103  96 - 112 (mEq/L)    CO2 24  19 - 32 (mEq/L)    Glucose, Bld 107 (*) 70 - 99 (mg/dL)    BUN 27 (*) 6 - 23 (mg/dL)    Creatinine, Ser 2.13  0.50 - 1.35 (mg/dL)    Calcium 8.3 (*) 8.4 - 10.5 (mg/dL)    GFR calc non Af Amer 56 (*) >90 (mL/min)    GFR calc Af Amer 64 (*) >90 (mL/min)   GLUCOSE, CAPILLARY     Status: Normal   Collection Time   09/09/11  1:15 AM      Component Value Range Comment   Glucose-Capillary 96  70 - 99 (mg/dL)    Comment 1 Notify RN     CBC     Status: Abnormal   Collection Time   09/09/11  5:05 AM      Component Value Range Comment   WBC 7.6  4.0 - 10.5 (K/uL)    RBC 3.60 (*) 4.22 - 5.81 (MIL/uL)    Hemoglobin 10.5 (*) 13.0 - 17.0 (g/dL)    HCT 08.6 (*) 57.8 - 52.0 (%)    MCV 88.3  78.0 - 100.0 (fL)    MCH 29.2  26.0 - 34.0 (pg)    MCHC 33.0  30.0 - 36.0 (g/dL)    RDW 16.1  09.6 - 04.5 (%)    Platelets 326  150 - 400 (K/uL)   BASIC METABOLIC PANEL     Status: Abnormal   Collection Time   09/09/11  5:05 AM      Component Value Range Comment   Sodium 139  135 - 145 (mEq/L)    Potassium 4.0  3.5 - 5.1 (mEq/L) HEMOLYSIS AT THIS LEVEL MAY AFFECT RESULT   Chloride 107  96 - 112 (mEq/L)    CO2 21  19 - 32 (mEq/L)    Glucose, Bld 83  70 - 99 (mg/dL)    BUN 23  6 - 23 (mg/dL)    Creatinine, Ser 4.09  0.50 - 1.35 (mg/dL)    Calcium 7.8 (*) 8.4 - 10.5 (mg/dL)    GFR calc non Af Amer 75 (*) >90 (mL/min)    GFR calc Af Amer 87 (*) >90 (mL/min)   GLUCOSE, CAPILLARY     Status: Normal   Collection Time   09/09/11  5:46 AM      Component Value Range Comment   Glucose-Capillary 92  70 - 99 (mg/dL)    Comment 1 Notify RN       Dg Chest 2  View  08/29/2011  *RADIOLOGY REPORT*  Clinical Data: History of cough and fever.  CHEST - 2 VIEW  Comparison: Multiple priors, most recently 08/26/2011.  Findings: Compared to the recent radiograph from 08/26/2011, there is now an area of ill-defined interstitial prominence and patchy air space disease in the left midlung, and potentially the left lower lobe (retrocardiac region), as well as the medial aspect of the right lower lobe.  Findings are concerning for multilobar pneumonia.  Small bilateral pleural effusions are noted on the lateral projection.  Pulmonary vasculature is normal.  Heart size is within normal limits.  Mediastinal contours are unremarkable. Atherosclerosis of the thoracic aorta.  Marked gaseous distension of the stomach is again noted.  IMPRESSION: 1.  Interval development of patchy interstitial and airspace disease in the left mid lung and lower lobes of the lungs bilaterally, with small bilateral pleural effusions.  Findings are concerning for developing multilobar pneumonia. 2.  Atherosclerosis. 3.  Persistent gaseous distension of the stomach.  Original Report Authenticated By: Florencia Reasons, M.D.   Dg Chest 2 View  08/26/2011  *RADIOLOGY REPORT*  Clinical Data: Evaluate atelectasis or pneumonia.  CHEST - 2 VIEW  Comparison: 08/20/2011  Findings: Gaseous distention of the stomach and upper abdominal bowel loops underneath the right hemidiaphragm.  Low lung volumes with bibasilar atelectasis.  No effusions.  Heart is normal size. No acute bony abnormality.  IMPRESSION: Low lung volumes with bibasilar atelectasis.  Gaseous distention of the stomach and upper abdominal bowel loops under the right hemidiaphragm.  Original Report Authenticated By: Cyndie Chime, M.D.   Dg Chest 2 View  08/20/2011  *RADIOLOGY REPORT*  Clinical Data: Preop  CHEST - 2 VIEW  Comparison: 07/25/2008  Findings: Cardiomediastinal silhouette is stable.  Mild hyperinflation.  No acute infiltrate or pleural  effusion.  No pulmonary edema.  Mild degenerative changes thoracic spine.  IMPRESSION: No active disease.  Original Report Authenticated By: Natasha Mead, M.D.   Dg Hip Complete Right  08/24/2011  *RADIOLOGY REPORT*  Clinical Data: Postop right hip replacement.  PORTABLE PELVIS,RIGHT HIP - COMPLETE 2+ VIEW  Comparison: None.  Findings: Total right hip replacement appears in satisfactory position.  Surrounding lucencies appear to represent gas rather than fracture.  Prostatic seed implants in place.  Degenerative changes lower lumbar spine.  IMPRESSION: Satisfactory position total right hip replacement.  Original Report Authenticated By: Fuller Canada, M.D.   Dg Abd 1 View  08/26/2011  *RADIOLOGY REPORT*  Clinical Data: Right abdominal pain.  ABDOMEN - 1 VIEW  Comparison: One-view pelvis 08/24/2011.  Abdominal CT 04/03/2004.  Findings: The upper abdomen is excluded.  There is mild diffuse distension of the stomach, small and large bowel.  No bowel wall thickening is identified.  There is no supine evidence of free intraperitoneal air.  Prostatic brachytherapy seeds appear unchanged.  There are stable mild degenerative changes of the lower lumbar spine and left hip.  Patient is status post right hip arthroplasty.  IMPRESSION: Diffuse bowel distension most consistent with ileus.  Correlate clinically.  Original Report Authenticated By: Gerrianne Scale, M.D.   Dg Esophagus  08/26/2011  *RADIOLOGY REPORT*  Clinical Data: History of aspiration.  Probable Zenker's diverticulum noted during modified barium swallow.  ESOPHOGRAM/BARIUM SWALLOW  Technique:  Single contrast examination was performed using thick barium.  Fluoroscopy time:  1.27 minutes.  Comparison:  No priors.  Findings:  Limited images were obtained with the patient swallowing thickened barium liquid.  Initial images of the thorax prior to swallow attempts demonstrated retained barium contrast within a focal outpouching of the proximal third of the  esophagus, consistent with a Zenker's diverticulum.  The remainder of the esophagus was otherwise markedly patulous (distal esophagus was several centimeters in diameter). Multiple swallows were observed during fluoroscopy, and the Zenker's diverticulum repeatedly filled and partially emptied.  IMPRESSION: 1.  Limited single contrast barium esophagram confirms the presence of a large Zenker's diverticulum.  Original Report Authenticated By: Florencia Reasons, M.D.   Dg Pelvis Portable  08/24/2011  *RADIOLOGY REPORT*  Clinical Data: Postop right hip replacement.  PORTABLE PELVIS,RIGHT HIP - COMPLETE 2+ VIEW  Comparison: None.  Findings: Total right hip replacement appears in satisfactory position.  Surrounding lucencies appear to represent gas rather than fracture.  Prostatic seed implants in place.  Degenerative changes lower lumbar spine.  IMPRESSION: Satisfactory position total right hip replacement.  Original Report Authenticated By: Fuller Canada, M.D.   Dg Intro Long Gi Tube  09/01/2011  *RADIOLOGY REPORT*  Clinical Data: Feeding tube placement.  INTRO LONG GI TUBE  Technique: Fluoroscopic images obtained during the feeding tube placement.  Fluoroscopy Time:  8.8 minutes.  Comparison:  No priors.  Findings: Multiple attempts were made in both frontal and lateral projections to pass a 12-French feeding tube into the proximal small bowel.  Unfortunately, the tube extended either into the trachea, or extended posterior within a large Zenker's diverticulum (the tube never extended down into the lower esophagus).  Despite multiple repeat attempts over greater than a 15-minute time period, placement of the feeding tube was unsuccessful.  The tube was withdrawn from the patient.  IMPRESSION: 1.  Unsuccessful attempts and fluoroscopic guided placement of a feeding tube, as above.  Original Report Authenticated By: Florencia Reasons, M.D.   Dg Swallowing Func-no Report  09/03/2011  CLINICAL DATA: dysphagia    FLUOROSCOPY FOR SWALLOWING FUNCTION STUDY:  Fluoroscopy was provided for swallowing function study, which was  administered by a speech pathologist.  Final results and recommendations  from this study are contained within the speech pathology report.     Dg  Swallowing Func-no Report  08/26/2011  CLINICAL DATA: dysphagia   FLUOROSCOPY FOR SWALLOWING FUNCTION STUDY:  Fluoroscopy was provided for swallowing function study, which was  administered by a speech pathologist.  Final results and recommendations  from this study are contained within the speech pathology report.       Disposition: Discharge to skilled nursing facility in stable condition.  Diet: Dys 3 (mechanical soft diet) with MOIST solids only (extra gravy/sauce), moisten breads with liquid. Turn head to left when swallowing solids. Swallow multiple times. Follow solids with liquids, crush meds. No straws.    Activity: Increase activity gradually.   Follow-up Appts: Discharge Orders    Future Appointments: Provider: Department: Dept Phone: Center:   09/14/2011 9:15 AM Lindley Magnus, MD [facility] [phone] [facility]     Future Orders Please Complete By Expires   Increase activity slowly      Discharge instructions      Comments:   Dietary Recommendations:  Dys 3 (mechanical soft diet) with MOIST solids only (extra gravy/sauce), moisten breads with liquid. Turn head to left when swallowing solids. Swallow multiple times. Follow solids with liquids, crush meds. No straws.   Call MD for:  persistant dizziness or light-headedness         TESTS THAT NEED FOLLOW-UP Periodically follow BMPs because he is on potassium supplements.  Time spent on discharge, talking to the patient, and coordinating care: 35 mins.   SignedMarcellus Scott, MD 09/09/2011, 12:16 PM

## 2011-09-14 ENCOUNTER — Ambulatory Visit: Payer: Medicare Other | Admitting: Internal Medicine

## 2011-09-17 ENCOUNTER — Inpatient Hospital Stay: Payer: Medicare Other | Admitting: Adult Health

## 2011-11-09 ENCOUNTER — Other Ambulatory Visit: Payer: Self-pay | Admitting: Dermatology

## 2012-02-05 ENCOUNTER — Encounter: Payer: Self-pay | Admitting: Internal Medicine

## 2012-02-05 ENCOUNTER — Ambulatory Visit (INDEPENDENT_AMBULATORY_CARE_PROVIDER_SITE_OTHER): Payer: Medicare Other | Admitting: Internal Medicine

## 2012-02-05 VITALS — BP 168/78 | HR 76 | Temp 98.2°F | Wt 152.0 lb

## 2012-02-05 DIAGNOSIS — R42 Dizziness and giddiness: Secondary | ICD-10-CM

## 2012-02-05 DIAGNOSIS — I10 Essential (primary) hypertension: Secondary | ICD-10-CM

## 2012-02-05 DIAGNOSIS — D500 Iron deficiency anemia secondary to blood loss (chronic): Secondary | ICD-10-CM

## 2012-02-05 DIAGNOSIS — I951 Orthostatic hypotension: Secondary | ICD-10-CM

## 2012-02-05 DIAGNOSIS — D649 Anemia, unspecified: Secondary | ICD-10-CM

## 2012-02-05 LAB — CBC WITH DIFFERENTIAL/PLATELET
Basophils Absolute: 0 10*3/uL (ref 0.0–0.1)
Eosinophils Relative: 0.5 % (ref 0.0–5.0)
MCV: 93.5 fl (ref 78.0–100.0)
Monocytes Absolute: 0.4 10*3/uL (ref 0.1–1.0)
Monocytes Relative: 5.9 % (ref 3.0–12.0)
Neutrophils Relative %: 80.1 % — ABNORMAL HIGH (ref 43.0–77.0)
Platelets: 201 10*3/uL (ref 150.0–400.0)
RDW: 13.9 % (ref 11.5–14.6)
WBC: 6.5 10*3/uL (ref 4.5–10.5)

## 2012-02-05 LAB — T4, FREE: Free T4: 0.88 ng/dL (ref 0.60–1.60)

## 2012-02-05 LAB — BASIC METABOLIC PANEL
BUN: 21 mg/dL (ref 6–23)
Creatinine, Ser: 0.9 mg/dL (ref 0.4–1.5)
GFR: 86.08 mL/min (ref >60.00)
Glucose, Bld: 133 mg/dL — ABNORMAL HIGH (ref 70–99)

## 2012-02-05 LAB — TSH: TSH: 2.06 u[IU]/mL (ref 0.35–5.50)

## 2012-02-05 NOTE — Assessment & Plan Note (Signed)
Patient experiencing sporadic elevated blood pressure reading after episode of vertigo. He is actually orthostatic. Continue lisinopril 40 mg for now. Initiate workup for orthostasis.

## 2012-02-05 NOTE — Assessment & Plan Note (Signed)
Monitor CBCD.  Lab Results  Component Value Date   WBC 7.6 09/09/2011   HGB 10.5* 09/09/2011   HCT 31.8* 09/09/2011   MCV 88.3 09/09/2011   PLT 326 09/09/2011

## 2012-02-05 NOTE — Patient Instructions (Addendum)
Our office will contact you re: blood test results and referral to vestibular rehabilitation Increase her fluid intake Follow up with your Dr. Cato Mulligan within one week.

## 2012-02-05 NOTE — Progress Notes (Signed)
Subjective:    Patient ID: Darius Castro, male    DOB: 1923/12/13, 78 y.o.   MRN: [id number]  HPI  78 year old white male with history of hypertension, hyperlipidemia and type 2 diabetes presents with dizziness and elevated blood pressure. Patient's chart was reviewed. Within the last 3-4 months he had right hip replacement. He was also hospitalized for pneumonia and Zenker's diverticulum. Patient followed for postop anemia.  Patient reports going to the bathroom this morning and after he bent forward he felt like he was going to pass out. He felt the room was spinning. This has occurred in the past. He has history of positional vertigo. Soon after this episode, he measured his blood pressure which rose to 200/100. It later decreased to 155/78.   Review of Systems Patient complains of weakness and dizziness Negative for chest pain or shortness of breath  Past Medical History  Diagnosis Date  . Hyperlipidemia     takes Lipitor daily  . Adenomatous colon polyp 3/09  . Personal history of prostate cancer   . Dysphagia     with pills  . Hypertension     takes Fosinopril daily  . Sleep apnea     sleep study done in 03/30/04 in epic;doesn't use a CPAP  . Pneumonia     hx of as a child  . Arthritis     back and hip  . Dizziness     pt states when he is hot and stands up too fast  . Back pain     hx of ruptured disc  . Dry skin   . Hemorrhoid     internal   . Hx of colonic polyps   . Nocturia   . Cancer     prostate   . Impaired hearing   . 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 Diabetes mellitus     patient denies Diabetes    History   Social History  . Marital Status: Married    Spouse Name: N/A    Number of Children: N/A  . Years of Education: N/A   Occupational History  . Not on file.   Social History Main Topics  . Smoking status: Former Smoker    Quit date: 06/22/1961  . Smokeless tobacco: Never Used  . Alcohol Use: Yes     red wine 3-4 times/wk    . Drug Use: No  . Sexually Active: Not Currently   Other Topics Concern  . Not on file   Social History Narrative  . No narrative on file    Past Surgical History  Procedure Date  . Lumbar laminectomy 1998  . Knee arthroscopy 2001  . Rotator cuff repair 2/10    right  . Cataract extraction     bilateral  . Tonsillectomy     as a child  . Colonoscopy   . Radioactive seed implant 2008  . Total hip arthroplasty 08/24/2011    Procedure: TOTAL HIP ARTHROPLASTY;  Surgeon: Nilda Simmer, MD;  Location: [facility] OR;  Service: Orthopedics;  Laterality: Right;  DR Thurston Hole WANTS 90 MINUTES FOR SURGERY  . Zenker's diverticulectomy 09/02/2011    Procedure: ZENKER'S DIVERTICULECTOMY;  Surgeon: Christia Reading, MD;  Location: [facility] OR;  Service: ENT;  Laterality: N/A;    Family History  Problem Relation Age of Onset  . Heart disease Father   . Heart attack Father   . Colon polyps Daughter   . Colon cancer Neg Hx   . Anesthesia problems Neg Hx   .  Hypotension Neg Hx   . Malignant hyperthermia Neg Hx   . Pseudochol deficiency Neg Hx   . Hypertension Mother     No Known Allergies  Current Outpatient Prescriptions on File Prior to Visit  Medication Sig Dispense Refill  . Ascorbic Acid (VITAMIN C) 500 MG tablet Take 500 mg by mouth daily.        Marland Kitchen atorvastatin (LIPITOR) 40 MG tablet Take 40 mg by mouth daily.      . cholecalciferol (VITAMIN D) 1000 UNITS tablet Take 1,000 Units by mouth daily.        Marland Kitchen lisinopril (PRINIVIL,ZESTRIL) 40 MG tablet Take 40 mg by mouth daily.      Marland Kitchen topiramate (TOPAMAX) 25 MG tablet Take 25 mg by mouth daily.      Marland Kitchen DISCONTD: fosinopril (MONOPRIL) 40 MG tablet Take 40 mg by mouth daily.       Current Facility-Administered Medications on File Prior to Visit  Medication Dose Route Frequency Provider Last Rate Last Dose  . povidone-iodine (BETADINE) 7.5 % scrub   Topical Once Kirstin J Shepperson, PA        BP 168/78  Pulse 76  Temp 98.2 F (36.8 C) (Oral)  Wt  152 lb (68.947 kg)  Blood pressure 150/70 sitting and 120/60 standing     Objective:   Physical Exam  Constitutional:       Frail 78 year old white male  HENT:  Head: Normocephalic and atraumatic.  Right Ear: External ear normal.  Mouth/Throat: Oropharynx is clear and moist.  Eyes: EOM are normal. Pupils are equal, round, and reactive to light.  Neck: Neck supple.       No carotid bruit  Pulmonary/Chest: Effort normal and breath sounds normal. He has no wheezes.  Musculoskeletal: He exhibits no edema.  Neurological: No cranial nerve deficit.       Unsteady gait  Skin: Skin is warm and dry.  Psychiatric: He has a normal mood and affect. His behavior is normal.        Assessment & Plan:

## 2012-02-12 ENCOUNTER — Encounter: Payer: Self-pay | Admitting: Internal Medicine

## 2012-02-12 ENCOUNTER — Ambulatory Visit (INDEPENDENT_AMBULATORY_CARE_PROVIDER_SITE_OTHER): Payer: Medicare Other | Admitting: Internal Medicine

## 2012-02-12 VITALS — BP 120/60 | Temp 98.7°F | Wt 150.0 lb

## 2012-02-12 DIAGNOSIS — IMO0002 Reserved for concepts with insufficient information to code with codable children: Secondary | ICD-10-CM | POA: Insufficient documentation

## 2012-02-12 DIAGNOSIS — H8110 Benign paroxysmal vertigo, unspecified ear: Secondary | ICD-10-CM

## 2012-02-12 DIAGNOSIS — I951 Orthostatic hypotension: Secondary | ICD-10-CM | POA: Insufficient documentation

## 2012-02-12 NOTE — Patient Instructions (Addendum)
Please follow up with Dr. Cato Mulligan within 3 months. Use abdominal binder as directed. Please take your time as you change positions (standing up after prolonged sitting).

## 2012-02-12 NOTE — Progress Notes (Signed)
Subjective:    Patient ID: Darius Castro, male    DOB: Feb 15, 1924, 78 y.o.   MRN: [id number]  HPI   78 year old white male previously seen for postural hypotension for follow up.   Follow blood work shows mild improvement in his anemia. His electrolytes and kidney function are normal. His spot cortisol level was within normal limits. Patient reports home blood pressure readings are somewhat labile. He has chronic mild dizziness with standing. He also has intermittent vertigo symptoms when he bends down to pick up an object.    Review of Systems Negative for chest pain.  Negative for syncope  Past Medical History  Diagnosis Date  . Hyperlipidemia     takes Lipitor daily  . Adenomatous colon polyp 3/09  . Personal history of prostate cancer   . Dysphagia     with pills  . Hypertension     takes Fosinopril daily  . Sleep apnea     sleep study done in 03/30/04 in epic;doesn't use a CPAP  . Pneumonia     hx of as a child  . Arthritis     back and hip  . Dizziness     pt states when he is hot and stands up too fast  . Back pain     hx of ruptured disc  . Dry skin   . Hemorrhoid     internal   . Hx of colonic polyps   . Nocturia   . Cancer     prostate   . Impaired hearing   . 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 Diabetes mellitus     patient denies Diabetes    History   Social History  . Marital Status: Married    Spouse Name: N/A    Number of Children: N/A  . Years of Education: N/A   Occupational History  . Not on file.   Social History Main Topics  . Smoking status: Former Smoker    Quit date: 06/22/1961  . Smokeless tobacco: Never Used  . Alcohol Use: Yes     red wine 3-4 times/wk  . Drug Use: No  . Sexually Active: Not Currently   Other Topics Concern  . Not on file   Social History Narrative  . No narrative on file    Past Surgical History  Procedure Date  . Lumbar laminectomy 1998  . Knee arthroscopy 2001  . Rotator cuff repair  2/10    right  . Cataract extraction     bilateral  . Tonsillectomy     as a child  . Colonoscopy   . Radioactive seed implant 2008  . Total hip arthroplasty 08/24/2011    Procedure: TOTAL HIP ARTHROPLASTY;  Surgeon: Nilda Simmer, MD;  Location: [facility] OR;  Service: Orthopedics;  Laterality: Right;  DR Thurston Hole WANTS 90 MINUTES FOR SURGERY  . Zenker's diverticulectomy 09/02/2011    Procedure: ZENKER'S DIVERTICULECTOMY;  Surgeon: Christia Reading, MD;  Location: [facility] OR;  Service: ENT;  Laterality: N/A;    Family History  Problem Relation Age of Onset  . Heart disease Father   . Heart attack Father   . Colon polyps Daughter   . Colon cancer Neg Hx   . Anesthesia problems Neg Hx   . Hypotension Neg Hx   . Malignant hyperthermia Neg Hx   . Pseudochol deficiency Neg Hx   . Hypertension Mother     No Known Allergies  Current Outpatient Prescriptions on File Prior to Visit  Medication  Sig Dispense Refill  . Ascorbic Acid (VITAMIN C) 500 MG tablet Take 500 mg by mouth daily.        Marland Kitchen aspirin 325 MG tablet Take 325 mg by mouth daily.      Marland Kitchen atorvastatin (LIPITOR) 40 MG tablet Take 40 mg by mouth daily.      . cholecalciferol (VITAMIN D) 1000 UNITS tablet Take 1,000 Units by mouth daily.        Marland Kitchen lisinopril (PRINIVIL,ZESTRIL) 40 MG tablet Take 40 mg by mouth daily.      . Multiple Vitamin (MULTIVITAMIN) tablet Take 1 tablet by mouth daily.      Marland Kitchen topiramate (TOPAMAX) 25 MG tablet Take 25 mg by mouth daily.      Marland Kitchen DISCONTD: fosinopril (MONOPRIL) 40 MG tablet Take 40 mg by mouth daily.       Current Facility-Administered Medications on File Prior to Visit  Medication Dose Route Frequency Provider Last Rate Last Dose  . povidone-iodine (BETADINE) 7.5 % scrub   Topical Once Kirstin J Shepperson, PA        BP 120/60  Temp 98.7 F (37.1 C) (Oral)  Wt 150 lb (68.04 kg)       Objective:   Physical Exam  Constitutional: He is oriented to person, place, and time. He appears well-developed and  well-nourished.  Cardiovascular: Normal rate, regular rhythm and normal heart sounds.   Pulmonary/Chest: Effort normal and breath sounds normal. He has no wheezes.  Musculoskeletal: He exhibits no edema.  Neurological: He is alert and oriented to person, place, and time.  Psychiatric: He has a normal mood and affect. His behavior is normal.          Assessment & Plan:

## 2012-02-12 NOTE — Assessment & Plan Note (Signed)
Patient has intermittent symptoms of positional vertigo. He defers referral for vestibular rehab for now.  Patient advised to call office if symptoms worsen.

## 2012-02-12 NOTE — Assessment & Plan Note (Signed)
78 y/o white male with postural hypotension.  I doubt symptoms secondary to adrenal insuff.   Patient advised to continue taking his time standing up after prolonged sitting. Trial of abdominal binder to see if it helps with venous pooling.  Blood pressure sitting is 120/60, Standing 90/60

## 2012-02-29 ENCOUNTER — Other Ambulatory Visit: Payer: Self-pay | Admitting: *Deleted

## 2012-02-29 MED ORDER — ATORVASTATIN CALCIUM 40 MG PO TABS: 40.0000 mg | ORAL_TABLET | Freq: Every day | ORAL | Status: DC

## 2012-03-07 ENCOUNTER — Ambulatory Visit (INDEPENDENT_AMBULATORY_CARE_PROVIDER_SITE_OTHER): Payer: Medicare Other

## 2012-03-07 DIAGNOSIS — Z23 Encounter for immunization: Secondary | ICD-10-CM

## 2012-03-09 ENCOUNTER — Ambulatory Visit (INDEPENDENT_AMBULATORY_CARE_PROVIDER_SITE_OTHER): Payer: Medicare Other | Admitting: Family Medicine

## 2012-03-09 ENCOUNTER — Encounter: Payer: Self-pay | Admitting: Family Medicine

## 2012-03-09 VITALS — BP 128/70 | HR 83 | Temp 98.3°F | Wt 152.0 lb

## 2012-03-09 DIAGNOSIS — D649 Anemia, unspecified: Secondary | ICD-10-CM

## 2012-03-09 DIAGNOSIS — R5383 Other fatigue: Secondary | ICD-10-CM

## 2012-03-09 LAB — CBC WITH DIFFERENTIAL/PLATELET
Basophils Absolute: 0 10*3/uL (ref 0.0–0.1)
Basophils Relative: 0.3 % (ref 0.0–3.0)
Eosinophils Absolute: 0.1 10*3/uL (ref 0.0–0.7)
Lymphocytes Relative: 16.2 % (ref 12.0–46.0)
MCHC: 32.8 g/dL (ref 30.0–36.0)
MCV: 95.1 fl (ref 78.0–100.0)
Monocytes Absolute: 0.4 10*3/uL (ref 0.1–1.0)
Neutrophils Relative %: 74.2 % (ref 43.0–77.0)
Platelets: 187 10*3/uL (ref 150.0–400.0)
RBC: 3.65 Mil/uL — ABNORMAL LOW (ref 4.22–5.81)

## 2012-03-09 NOTE — Patient Instructions (Addendum)
-  eat healthy meals three times per day - lots of fruits and vegetables  -continue exercising  -continue taking your iron  --We have ordered labs for your at this visit. It usually takes 1-2 weeks for these to result and be processed. We will contact you with instructions if your results are abnormal. Normal results will be release to your [facility] in 1-2 weeks. If you have not heard from [place] or can not find your results in [facility] in 2 weeks please contact our office.  Thank you for enrolling in MyChart. Please follow the instructions below to securely access your online medical record. MyChart allows you to send messages to your doctor, view your test results, renew your prescriptions, schedule appointments, and more.  How Do I Sign Up? 1. In your Internet browser, go to [email or web address] 2. Click on the New  User? link in the Sign In box.  3. Enter your MyChart Access Code exactly as it appears below. You will not need to use this code after you have completed the sign-up process. If you do not sign up before the expiration date, you must request a new code. MyChart Access Code: [email or web address] Expires: 04/08/2012  9:33 AM  4. Enter the last four digits of your Social Security Number (xxxx) and Date of Birth (mm/dd/yyyy) as indicated and click Next. You will be taken to the next sign-up page. 5. Create a MyChart ID. This will be your MyChart login ID and cannot be changed, so think of one that is secure and easy to remember. 6. Create a MyChart password. You can change your password at any time. 7. Enter your Password Reset Question and Answer and click Next. This can be used at a later time if you forget your password.  8. Select your communication preference, and if applicable enter your e-mail address. You will receive e-mail notification when new information is available in MyChart by choosing to receive e-mail notifications and filling in your e-mail. 9. Click Sign  In. You can now view your medical record.   Additional Information If you have questions, you can email [email or web address] or call [phone] to talk to our MyChart staff. Remember, MyChart is NOT to be used for urgent needs. For medical emergencies, dial 911.

## 2012-03-09 NOTE — Progress Notes (Signed)
Chief Complaint  Patient presents with  . Fatigue    hasnt regained strength in 6 months from hip surgery     HPI:  Fatigue: -started after multiple surgeries in March 2013 - hip replacement, esophageal surgery for diverticulum -not getting any worse -also had pneumonia when in the hospital -just hasn't felt like he has had as much energy since hospitalization -has been through PT and is exercising 3 days a week without problems now -told his orthopeadic surgeon about this and he recommended he see his PCP  -has physical scheduled Nov 8th -sleeps well at night and feels rested in the morning -denies: weight loss - has actually gained some weight back since surgery, fevers, body aches, falls, weakness in legs, chills, nausea, vomiting, cough, trouble breathing, chest pains, swelling, night sweats, depression, cognitive changes, nasal congestion, sore throat, melena or hematochezia, apnea at night -taking 65mg  tablet of iron daily for his anemia  ROS: See pertinent positives and negatives per HPI.  Past Medical History  Diagnosis Date  . Hyperlipidemia     takes Lipitor daily  . Adenomatous colon polyp 3/09  . Personal history of prostate cancer   . Dysphagia     with pills  . Hypertension     takes Fosinopril daily  . Sleep apnea     sleep study done in 03/30/04 in epic;doesn't use a CPAP  . Pneumonia     hx of as a child  . Arthritis     back and hip  . Dizziness     pt states when he is hot and stands up too fast  . Back pain     hx of ruptured disc  . Dry skin   . Hemorrhoid     internal   . Hx of colonic polyps   . Nocturia   . Cancer     prostate   . Impaired hearing   . 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 Diabetes mellitus     patient denies Diabetes    Family History  Problem Relation Age of Onset  . Heart disease Father   . Heart attack Father   . Colon polyps Daughter   . Colon cancer Neg Hx   . Anesthesia problems Neg Hx   .  Hypotension Neg Hx   . Malignant hyperthermia Neg Hx   . Pseudochol deficiency Neg Hx   . Hypertension Mother     History   Social History  . Marital Status: Married    Spouse Name: N/A    Number of Children: N/A  . Years of Education: N/A   Social History Main Topics  . Smoking status: Former Smoker    Quit date: 06/22/1961  . Smokeless tobacco: Never Used  . Alcohol Use: Yes     red wine 3-4 times/wk  . Drug Use: No  . Sexually Active: Not Currently   Other Topics Concern  . None   Social History Narrative  . None    Current outpatient prescriptions:Ascorbic Acid (VITAMIN C) 500 MG tablet, Take 500 mg by mouth daily.  , Disp: , Rfl: ;  aspirin 325 MG tablet, Take 325 mg by mouth daily., Disp: , Rfl: ;  atorvastatin (LIPITOR) 40 MG tablet, Take 1 tablet (40 mg total) by mouth daily., Disp: 90 tablet, Rfl: 3;  cholecalciferol (VITAMIN D) 1000 UNITS tablet, Take 1,000 Units by mouth daily.  , Disp: , Rfl:  lisinopril (PRINIVIL,ZESTRIL) 40 MG tablet, Take 40 mg by mouth daily., Disp: ,  Rfl: ;  Multiple Vitamin (MULTIVITAMIN) tablet, Take 1 tablet by mouth daily., Disp: , Rfl: ;  topiramate (TOPAMAX) 25 MG tablet, Take 25 mg by mouth daily., Disp: , Rfl: ;  DISCONTD: fosinopril (MONOPRIL) 40 MG tablet, Take 40 mg by mouth daily., Disp: , Rfl:  No current facility-administered medications for this visit. Facility-Administered Medications Ordered in Other Visits: povidone-iodine (BETADINE) 7.5 % scrub, , Topical, Once, Kirstin J Shepperson, PA  EXAM:  Filed Vitals:   03/09/12 0912  BP: 128/70  Pulse: 83  Temp: 98.3 F (36.8 C)    There is no height on file to calculate BMI.  GENERAL: vitals reviewed and listed below, alert, oriented, appears well hydrated and in no acute distress  HEENT: atraumatic, conjucntiva clear, no obvious abnormalities on inspection  NECK: no cervical or [place] LAD  LUNGS: clear to auscultation bilaterally, no wheezes, rales or rhonchi, good air  movement  CV: HRRR, I/VI SEM, no peripheral edema  MS: moves all extremities without noticeable abnormality  PSYCH: pleasant and cooperative, no obvious depression or anxiety  ASSESSMENT AND PLAN:  Discussed the following assessment and plan:  1. Fatigue  CBC with Differential  2. Anemia  CBC with Differential   Likely deconditioning after extensive illness and surgery earlier this year. No alarming symptoms per HPI and weight improving. Had fairly extensive lab workup 1 month ago with CBC, BMP, TSH, cortisol and these results were discussed with the patient as he did not remember getting the results of these labs. His fatigue is not worsening and he describes it as just not as much energy as he had in the past. He does have known anemia and has been taking a low dose of iron daily for this recently. I will check a CBC today and adjust accordingly. He has had an echo in the last year and these results were reviewed. He also had a colonoscopy in 2011 and has no GI complaints or rectal bleeding. We discussed a healthy nutrient and protein rich diet and continued exercise and iron therapy. If he is not improving by the time of his appointment with his PCP I have suggested repeat lab workup, consider sleep study,  and the possibility of formal physical therapy. If his anemia continues he may benefit from a repeat colonoscopy - I will send a message to his PCP to alert him. He did have formal physical therapy after his surgery, but did very well with this course and now has a home exercise program he follows. Patient agreed with this plan. Return precautions discussed.  Orders Placed This Encounter  Procedures  . CBC with Differential    Patient Instructions  -eat healthy meals three times per day - lots of fruits and vegetables  -continue exercising  -continue taking your iron  --We have ordered labs for your at this visit. It usually takes 1-2 weeks for these to result and be processed. We  will contact you with instructions if your results are abnormal. Normal results will be release to your [facility] in 1-2 weeks. If you have not heard from [place] or can not find your results in [facility] in 2 weeks please contact our office.  Thank you for enrolling in MyChart. Please follow the instructions below to securely access your online medical record. MyChart allows you to send messages to your doctor, view your test results, renew your prescriptions, schedule appointments, and more.  How Do I Sign Up? 1. In your Internet browser, go to [email or web address]  [email or web address] 2. Click on the New  User? link in the Sign In box.  3. Enter your MyChart Access Code exactly as it appears below. You will not need to use this code after you have completed the sign-up process. If you do not sign up before the expiration date, you must request a new code. MyChart Access Code: [email or web address] Expires: 04/08/2012  9:33 AM  4. Enter the last four digits of your Social Security Number (xxxx) and Date of Birth (mm/dd/yyyy) as indicated and click Next. You will be taken to the next sign-up page. 5. Create a MyChart ID. This will be your MyChart login ID and cannot be changed, so think of one that is secure and easy to remember. 6. Create a MyChart password. You can change your password at any time. 7. Enter your Password Reset Question and Answer and click Next. This can be used at a later time if you forget your password.  8. Select your communication preference, and if applicable enter your e-mail address. You will receive e-mail notification when new information is available in MyChart by choosing to receive e-mail notifications and filling in your e-mail. 9. Click Sign In. You can now view your medical record.   Additional Information If you have questions, you can email [email or web address] or call [phone] to talk to our MyChart staff. Remember, MyChart is NOT to be used for urgent needs.  For medical emergencies, dial 911.             Return to clinic immediately if symptoms worsen or persist or new concerns.  Return if symptoms worsen or fail to improve and as scheduled with PCP.  Kriste Basque R.

## 2012-03-10 NOTE — Progress Notes (Signed)
Quick Note:  Called and spoke with pt and pt is aware. ______ 

## 2012-04-29 ENCOUNTER — Encounter: Payer: Medicare Other | Admitting: Internal Medicine

## 2012-05-30 ENCOUNTER — Encounter: Payer: Self-pay | Admitting: Internal Medicine

## 2012-05-30 ENCOUNTER — Ambulatory Visit (INDEPENDENT_AMBULATORY_CARE_PROVIDER_SITE_OTHER): Payer: Medicare Other | Admitting: Internal Medicine

## 2012-05-30 VITALS — BP 122/72 | HR 71 | Temp 98.1°F | Wt 151.0 lb

## 2012-05-30 DIAGNOSIS — I10 Essential (primary) hypertension: Secondary | ICD-10-CM

## 2012-05-30 DIAGNOSIS — R5381 Other malaise: Secondary | ICD-10-CM

## 2012-05-30 DIAGNOSIS — E785 Hyperlipidemia, unspecified: Secondary | ICD-10-CM

## 2012-05-30 DIAGNOSIS — R0609 Other forms of dyspnea: Secondary | ICD-10-CM

## 2012-05-30 DIAGNOSIS — E119 Type 2 diabetes mellitus without complications: Secondary | ICD-10-CM

## 2012-05-30 DIAGNOSIS — R0600 Dyspnea, unspecified: Secondary | ICD-10-CM

## 2012-05-30 DIAGNOSIS — R5383 Other fatigue: Secondary | ICD-10-CM

## 2012-05-30 LAB — HEPATIC FUNCTION PANEL
AST: 24 U/L (ref 0–37)
Albumin: 4 g/dL (ref 3.5–5.2)
Alkaline Phosphatase: 46 U/L (ref 39–117)
Bilirubin, Direct: 0 mg/dL (ref 0.0–0.3)
Total Bilirubin: 0.8 mg/dL (ref 0.3–1.2)

## 2012-05-30 LAB — HEMOGLOBIN A1C: Hgb A1c MFr Bld: 6.2 % (ref 4.6–6.5)

## 2012-05-30 LAB — CBC WITH DIFFERENTIAL/PLATELET
Basophils Absolute: 0 10*3/uL (ref 0.0–0.1)
Basophils Relative: 0.3 % (ref 0.0–3.0)
Eosinophils Absolute: 0.1 10*3/uL (ref 0.0–0.7)
HCT: 37.3 % — ABNORMAL LOW (ref 39.0–52.0)
Hemoglobin: 12.4 g/dL — ABNORMAL LOW (ref 13.0–17.0)
Lymphocytes Relative: 19 % (ref 12.0–46.0)
Lymphs Abs: 1 10*3/uL (ref 0.7–4.0)
MCHC: 33.3 g/dL (ref 30.0–36.0)
Neutro Abs: 3.8 10*3/uL (ref 1.4–7.7)
RBC: 3.9 Mil/uL — ABNORMAL LOW (ref 4.22–5.81)
RDW: 13.3 % (ref 11.5–14.6)

## 2012-05-30 LAB — LIPID PANEL
HDL: 68.9 mg/dL (ref >39.00)
Total CHOL/HDL Ratio: 2
VLDL: 11.4 mg/dL (ref 0.0–40.0)

## 2012-05-30 LAB — BRAIN NATRIURETIC PEPTIDE: Pro B Natriuretic peptide (BNP): 64 pg/mL (ref 0.0–100.0)

## 2012-05-30 LAB — BASIC METABOLIC PANEL
Calcium: 8.9 mg/dL (ref 8.4–10.5)
GFR: 76.07 mL/min (ref >60.00)
Potassium: 4 mEq/L (ref 3.5–5.1)
Sodium: 139 mEq/L (ref 135–145)

## 2012-05-30 NOTE — Assessment & Plan Note (Signed)
Adequate control-= continue meds

## 2012-05-30 NOTE — Progress Notes (Signed)
Patient ID: Darius Castro, male   DOB: 02-Sep-1923, 78 y.o.   MRN: [id number] Pt comes in for CPX but has other issues We will address his medical complaints today. He describes fatigue and SOB. Feel tired in the a.m. And can't walk more than 100 feet without SOB. He is unable to exercise All of this started after surgeries in 3/13 Denies CP, PND  htn- no sxs on meds Lipids- tolerating meds  Past Medical History  Diagnosis Date  . Hyperlipidemia     takes Lipitor daily  . Adenomatous colon polyp 3/09  . Personal history of prostate cancer   . Dysphagia     with pills  . Hypertension     takes Fosinopril daily  . Sleep apnea     sleep study done in 03/30/04 in epic;doesn't use a CPAP  . Pneumonia     hx of as a child  . Arthritis     back and hip  . Dizziness     pt states when he is hot and stands up too fast  . Back pain     hx of ruptured disc  . Dry skin   . Hemorrhoid     internal   . Hx of colonic polyps   . Nocturia   . Cancer     prostate   . Impaired hearing   . 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 Diabetes mellitus     patient denies Diabetes    History   Social History  . Marital Status: Married    Spouse Name: N/A    Number of Children: N/A  . Years of Education: N/A   Occupational History  . Not on file.   Social History Main Topics  . Smoking status: Former Smoker    Quit date: 06/22/1961  . Smokeless tobacco: Never Used  . Alcohol Use: Yes     Comment: red wine 3-4 times/wk  . Drug Use: No  . Sexually Active: Not Currently   Other Topics Concern  . Not on file   Social History Narrative  . No narrative on file    Past Surgical History  Procedure Date  . Lumbar laminectomy 1998  . Knee arthroscopy 2001  . Rotator cuff repair 2/10    right  . Cataract extraction     bilateral  . Tonsillectomy     as a child  . Colonoscopy   . Radioactive seed implant 2008  . Total hip arthroplasty 08/24/2011    Procedure: TOTAL HIP  ARTHROPLASTY;  Surgeon: Nilda Simmer, MD;  Location: [facility] OR;  Service: Orthopedics;  Laterality: Right;  DR Thurston Hole WANTS 90 MINUTES FOR SURGERY  . Zenker's diverticulectomy 09/02/2011    Procedure: ZENKER'S DIVERTICULECTOMY;  Surgeon: Christia Reading, MD;  Location: [facility] OR;  Service: ENT;  Laterality: N/A;    Family History  Problem Relation Age of Onset  . Heart disease Father   . Heart attack Father   . Colon polyps Daughter   . Colon cancer Neg Hx   . Anesthesia problems Neg Hx   . Hypotension Neg Hx   . Malignant hyperthermia Neg Hx   . Pseudochol deficiency Neg Hx   . Hypertension Mother     No Known Allergies  Current Outpatient Prescriptions on File Prior to Visit  Medication Sig Dispense Refill  . Ascorbic Acid (VITAMIN C) 500 MG tablet Take 500 mg by mouth daily.        Marland Kitchen aspirin 325 MG tablet  Take 325 mg by mouth daily.      Marland Kitchen atorvastatin (LIPITOR) 40 MG tablet Take 1 tablet (40 mg total) by mouth daily.  90 tablet  3  . cholecalciferol (VITAMIN D) 1000 UNITS tablet Take 1,000 Units by mouth daily.        . ferrous sulfate 325 (65 FE) MG tablet Take 325 mg by mouth daily with breakfast.      . lisinopril (PRINIVIL,ZESTRIL) 40 MG tablet Take 40 mg by mouth daily.      . Multiple Vitamin (MULTIVITAMIN) tablet Take 1 tablet by mouth daily.      Marland Kitchen topiramate (TOPAMAX) 25 MG tablet Take 25 mg by mouth daily.      . [DISCONTINUED] fosinopril (MONOPRIL) 40 MG tablet Take 40 mg by mouth daily.       Current Facility-Administered Medications on File Prior to Visit  Medication Dose Route Frequency Provider Last Rate Last Dose  . povidone-iodine (BETADINE) 7.5 % scrub   Topical Once [employer], [place]         patient denies chest pain, shortness of breath, orthopnea. Denies lower extremity edema, abdominal pain, change in appetite, change in bowel movements. Patient denies rashes, musculoskeletal complaints. No other specific complaints in a complete review of systems except  he does admit to some diziness when he first stands  BP 122/72  Pulse 71  Temp 98.1 F (36.7 C) (Oral)  Wt 151 lb (68.493 kg)  well-developed well-nourished male in no acute distress. HEENT exam atraumatic, normocephalic, neck supple without jugular venous distention. Chest clear to auscultation cardiac exam S1-S2 are regular. Abdominal exam overweight with bowel sounds, soft and nontender. Extremities no edema. Neurologic exam is alert with a normal gait.  A/p- Fatigue- uncertain etiology- will check labs SOB- check ekg and labs

## 2012-05-30 NOTE — Assessment & Plan Note (Signed)
Check labs 

## 2012-05-31 LAB — D-DIMER, QUANTITATIVE: D-Dimer, Quant: 0.73 ug/mL-FEU — ABNORMAL HIGH (ref 0.00–0.48)

## 2012-08-03 ENCOUNTER — Telehealth: Payer: Self-pay | Admitting: Internal Medicine

## 2012-08-03 ENCOUNTER — Ambulatory Visit (INDEPENDENT_AMBULATORY_CARE_PROVIDER_SITE_OTHER): Payer: Medicare Other | Admitting: Family Medicine

## 2012-08-03 ENCOUNTER — Encounter: Payer: Self-pay | Admitting: Family Medicine

## 2012-08-03 VITALS — BP 172/90 | HR 78 | Temp 97.4°F | Wt 154.0 lb

## 2012-08-03 DIAGNOSIS — G43909 Migraine, unspecified, not intractable, without status migrainosus: Secondary | ICD-10-CM

## 2012-08-03 DIAGNOSIS — I10 Essential (primary) hypertension: Secondary | ICD-10-CM

## 2012-08-03 MED ORDER — AMLODIPINE BESYLATE 5 MG PO TABS: 5.0000 mg | ORAL_TABLET | Freq: Every day | ORAL | Status: DC

## 2012-08-03 MED ORDER — TOPIRAMATE 25 MG PO TABS: 25.0000 mg | ORAL_TABLET | Freq: Two times a day (BID) | ORAL | Status: DC

## 2012-08-03 MED ORDER — AMLODIPINE BESYLATE 2.5 MG PO TABS: 2.5000 mg | ORAL_TABLET | Freq: Every day | ORAL | Status: DC

## 2012-08-03 NOTE — Telephone Encounter (Signed)
error 

## 2012-08-03 NOTE — Progress Notes (Signed)
Chief Complaint  Patient presents with  . Migraine    nausea; x 3 weeks on and off     HPI:  Migraine: -reports diagnosed with migraines 5 years ago after a workup by [facility] and has been on topiramate since for prophylaxis -reports migraines classically present as frontal pain and visual disturbances -topiramate was working well for some time, but has had 2 migraines exactly like the ones he had in the past in the last 3 weeks -last migraine was Saturday, has switched to taking the topiramate in the morning instead of at night -no symptoms today  -he is thinking he likely needs to change his dosing of the medication  ROS: See pertinent positives and negatives per HPI.  Past Medical History  Diagnosis Date  . Hyperlipidemia     takes Lipitor daily  . Adenomatous colon polyp 3/09  . Personal history of prostate cancer   . Dysphagia     with pills  . Hypertension     takes Fosinopril daily  . Sleep apnea     sleep study done in 03/30/04 in epic;doesn't use a CPAP  . Pneumonia     hx of as a child  . Arthritis     back and hip  . Dizziness     pt states when he is hot and stands up too fast  . Back pain     hx of ruptured disc  . Dry skin   . Hemorrhoid     internal   . Hx of colonic polyps   . Nocturia   . Cancer     prostate   . Impaired hearing   . 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 Diabetes mellitus     patient denies Diabetes    Family History  Problem Relation Age of Onset  . Heart disease Father   . Heart attack Father   . Colon polyps Daughter   . Colon cancer Neg Hx   . Anesthesia problems Neg Hx   . Hypotension Neg Hx   . Malignant hyperthermia Neg Hx   . Pseudochol deficiency Neg Hx   . Hypertension Mother     History   Social History  . Marital Status: Married    Spouse Name: N/A    Number of Children: N/A  . Years of Education: N/A   Social History Main Topics  . Smoking status: Former Smoker    Quit date:  06/22/1961  . Smokeless tobacco: Never Used  . Alcohol Use: Yes     Comment: red wine 3-4 times/wk  . Drug Use: No  . Sexually Active: Not Currently   Other Topics Concern  . None   Social History Narrative  . None    Current outpatient prescriptions:ammonium lactate (LAC-HYDRIN) 12 % lotion, as needed., Disp: , Rfl: ;  Ascorbic Acid (VITAMIN C) 500 MG tablet, Take 500 mg by mouth daily.  , Disp: , Rfl: ;  aspirin 325 MG tablet, Take 325 mg by mouth daily., Disp: , Rfl: ;  atorvastatin (LIPITOR) 40 MG tablet, Take 1 tablet (40 mg total) by mouth daily., Disp: 90 tablet, Rfl: 3;  Biotin 10 MG TABS, Take 1 tablet by mouth daily., Disp: , Rfl:  cholecalciferol (VITAMIN D) 1000 UNITS tablet, Take 1,000 Units by mouth daily.  , Disp: , Rfl: ;  ferrous sulfate 325 (65 FE) MG tablet, Take 325 mg by mouth daily with breakfast., Disp: , Rfl: ;  lisinopril (PRINIVIL,ZESTRIL) 40 MG tablet, Take  40 mg by mouth daily., Disp: , Rfl: ;  Multiple Vitamin (MULTIVITAMIN) tablet, Take 1 tablet by mouth daily., Disp: , Rfl:  topiramate (TOPAMAX) 25 MG tablet, Take 1 tablet (25 mg total) by mouth 2 (two) times daily., Disp: 60 tablet, Rfl: 2;  amLODipine (NORVASC) 2.5 MG tablet, Take 1 tablet (2.5 mg total) by mouth daily., Disp: 90 tablet, Rfl: 3;  [DISCONTINUED] fosinopril (MONOPRIL) 40 MG tablet, Take 40 mg by mouth daily., Disp: , Rfl:  No current facility-administered medications for this visit. Facility-Administered Medications Ordered in Other Visits: povidone-iodine (BETADINE) 7.5 % scrub, , Topical, Once, Kirstin J Shepperson, [place]  EXAM:  Filed Vitals:   08/03/12 1003  BP: 172/90  Pulse: 78  Temp: 97.4 F (36.3 C)    Body mass index is 23.42 kg/(m^2).  GENERAL: vitals reviewed and listed above, alert, oriented, appears well hydrated and in no acute distress  HEENT: mild pupil irregularity, otherwise PERRLA, atraumatic, conjunttiva clear, no obvious abnormalities on inspection of external nose  and ears  NECK: no obvious masses on inspection  LUNGS: clear to auscultation bilaterally, no wheezes, rales or rhonchi, good air movement  CV: HRRR, no peripheral edema  MS: moves all extremities without noticeable abnormality  NEURO:  CN II-XII grossly intact, no pronator drift, finger to nose exam normal   PSYCH: pleasant and cooperative, no obvious depression or anxiety  ASSESSMENT AND PLAN:  Discussed the following assessment and plan:  1. Migraine  topiramate (TOPAMAX) 25 MG tablet   topiramate (TOPAMAX) 25 MG tablet  2. HYPERTENSION  amLODipine (NORVASC) 2.5 MG tablet   amLODipine (NORVASC) 2.5 MG tablet   DISCONTINUED: amLODipine (NORVASC) 5 MG tablet   -given mild irr of pupils on exam contacted his neurology office - reports they did an extensive workup several years ago and has visual migraines. Suspect his cataract surgery has caused the mild pupil irr which is noted on prior exam on review of chart.  -will increase his topomax and have advised he follow up with his optho this week -have advised if symptoms recur and persist, change or worsen he should be evaluated immediately -for his HTN, appears has had elevated BP on past visits, similar on recheck - will add low dose norvasc - risks discussed. Follow up for nurse visit to recheck in 2 weeks. -Patient advised to return or notify a doctor immediately if symptoms worsen or persist or new concerns arise.  Patient Instructions  -increase your topiramate to 25 mg twice daily  -follow up with your eye doctor within the next week  -follow up with your doctor at the next available appointment in 1-2 months  -if worsening symptoms, change in symptoms or persistent symptoms see a physician immediately     Terressa Koyanagi.

## 2012-08-03 NOTE — Patient Instructions (Addendum)
-  increase your topiramate to 25 mg twice daily  -follow up with your eye doctor within the next week  -follow up with your doctor at the next available appointment in 1-2 months  -if worsening symptoms, change in symptoms or persistent symptoms see a physician immediately

## 2012-08-15 ENCOUNTER — Telehealth: Payer: Self-pay | Admitting: Internal Medicine

## 2012-08-15 MED ORDER — LISINOPRIL 40 MG PO TABS: 40.0000 mg | ORAL_TABLET | Freq: Every day | ORAL | Status: DC

## 2012-08-15 NOTE — Telephone Encounter (Signed)
Patient states he need a refill of his lisinopril 40 mg 1poqd sent to [facility] for a 90 day refill. Please assist.

## 2012-08-15 NOTE — Telephone Encounter (Signed)
Sent by e-scribe. 

## 2012-08-16 ENCOUNTER — Telehealth: Payer: Self-pay | Admitting: Internal Medicine

## 2012-08-16 DIAGNOSIS — G43909 Migraine, unspecified, not intractable, without status migrainosus: Secondary | ICD-10-CM

## 2012-08-16 MED ORDER — FOSINOPRIL SODIUM 40 MG PO TABS: 40.0000 mg | ORAL_TABLET | Freq: Every day | ORAL | Status: DC

## 2012-08-16 MED ORDER — TOPIRAMATE 25 MG PO TABS: 25.0000 mg | ORAL_TABLET | Freq: Two times a day (BID) | ORAL | Status: DC

## 2012-08-16 NOTE — Telephone Encounter (Signed)
Pt called in 2/24 and thought he ordered fosinopril (MONOPRIL) 40 MG tablet.  But lisinopril was ordered.. Pt did not want this, Pt wants the  fosinopril : 90 day w/ 3 refills. Takes 1 a day. [facility].  Pt also needs topiramate (TOPAMAX) 25 MG tablet sent to [facility]. Last request sent to [facility] [address]. Pt wants refill to go mailorder next time. (Dosage was changed to 2/day) (he needed to get started). Pt needs 180 tablets/ w/ 3 refills

## 2012-08-16 NOTE — Telephone Encounter (Signed)
rx sent in electronically 

## 2012-08-19 ENCOUNTER — Encounter: Payer: Medicare Other | Admitting: Internal Medicine

## 2012-08-25 ENCOUNTER — Ambulatory Visit (INDEPENDENT_AMBULATORY_CARE_PROVIDER_SITE_OTHER): Payer: Medicare Other | Admitting: Internal Medicine

## 2012-08-25 VITALS — BP 144/76

## 2012-08-25 DIAGNOSIS — I10 Essential (primary) hypertension: Secondary | ICD-10-CM

## 2012-08-25 NOTE — Progress Notes (Signed)
Pt comes in today for Nurse visit BP check.  BP was 144/76.  Told pt to continue currents meds until Dr Cato Mulligan reviews.  Note forwarded to Dr Cato Mulligan

## 2012-09-28 ENCOUNTER — Encounter: Payer: Self-pay | Admitting: Internal Medicine

## 2012-09-28 ENCOUNTER — Ambulatory Visit (INDEPENDENT_AMBULATORY_CARE_PROVIDER_SITE_OTHER): Payer: Medicare Other | Admitting: Internal Medicine

## 2012-09-28 VITALS — BP 120/62 | HR 80 | Temp 98.9°F | Wt 152.0 lb

## 2012-09-28 DIAGNOSIS — E785 Hyperlipidemia, unspecified: Secondary | ICD-10-CM

## 2012-09-28 DIAGNOSIS — I10 Essential (primary) hypertension: Secondary | ICD-10-CM

## 2012-09-28 DIAGNOSIS — E119 Type 2 diabetes mellitus without complications: Secondary | ICD-10-CM

## 2012-09-28 DIAGNOSIS — Z8546 Personal history of malignant neoplasm of prostate: Secondary | ICD-10-CM

## 2012-09-28 NOTE — Assessment & Plan Note (Signed)
Lipid Panel     Component Value Date/Time   CHOL 155 05/30/2012 0840   TRIG 57.0 05/30/2012 0840   HDL 68.90 05/30/2012 0840   CHOLHDL 2 05/30/2012 0840   VLDL 11.4 05/30/2012 0840   LDLCALC 75 05/30/2012 0840   Controlled Continue same meds

## 2012-09-28 NOTE — Assessment & Plan Note (Signed)
Lab Results  Component Value Date   HGBA1C 6.2 05/30/2012   Previously controlled . Will check in 3 months No meds at this time

## 2012-09-28 NOTE — Progress Notes (Signed)
DM- has not required treatment  htn-- no home bps but tolerating meds  Lipids- tolerating meds  Reviewed pmh, psh, sochx   patient denies chest pain, shortness of breath, orthopnea. Denies lower extremity edema, abdominal pain, change in appetite. He does admit to one loose BM per day. Patient denies rashes, musculoskeletal complaints. No other specific complaints in a complete review of systems.    well-developed well-nourished male in no acute distress. HEENT exam atraumatic, normocephalic, neck supple without jugular venous distention. Chest clear to auscultation cardiac exam S1-S2 are regular. Abdominal exam overweight with bowel sounds, soft and nontender. Extremities no edema. Neurologic exam is alert with a normal gait.

## 2012-09-28 NOTE — Assessment & Plan Note (Signed)
No recurrence. 

## 2012-09-29 NOTE — Assessment & Plan Note (Signed)
BP Readings from Last 3 Encounters:  09/28/12 120/62  08/25/12 144/76  08/03/12 172/90   Controlled, continue meds

## 2012-10-10 ENCOUNTER — Encounter: Payer: Self-pay | Admitting: Internal Medicine

## 2012-10-10 NOTE — Addendum Note (Signed)
Addended by: Lindley Magnus on: 10/10/2012 02:38 PM   Modules accepted: Orders, SmartSet

## 2012-12-27 ENCOUNTER — Ambulatory Visit: Payer: Medicare Other | Admitting: Internal Medicine

## 2012-12-27 ENCOUNTER — Ambulatory Visit (INDEPENDENT_AMBULATORY_CARE_PROVIDER_SITE_OTHER): Payer: Medicare Other | Admitting: Internal Medicine

## 2012-12-27 ENCOUNTER — Encounter: Payer: Self-pay | Admitting: Internal Medicine

## 2012-12-27 VITALS — BP 140/80 | HR 76 | Temp 98.7°F | Wt 150.0 lb

## 2012-12-27 DIAGNOSIS — R5383 Other fatigue: Secondary | ICD-10-CM

## 2012-12-27 DIAGNOSIS — M25539 Pain in unspecified wrist: Secondary | ICD-10-CM

## 2012-12-27 DIAGNOSIS — E119 Type 2 diabetes mellitus without complications: Secondary | ICD-10-CM

## 2012-12-27 DIAGNOSIS — M25532 Pain in left wrist: Secondary | ICD-10-CM

## 2012-12-27 DIAGNOSIS — I10 Essential (primary) hypertension: Secondary | ICD-10-CM

## 2012-12-27 DIAGNOSIS — R197 Diarrhea, unspecified: Secondary | ICD-10-CM

## 2012-12-27 LAB — HEPATIC FUNCTION PANEL
ALT: 23 U/L (ref 0–53)
AST: 19 U/L (ref 0–37)
Alkaline Phosphatase: 52 U/L (ref 39–117)
Bilirubin, Direct: 0.1 mg/dL (ref 0.0–0.3)
Total Bilirubin: 0.8 mg/dL (ref 0.3–1.2)

## 2012-12-27 LAB — CBC WITH DIFFERENTIAL/PLATELET
Basophils Absolute: 0 10*3/uL (ref 0.0–0.1)
Eosinophils Absolute: 0.1 10*3/uL (ref 0.0–0.7)
Lymphocytes Relative: 14.7 % (ref 12.0–46.0)
MCHC: 33.8 g/dL (ref 30.0–36.0)
Monocytes Relative: 6.9 % (ref 3.0–12.0)
Neutrophils Relative %: 77.4 % — ABNORMAL HIGH (ref 43.0–77.0)
RBC: 3.69 Mil/uL — ABNORMAL LOW (ref 4.22–5.81)
RDW: 12.9 % (ref 11.5–14.6)

## 2012-12-27 LAB — SEDIMENTATION RATE: Sed Rate: 14 mm/hr (ref 0–22)

## 2012-12-27 LAB — BASIC METABOLIC PANEL
CO2: 24 mEq/L (ref 19–32)
Chloride: 108 mEq/L (ref 96–112)
Potassium: 4 mEq/L (ref 3.5–5.1)

## 2012-12-27 NOTE — Progress Notes (Signed)
Patient ID: Darius Castro, male   DOB: 08/12/1923, 79 y.o.   MRN: [id number]  This a.m. Left wrist pain- severe with movement  "no energy for 1 year--"since surgeries" last year (right THA and zenker's diverticulum")  Diarrhea- loose stools- only one daily but watery. No blood in stool. No abdominal pain-- duration 3 months.  Reviewed past medical history, medications, social history.  Review of systems: He admits to fatigue and no energy. No other specific complaints in a complete review of systems.  Physical exam: Review vital signs. Elderly male in no acute distress. Neck is supple. Chest clear to auscultation. Cardiac exam S1-S2 are regular. Abdominal exam active bowel sounds, soft her extremities no edema. Neurologic exam he is alert.

## 2012-12-28 ENCOUNTER — Ambulatory Visit: Payer: Medicare Other | Admitting: Internal Medicine

## 2012-12-28 DIAGNOSIS — R197 Diarrhea, unspecified: Secondary | ICD-10-CM | POA: Insufficient documentation

## 2012-12-28 NOTE — Assessment & Plan Note (Signed)
New problem. Unclear etiology. He has follow up with GI this week. I'll check routine laboratory work. We'll check C. difficile PCR.

## 2012-12-28 NOTE — Assessment & Plan Note (Signed)
I think this was documented in error. I'll remove from the problem list.

## 2012-12-28 NOTE — Assessment & Plan Note (Signed)
BP Readings from Last 3 Encounters:  12/27/12 140/80  09/28/12 120/62  08/25/12 144/76   Fair control. Continue current medications.

## 2012-12-29 ENCOUNTER — Ambulatory Visit (INDEPENDENT_AMBULATORY_CARE_PROVIDER_SITE_OTHER): Payer: Medicare Other | Admitting: Gastroenterology

## 2012-12-29 ENCOUNTER — Encounter: Payer: Self-pay | Admitting: Gastroenterology

## 2012-12-29 VITALS — BP 118/50 | HR 80 | Ht 67.25 in | Wt 149.4 lb

## 2012-12-29 DIAGNOSIS — R197 Diarrhea, unspecified: Secondary | ICD-10-CM

## 2012-12-29 NOTE — Patient Instructions (Signed)
Stay on a Lactose free diet x 10 days.   We will wait on your C.diff stool culture to do further testing.   Lactose-Free Diet Lactose is a carbohydrate that is found mainly in milk and milk products, as well as in foods with added milk or whey. Lactose must be digested by the enzyme lactase in order to be used by the body. Lactose intolerance occurs when there is a shortage of lactase. When your body is not able to digest lactose, you may feel sick to your stomach (nausea), bloated, and have cramps, gas, and diarrhea. TYPES OF LACTASE DEFICIENCY  Primary lactase deficiency. This is the most common type. It is characterized by a slow decrease in lactase activity.  Secondary lactase deficiency. This occurs following injury to the small intestinal mucosa as a result of a disease or condition. It can also occur as a result of surgery or after treatment with antibiotic medicines or cancer drugs. Tolerance to lactose varies widely. Each person must determine how much milk can be consumed without developing symptoms. Drinking smaller portions of milk throughout the day may be helpful. Some studies suggest that slowing gastric emptying may help increase tolerance of milk products. This may be done by:  Consuming milk or milk products with a meal rather than alone.  Consuming milk with a higher fat content. There are many dairy products that may be tolerated better than milk by some people, including:  Cheese (especially aged cheese). The lactose content is much lower than in milk.  Cultured dairy products, such as yogurt, buttermilk, cottage cheese, and sweet acidophilus milk (kefir). These products are usually well tolerated by lactase-deficient people. This is because the healthy bacteria help digest lactose.  Lactose-hydrolyzed milk. This product contains 40% to 90% less lactose than milk and may also be well tolerated. ADEQUACY These diets may be deficient in calcium, riboflavin, and vitamin D,  according to the Recommended Dietary Allowances of the [employer]. Depending on individual tolerances and the use of milk substitutes, milk, or other dairy products, you may be able to meet these recommendations. SPECIAL NOTES  Lactose is a carbohydrate. The main food source for lactose is dairy products. Reading food labels is important. Many products contain lactose even when they are not made from milk. Look for the following words: whey, milk solids, dry milk solids, nonfat dry milk powder. Typical sources of lactose other than dairy products include breads, candies, cold cuts, prepared and processed foods, and commercial sauces and gravies.  All foods must be prepared without milk, cream, or other dairy foods.  A vitamin or mineral supplement may be necessary. Consult your caregiver or Registered Dietitian.  Lactose is also found in many prescription and over-the-counter medicines.  Soy milk and lactose-free supplements may be used as an alternative to milk. CHOOSING FOODS Breads and Starches  Allowed: Breads and rolls made without milk. [place], [place], or [place] bread. Soda crackers, graham crackers. Any crackers prepared without lactose. Cooked or dry cereals prepared without lactose (read labels). Any potatoes, pasta, or rice prepared without milk or lactose. Popcorn.  Avoid: Breads and rolls that contain milk. Prepared mixes such as muffins, biscuits, waffles, pancakes. Sweet rolls, donuts, [place] toast (if made with milk or lactose). Zwieback crackers, corn curls, or any crackers that contain lactose. Cooked or dry cereals prepared with lactose (read labels). Instant potatoes, frozen [place] fries, scalloped or au gratin potatoes. Vegetables  Allowed: Fresh, frozen, and canned vegetables.  Avoid: Creamed or breaded vegetables.  Vegetables in a cheese sauce or with lactose-containing margarines. Fruit  Allowed: All fresh, canned, or frozen fruits that are not processed  with lactose.  Avoid: Any canned or frozen fruits processed with lactose. Meat and Meat Substitutes  Allowed: Plain beef, chicken, fish, [place], lamb, veal, pork, or ham. Kosher prepared meat products. Strained or junior meats that do not contain milk. Eggs, soy meat substitutes, nuts.  Avoid: Scrambled eggs, omelets, and souffles that contain milk. Creamed or breaded meat, fish, or fowl. Sausage products such as wieners, liver sausage, or cold cuts that contain milk solids. Cheese, cottage cheese, or cheese spreads. Milk  Allowed: None.  Avoid: Milk (whole, 2%, skim, or chocolate). Evaporated, powdered, or condensed milk. Malted milk. Soups and Combination Foods  Allowed: Bouillon, broth, vegetable soups, clear soups, consomms. Homemade soups made with allowed ingredients. Combination or prepared foods that do not contain milk or milk products (read labels).  Avoid: Cream soups, chowders, commercially prepared soups containing lactose. Macaroni and cheese, pizza. Combination or prepared foods that contain milk or milk products. Desserts and Sweets  Allowed: Water and fruit ices, gelatin, angel food cake. Homemade cookies, pies, or cakes made from allowed ingredients. Pudding (if made with water or a milk substitute). Lactose-free tofu desserts. Sugar, honey, corn syrup, jam, jelly, marmalade, molasses (beet sugar). Pure sugar candy, marshmallows.  Avoid: Ice cream, ice milk, sherbet, custard, pudding, frozen yogurt. Commercial cake and cookie mixes. Desserts that contain chocolate. Pie crust made with milk-containing margarine. Reduced calorie desserts made with a sugar substitute that contains lactose. Toffee, peppermint, butterscotch, chocolate, caramels. Fats and Oils  Allowed: Butter (as tolerated, contains very small amounts of lactose). Margarines and dressings that do not contain milk. Vegetable oils, shortening, mayonnaise, nondairy cream and whipped toppings without lactose or  milk solids added. Tomasa Blase.  Avoid: Margarines and salad dressings containing milk. Cream, cream cheese, peanut butter with added milk solids, sour cream, chip dips made with sour cream. Beverages  Allowed: Carbonated drinks, tea, coffee and freeze-dried coffee, some instant coffees (check labels). Fruit drinks, fruit and vegetable juice, rice or soy milk.  Avoid: Hot chocolate. Some cocoas, some instant coffees, instant iced teas, powdered fruit drinks (read labels). Condiments  Allowed: Soy sauce, carob powder, olives, gravy made with water, baker's cocoa, pickles, pure seasonings and spices, wine, pure monosodium glutamate, catsup, mustard.  Avoid: Some chewing gums, chocolate, some cocoas. Certain antibiotics and vitamin or mineral preparations. Spice blends if they contain milk products. MSG extender. Artificial sweeteners that contain lactose. Some nondairy creamers (read labels). SAMPLE MENU Breakfast  Orange juice.  Banana.  Bran cereal.  Nondairy creamer.  Vienna bread, toasted.  Butter or milk-free margarine.  Coffee or tea. Lunch  Chicken breast.  Rice.  Green beans.  Butter or milk-free margarine.  Fresh melon.  Coffee or tea. Dinner  [place].  Baked potato.  Butter or milk-free margarine.  Broccoli.  Lettuce salad with vinegar and oil dressing.  [employer].  Coffee or tea. Document Released: 11/28/2001 Document Revised: 08/31/2011 Document Reviewed: 09/05/2010 [facility] Patient Information 2014 [place], [place].   Thank you for choosing me and [facility].  Venita Lick. Pleas Koch., MD., Clementeen Graham

## 2012-12-29 NOTE — Progress Notes (Signed)
History of Present Illness: This is an 79 year old male who complains of intermittent loose, nonbloody stools for the past 2-3 months. He complains of severe fatigue and very poor exercise tolerance. He states he becomes so fatigue after even short walks that he needs to lay down to rest. This past week his diarrhea symptoms improved and he only has had one loose stool in the past 5 days. In fact he took Senokot laxative because he had not had a bowel movement in several days. He was evaluated by Dr. Cato Mulligan and a C. difficile PCR is pending. Recent blood work was unremarkable. His appetite is good and his weight is stable. He last underwent colonoscopy in July 2011 for followup of adenomatous colon polyps removed piecemeal. Denies weight loss, abdominal pain, constipation, change in stool caliber, melena, hematochezia, nausea, vomiting, dysphagia, reflux symptoms, chest pain.  Current Medications, Allergies, Past Medical History, Past Surgical History, Family History and Social History were reviewed in [employer] record.  Physical Exam: General: Well developed , well nourished, no acute distress Head: Normocephalic and atraumatic Eyes:  sclerae anicteric, EOMI Ears: Normal auditory acuity Mouth: No deformity or lesions Lungs: Clear throughout to auscultation Heart: Regular rate and rhythm; no murmurs, rubs or bruits Abdomen: Soft, non tender and non distended. No masses, hepatosplenomegaly or hernias noted. Normal Bowel sounds Musculoskeletal: Symmetrical with no gross deformities  Pulses:  Normal pulses noted Extremities: No clubbing, cyanosis, edema or deformities noted Neurological: Alert oriented x 4, grossly nonfocal Psychological:  Alert and cooperative. Normal mood and affect  Assessment and Recommendations:  1. Mild diarrhea. Etiology unclear. Rule out infectious etiologies. If C. difficile is negative send for all other standard stool pathogens and stool  Hemoccults. I've asked him to avoid all lactose products for 10 days and assess the response. Return office in 4 weeks.  2. Severe fatigue and poor exercise tolerance. Followup with Dr. Cato Mulligan.

## 2013-01-30 ENCOUNTER — Other Ambulatory Visit: Payer: Medicare Other

## 2013-01-30 ENCOUNTER — Encounter: Payer: Self-pay | Admitting: Gastroenterology

## 2013-01-30 ENCOUNTER — Ambulatory Visit (INDEPENDENT_AMBULATORY_CARE_PROVIDER_SITE_OTHER): Payer: Medicare Other | Admitting: Gastroenterology

## 2013-01-30 VITALS — BP 90/50 | HR 80 | Ht 67.0 in | Wt 149.1 lb

## 2013-01-30 DIAGNOSIS — R197 Diarrhea, unspecified: Secondary | ICD-10-CM

## 2013-01-30 DIAGNOSIS — R634 Abnormal weight loss: Secondary | ICD-10-CM

## 2013-01-30 NOTE — Progress Notes (Signed)
History of Present Illness: This is a 79 year old male returning for followup of intermittent diarrhea, change in bowel habits. He continues to complain of severe fatigue and poor tolerance of even minimal exertion. Recent blood work was unremarkable. C. difficile was negative.  He relates a bowel pattern where he does not have a bowel movement for 2-3 days and then he will have a looser bowel movement. Occasionally this is urgent at 4 or 5 in the morning. He's had a gradual weight loss over the past 2 years of about 15 pounds. He states his appetite is less. His most recent colonoscopy was in July 2011 showing internal hemorrhoids and a small polyp.  Current Medications, Allergies, Past Medical History, Past Surgical History, Family History and Social History were reviewed in [employer] record.  Physical Exam: General: Well developed , well nourished, no acute distress Head: Normocephalic and atraumatic Eyes:  sclerae anicteric, EOMI Ears: Normal auditory acuity Mouth: No deformity or lesions Lungs: Clear throughout to auscultation Heart: Regular rate and rhythm; no murmurs, rubs or bruits Abdomen: Soft, non tender and non distended. No masses, hepatosplenomegaly or hernias noted. Normal Bowel sounds Musculoskeletal: Symmetrical with no gross deformities  Pulses:  Normal pulses noted Extremities: No clubbing, cyanosis, edema or deformities noted Neurological: Alert oriented x 4, grossly nonfocal Psychological:  Alert and cooperative. Normal mood and affect  Assessment and Recommendations:  1. Diarrhea, change in bowel habits. Etiology unclear. Obtain stool Hemoccults and all standard stool studies for infectious pathogens. Increase dietary fiber and water intake to help regularize his bowel pattern.  2. Gradual weight loss of 15 pounds correlated with less food intake. Consider chest/abdomen/pelvic CT. Followup with Dr. Cato Mulligan.  3. Severe fatigue and poor tolerance  of almost any exertion. Followup with Dr. Cato Mulligan.

## 2013-01-30 NOTE — Patient Instructions (Addendum)
Your physician has requested that you go to the basement for the following lab work before leaving today: Stool studies.  Follow instructions on Hemoccult cards and mail them back to [place] when finished.   We have scheduled your appointment with Dr. Lesia Hausen for tomorrow 01/31/13 at 12:45pm. If you cannot make this appt please call there office to reschedule to avoid a no show fee.  Thank you for choosing me and [facility].  Venita Lick. Pleas Koch., MD., Clementeen Graham

## 2013-01-31 ENCOUNTER — Encounter: Payer: Self-pay | Admitting: Internal Medicine

## 2013-01-31 ENCOUNTER — Ambulatory Visit (INDEPENDENT_AMBULATORY_CARE_PROVIDER_SITE_OTHER): Payer: Medicare Other | Admitting: Internal Medicine

## 2013-01-31 VITALS — BP 160/90 | HR 81 | Temp 98.0°F | Resp 20 | Wt 151.0 lb

## 2013-01-31 DIAGNOSIS — R5383 Other fatigue: Secondary | ICD-10-CM

## 2013-01-31 DIAGNOSIS — R5381 Other malaise: Secondary | ICD-10-CM

## 2013-01-31 DIAGNOSIS — R197 Diarrhea, unspecified: Secondary | ICD-10-CM

## 2013-01-31 DIAGNOSIS — M1990 Unspecified osteoarthritis, unspecified site: Secondary | ICD-10-CM

## 2013-01-31 DIAGNOSIS — I10 Essential (primary) hypertension: Secondary | ICD-10-CM

## 2013-01-31 LAB — GIARDIA ANTIGEN: Giardia Screen (EIA): NEGATIVE

## 2013-01-31 MED ORDER — SERTRALINE HCL 25 MG PO TABS: 25.0000 mg | ORAL_TABLET | Freq: Every day | ORAL | Status: DC

## 2013-01-31 NOTE — Patient Instructions (Signed)
Return in one month for followup  Call or return to clinic prn if these symptoms worsen or fail to improve as anticipated.

## 2013-01-31 NOTE — Progress Notes (Signed)
Subjective:    Patient ID: Darius Castro, male    DOB: [birth year], 79 y.o.   MRN: [id number]  HPI  79 year old patient who is seen today for followup. He has a chief complaint of fatigue. He states this is the present for 2 or 3 months but review of his medical records confirms this has been present for a approaching one year. He was seen by GI yesterday for followup of diarrhea and was noted be slightly hypotensive. In the spring of last year he was treated for a health care associated pneumonia. He states it is very concerned about the poor health of his wife and admits that depression may be playing a role.  Past Medical History  Diagnosis Date  . Hyperlipidemia     takes Lipitor daily  . Adenomatous colon polyp 3/09  . Personal history of prostate cancer   . Dysphagia     with pills  . Hypertension     takes Fosinopril daily  . Sleep apnea     sleep study done in 03/30/04 in epic;doesn't use a CPAP  . Pneumonia     hx of as a child  . Arthritis     back and hip  . Dizziness     pt states when he is hot and stands up too fast  . Back pain     hx of ruptured disc  . Dry skin   . Hemorrhoid     internal   . Hx of colonic polyps   . Nocturia   . Cancer     prostate   . Impaired hearing   . 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 Diabetes mellitus     patient denies Diabetes    History   Social History  . Marital Status: Married    Spouse Name: N/A    Number of Children: 3  . Years of Education: N/A   Occupational History  . retired    Social History Main Topics  . Smoking status: Former Smoker    Quit date: 06/22/1961  . Smokeless tobacco: Never Used  . Alcohol Use: Yes     Comment: red wine 3-4 times/wk  . Drug Use: No  . Sexually Active: Not Currently   Other Topics Concern  . Not on file   Social History Narrative  . No narrative on file    Past Surgical History  Procedure Laterality Date  . Lumbar laminectomy  1998  . Knee arthroscopy   2001  . Rotator cuff repair  2/10    right  . Cataract extraction      bilateral  . Tonsillectomy      as a child  . Colonoscopy    . Radioactive seed implant  2008  . Total hip arthroplasty  08/24/2011    Procedure: TOTAL HIP ARTHROPLASTY;  Surgeon: Nilda Simmer, MD;  Location: [facility] OR;  Service: Orthopedics;  Laterality: Right;  DR Thurston Hole WANTS 90 MINUTES FOR SURGERY  . Zenker's diverticulectomy  09/02/2011    Procedure: ZENKER'S DIVERTICULECTOMY;  Surgeon: Christia Reading, MD;  Location: [facility] OR;  Service: ENT;  Laterality: N/A;    Family History  Problem Relation Age of Onset  . Heart disease Father   . Heart attack Father   . Colon polyps Daughter   . Colon cancer Neg Hx   . Anesthesia problems Neg Hx   . Hypotension Neg Hx   . Malignant hyperthermia Neg Hx   . Pseudochol deficiency Neg Hx   .  Hypertension Mother     No Known Allergies  Current Outpatient Prescriptions on File Prior to Visit  Medication Sig Dispense Refill  . amLODipine (NORVASC) 2.5 MG tablet Take 1 tablet (2.5 mg total) by mouth daily.  90 tablet  3  . ammonium lactate (LAC-HYDRIN) 12 % lotion as needed.      . Ascorbic Acid (VITAMIN C) 500 MG tablet Take 500 mg by mouth daily.        Marland Kitchen aspirin 325 MG tablet Take 325 mg by mouth daily.      Marland Kitchen atorvastatin (LIPITOR) 40 MG tablet Take 1 tablet (40 mg total) by mouth daily.  90 tablet  3  . cholecalciferol (VITAMIN D) 1000 UNITS tablet Take 1,000 Units by mouth daily.        . fosinopril (MONOPRIL) 40 MG tablet Take 1 tablet (40 mg total) by mouth daily.  90 tablet  3  . Multiple Vitamin (MULTIVITAMIN) tablet Take 1 tablet by mouth daily.      Marland Kitchen topiramate (TOPAMAX) 25 MG tablet Take 1 tablet (25 mg total) by mouth 2 (two) times daily.  180 tablet  3   Current Facility-Administered Medications on File Prior to Visit  Medication Dose Route Frequency Provider Last Rate Last Dose  . povidone-iodine (BETADINE) 7.5 % scrub   Topical Once Kirstin J Shepperson, PA-C         BP 160/90  Pulse 81  Temp(Src) 98 F (36.7 C) (Oral)  Resp 20  Wt 151 lb (68.493 kg)  BMI 23.64 kg/m2  SpO2 97%       Review of Systems  Constitutional: Positive for activity change, appetite change, fatigue and unexpected weight change. Negative for fever and chills.  HENT: Negative for hearing loss, ear pain, congestion, sore throat, trouble swallowing, neck stiffness, dental problem, voice change and tinnitus.   Eyes: Negative for pain, discharge and visual disturbance.  Respiratory: Negative for cough, chest tightness, wheezing and stridor.   Cardiovascular: Negative for chest pain, palpitations and leg swelling.  Gastrointestinal: Negative for nausea, vomiting, abdominal pain, diarrhea, constipation, blood in stool and abdominal distention.  Genitourinary: Negative for urgency, hematuria, flank pain, discharge, difficulty urinating and genital sores.  Musculoskeletal: Negative for myalgias, back pain, joint swelling, arthralgias and gait problem.  Skin: Negative for rash.  Neurological: Negative for dizziness, syncope, speech difficulty, weakness, numbness and headaches.  Hematological: Negative for adenopathy. Does not bruise/bleed easily.  Psychiatric/Behavioral: Negative for behavioral problems and dysphoric mood. The patient is not nervous/anxious.        Objective:   Physical Exam  Constitutional: He is oriented to person, place, and time. He appears well-developed.  Blood pressure 160/70 left arm Blood pressure 130/60 right arm  HENT:  Head: Normocephalic.  Right Ear: External ear normal.  Left Ear: External ear normal.  Eyes: Conjunctivae and EOM are normal.  Neck: Normal range of motion.  Cardiovascular: Normal rate and normal heart sounds.   Pulmonary/Chest: Breath sounds normal.  Abdominal: Bowel sounds are normal.  Musculoskeletal: Normal range of motion. He exhibits no edema and no tenderness.  Neurological: He is alert and oriented to person,  place, and time.  Psychiatric: He has a normal mood and affect. His behavior is normal.          Assessment & Plan:   Chronic fatigue Possible depression History of hypertension  Will place on sertraline 25 mg daily and reassess in four-week's. The patient will call if there is any clinical worsening

## 2013-02-01 LAB — OVA AND PARASITE SCREEN: OP: NONE SEEN

## 2013-02-02 ENCOUNTER — Other Ambulatory Visit (INDEPENDENT_AMBULATORY_CARE_PROVIDER_SITE_OTHER): Payer: Medicare Other

## 2013-02-02 DIAGNOSIS — R197 Diarrhea, unspecified: Secondary | ICD-10-CM

## 2013-02-02 LAB — HEMOCCULT SLIDES (X 3 CARDS)
Fecal Occult Blood: NEGATIVE
OCCULT 2: NEGATIVE
OCCULT 3: NEGATIVE
OCCULT 4: NEGATIVE

## 2013-02-07 ENCOUNTER — Encounter: Payer: Self-pay | Admitting: Gastroenterology

## 2013-02-07 MED ORDER — METRONIDAZOLE 500 MG PO TABS: 500.0000 mg | ORAL_TABLET | Freq: Two times a day (BID) | ORAL | Status: DC

## 2013-02-07 NOTE — Telephone Encounter (Signed)
FW: Non-Urgent Medical Question    Meryl Dare, MD  Darius Castro     MRN: [id number] DOB: May 21, 1924     Pt Home: [phone]     Sent: Tue February 07, 2013 3:05 PM   Entered: [phone]     To: Rossie Muskrat, RN                                 Message    He has one loose stool every few days. Make sure his is avoiding all laxatives, stool softeners, Mg supplements, diet gums, diet sodas, etc. Trial of metronidazole 500 mg po bid for 7 days for possible SB bacterial overgrowth.       See other mY Chart message

## 2013-02-17 ENCOUNTER — Other Ambulatory Visit: Payer: Self-pay | Admitting: Internal Medicine

## 2013-02-17 ENCOUNTER — Encounter: Payer: Self-pay | Admitting: Gastroenterology

## 2013-03-07 ENCOUNTER — Encounter: Payer: Self-pay | Admitting: Internal Medicine

## 2013-03-07 ENCOUNTER — Telehealth: Payer: Self-pay | Admitting: Internal Medicine

## 2013-03-07 ENCOUNTER — Ambulatory Visit (INDEPENDENT_AMBULATORY_CARE_PROVIDER_SITE_OTHER): Payer: Medicare Other | Admitting: Internal Medicine

## 2013-03-07 VITALS — BP 150/80 | HR 72 | Temp 98.6°F | Resp 18 | Wt 148.0 lb

## 2013-03-07 DIAGNOSIS — Z23 Encounter for immunization: Secondary | ICD-10-CM

## 2013-03-07 DIAGNOSIS — I10 Essential (primary) hypertension: Secondary | ICD-10-CM

## 2013-03-07 DIAGNOSIS — R5381 Other malaise: Secondary | ICD-10-CM

## 2013-03-07 DIAGNOSIS — R5382 Chronic fatigue, unspecified: Secondary | ICD-10-CM

## 2013-03-07 MED ORDER — VENLAFAXINE HCL ER 37.5 MG PO CP24: 37.5000 mg | ORAL_CAPSULE | Freq: Every day | ORAL | Status: DC

## 2013-03-07 NOTE — Telephone Encounter (Signed)
Pt called and stated that his new RX of venlafaxine XR (EFFEXOR-XR) 37.5 MG 24 hr capsule [facility]. [facility] would [facility] on [address], asap. Please assist.

## 2013-03-07 NOTE — Telephone Encounter (Signed)
done

## 2013-03-07 NOTE — Patient Instructions (Signed)
Hold atorvastatin until next office visit    It is important that you exercise regularly, at least 20 minutes 3 to 4 times per week.  If you develop chest pain or shortness of breath seek  medical attention.

## 2013-03-07 NOTE — Progress Notes (Signed)
  Subjective:    Patient ID: Darius Castro, male    DOB: [birth year], 79 y.o.   MRN: [id number]  HPI  79 year old patient who is seen today for followup. He does have a history of treated hypertension. His main complaint for one year has been fatigue. He's had a fairly extensive laboratory evaluation that has not been revealing. He was seen one month ago and placed on sertraline for possible component of depression. He feels unimproved. He states his appetite remains stable and he is sleeping well medical regimen does include atorvastatin. He did have a 2-D echocardiogram in 2012 as part of a syncopal workup which was normal  Wt Readings from Last 3 Encounters:  03/07/13 148 lb (67.132 kg)  01/31/13 151 lb (68.493 kg)  01/30/13 149 lb 2 oz (67.643 kg)    BP Readings from Last 3 Encounters:  03/07/13 150/80  01/31/13 160/90  01/30/13 90/50      Review of Systems  Constitutional: Positive for fatigue. Negative for fever, chills and appetite change.  HENT: Negative for hearing loss, ear pain, congestion, sore throat, trouble swallowing, neck stiffness, dental problem, voice change and tinnitus.   Eyes: Negative for pain, discharge and visual disturbance.  Respiratory: Positive for shortness of breath. Negative for cough, chest tightness, wheezing and stridor.   Cardiovascular: Negative for chest pain, palpitations and leg swelling.  Gastrointestinal: Negative for nausea, vomiting, abdominal pain, diarrhea, constipation, blood in stool and abdominal distention.  Genitourinary: Negative for urgency, hematuria, flank pain, discharge, difficulty urinating and genital sores.  Musculoskeletal: Negative for myalgias, back pain, joint swelling, arthralgias and gait problem.  Skin: Negative for rash.  Neurological: Positive for weakness. Negative for dizziness, syncope, speech difficulty, numbness and headaches.  Hematological: Negative for adenopathy. Does not bruise/bleed easily.   Psychiatric/Behavioral: Negative for behavioral problems and dysphoric mood. The patient is not nervous/anxious.        Objective:   Physical Exam  Constitutional: He is oriented to person, place, and time. He appears well-developed.  Repeat blood pressure 110/70  HENT:  Head: Normocephalic.  Right Ear: External ear normal.  Left Ear: External ear normal.  Eyes: Conjunctivae and EOM are normal.  Neck: Normal range of motion.  Cardiovascular: Normal rate and normal heart sounds.   No murmur heard. Full pedal pulses  Pulmonary/Chest: Breath sounds normal.  Abdominal: Bowel sounds are normal.  Musculoskeletal: Normal range of motion. He exhibits no edema and no tenderness.  Neurological: He is alert and oriented to person, place, and time.  Psychiatric: He has a normal mood and affect. His behavior is normal.          Assessment & Plan:  Chronic fatigue. Possible depression no benefit with sertraline but has only been on treatment for 4 weeks. Will discontinue and try an SNRI. Will put the Lipitor on hold for 6 weeks and reassess at that time Hypertension well controlled. Repeat blood pressure 110/70 Dyslipidemia. Hold statin therapy for 6 weeks

## 2013-03-08 ENCOUNTER — Ambulatory Visit (INDEPENDENT_AMBULATORY_CARE_PROVIDER_SITE_OTHER): Payer: Medicare Other | Admitting: Gastroenterology

## 2013-03-08 ENCOUNTER — Encounter: Payer: Self-pay | Admitting: Gastroenterology

## 2013-03-08 VITALS — BP 120/80 | HR 70 | Ht 67.0 in | Wt 146.0 lb

## 2013-03-08 DIAGNOSIS — R198 Other specified symptoms and signs involving the digestive system and abdomen: Secondary | ICD-10-CM

## 2013-03-08 NOTE — Progress Notes (Signed)
History of Present Illness: This is an 79 year old male who continues with the same bowel pattern of one slightly looser stool about every 3 days. Evaluation to date has been negative. He tried Pepto-Bismol which has firm up his stool.   Current Medications, Allergies, Past Medical History, Past Surgical History, Family History and Social History were reviewed in [employer] record.  Physical Exam: General: Well developed , well nourished, no acute distress Head: Normocephalic and atraumatic Eyes:  sclerae anicteric, EOMI Ears: Normal auditory acuity Mouth: No deformity or lesions Lungs: Clear throughout to auscultation Heart: Regular rate and rhythm; no murmurs, rubs or bruits Abdomen: Soft, non tender and non distended. No masses, hepatosplenomegaly or hernias noted. Normal Bowel sounds Musculoskeletal: Symmetrical with no gross deformities  Pulses:  Normal pulses noted Extremities: No clubbing, cyanosis, edema or deformities noted Neurological: Alert oriented x 4, grossly nonfocal Psychological:  Alert and cooperative. Normal mood and affect  Assessment and Recommendations:  1. Change in bowel habits. Kaopectate twice a day when necessary. Discontinue Pepto-Bismol.

## 2013-03-08 NOTE — Telephone Encounter (Signed)
Spoke to pt told him I can [facility] comes. Pt stated no it will cost too much money. Told pt okay just start when medication comes. Pt verbalized understanding.

## 2013-03-08 NOTE — Patient Instructions (Addendum)
Use Kaopectate as needed for diarrhea.  Thank you for choosing me and [facility].  Venita Lick. Pleas Koch., MD., Clementeen Graham

## 2013-04-20 ENCOUNTER — Ambulatory Visit: Payer: Medicare Other | Admitting: Internal Medicine

## 2013-04-21 ENCOUNTER — Ambulatory Visit: Payer: Medicare Other | Admitting: Internal Medicine

## 2013-05-31 ENCOUNTER — Ambulatory Visit (INDEPENDENT_AMBULATORY_CARE_PROVIDER_SITE_OTHER): Payer: Medicare Other | Admitting: Internal Medicine

## 2013-05-31 ENCOUNTER — Encounter: Payer: Self-pay | Admitting: Internal Medicine

## 2013-05-31 VITALS — BP 104/66 | HR 72 | Temp 98.3°F | Ht 68.0 in | Wt 146.0 lb

## 2013-05-31 DIAGNOSIS — R197 Diarrhea, unspecified: Secondary | ICD-10-CM

## 2013-05-31 DIAGNOSIS — I10 Essential (primary) hypertension: Secondary | ICD-10-CM

## 2013-05-31 MED ORDER — FOSINOPRIL SODIUM 40 MG PO TABS: 20.0000 mg | ORAL_TABLET | Freq: Every day | ORAL | Status: DC

## 2013-05-31 NOTE — Progress Notes (Signed)
Hypertension- has had a long hx of orthostatic sxs. Reviewed meds  Loose stools- 1 loose stool daily  Lipids- taking meds  Reviewed pmh, psh, meds  Ros- some fatigue, some back pain  Exam- see vitals  well-developed well-nourished male in no acute distress. HEENT exam atraumatic, normocephalic, neck supple without jugular venous distention. Chest clear to auscultation cardiac exam S1-S2 are regular. Abdominal exam overweight with bowel sounds, soft and nontender. Extremities no edema. Neurologic exam is alert with a normal gait.

## 2013-05-31 NOTE — Assessment & Plan Note (Signed)
i think overtreated i don't think his home bps are accurate (150s/90s). I repeated bp twice here and range 104-122/60 Stop amlodipine and decrease fosiniopril to 20

## 2013-05-31 NOTE — Progress Notes (Signed)
Pre visit review using our clinic review tool, if applicable. No additional management support is needed unless otherwise documented below in the visit note. 

## 2013-05-31 NOTE — Assessment & Plan Note (Signed)
?   Cause will stop as many meds as possible

## 2013-06-22 ENCOUNTER — Encounter: Payer: Self-pay | Admitting: Internal Medicine

## 2013-06-28 ENCOUNTER — Encounter: Payer: Self-pay | Admitting: Internal Medicine

## 2013-06-28 ENCOUNTER — Encounter: Payer: Medicare Other | Admitting: Internal Medicine

## 2013-06-30 ENCOUNTER — Ambulatory Visit (INDEPENDENT_AMBULATORY_CARE_PROVIDER_SITE_OTHER)
Admission: RE | Admit: 2013-06-30 | Discharge: 2013-06-30 | Disposition: A | Discharge status: Home / Self Care | Payer: Medicare Other | Source: Ambulatory Visit | Attending: Internal Medicine | Admitting: Internal Medicine

## 2013-06-30 ENCOUNTER — Encounter: Payer: Medicare Other | Admitting: Family Medicine

## 2013-06-30 ENCOUNTER — Encounter: Payer: Medicare Other | Admitting: Internal Medicine

## 2013-06-30 ENCOUNTER — Encounter: Payer: Self-pay | Admitting: Internal Medicine

## 2013-06-30 ENCOUNTER — Ambulatory Visit (INDEPENDENT_AMBULATORY_CARE_PROVIDER_SITE_OTHER): Payer: Medicare Other | Admitting: Internal Medicine

## 2013-06-30 VITALS — BP 130/62 | HR 68 | Temp 97.7°F

## 2013-06-30 DIAGNOSIS — R5381 Other malaise: Secondary | ICD-10-CM

## 2013-06-30 DIAGNOSIS — R5383 Other fatigue: Principal | ICD-10-CM

## 2013-06-30 DIAGNOSIS — R5382 Chronic fatigue, unspecified: Secondary | ICD-10-CM

## 2013-06-30 LAB — BASIC METABOLIC PANEL
BUN: 24 mg/dL — ABNORMAL HIGH (ref 6–23)
CALCIUM: 9.1 mg/dL (ref 8.4–10.5)
CHLORIDE: 107 meq/L (ref 96–112)
CO2: 26 mEq/L (ref 19–32)
CREATININE: 1.3 mg/dL (ref 0.4–1.5)
GFR: 56.41 mL/min — ABNORMAL LOW (ref >60.00)
Glucose, Bld: 79 mg/dL (ref 70–99)
Potassium: 4 mEq/L (ref 3.5–5.1)
Sodium: 140 mEq/L (ref 135–145)

## 2013-06-30 LAB — TSH: TSH: 1.49 u[IU]/mL (ref 0.35–5.50)

## 2013-06-30 LAB — SEDIMENTATION RATE: SED RATE: 9 mm/h (ref 0–22)

## 2013-06-30 LAB — CBC WITH DIFFERENTIAL/PLATELET
Basophils Absolute: 0 10*3/uL (ref 0.0–0.1)
Basophils Relative: 0.5 % (ref 0.0–3.0)
Eosinophils Absolute: 0 10*3/uL (ref 0.0–0.7)
Eosinophils Relative: 0.8 % (ref 0.0–5.0)
HCT: 35.3 % — ABNORMAL LOW (ref 39.0–52.0)
HEMOGLOBIN: 12 g/dL — AB (ref 13.0–17.0)
LYMPHS PCT: 14.4 % (ref 12.0–46.0)
Lymphs Abs: 0.7 10*3/uL (ref 0.7–4.0)
MCHC: 33.8 g/dL (ref 30.0–36.0)
MCV: 94.2 fl (ref 78.0–100.0)
MONOS PCT: 8.9 % (ref 3.0–12.0)
Monocytes Absolute: 0.4 10*3/uL (ref 0.1–1.0)
Neutro Abs: 3.6 10*3/uL (ref 1.4–7.7)
Neutrophils Relative %: 75.4 % (ref 43.0–77.0)
Platelets: 209 10*3/uL (ref 150.0–400.0)
RBC: 3.75 Mil/uL — AB (ref 4.22–5.81)
RDW: 13.8 % (ref 11.5–14.6)
WBC: 4.8 10*3/uL (ref 4.5–10.5)

## 2013-06-30 LAB — HEPATIC FUNCTION PANEL
ALT: 19 U/L (ref 0–53)
AST: 18 U/L (ref 0–37)
Albumin: 3.9 g/dL (ref 3.5–5.2)
Alkaline Phosphatase: 41 U/L (ref 39–117)
BILIRUBIN TOTAL: 0.7 mg/dL (ref 0.3–1.2)
Bilirubin, Direct: 0 mg/dL (ref 0.0–0.3)
Total Protein: 6.2 g/dL (ref 6.0–8.3)

## 2013-06-30 NOTE — Progress Notes (Signed)
error    This encounter was created in error - please disregard.

## 2013-06-30 NOTE — Progress Notes (Signed)
Pre visit review using our clinic review tool, if applicable. No additional management support is needed unless otherwise documented below in the visit note. 

## 2013-07-02 NOTE — Progress Notes (Signed)
Patient ID: Darius Castro, male   DOB: September 25, 1923, 80 y.o.   MRN: [id number]  multiple medical problems Complains of fatigue and SOB for months- PROGRESSIVE No chest pain Has occasional SOB withou PND. No chest pain  sxs have been ongoing for several months No LE edema  Reviewed pmh, meds, shx Ros_ orthostatic sxs if he stands too quickly No CP, PND, orthopnea extr- no edema  PE BP 130/62  Pulse 68  Temp(Src) 97.7 F (36.5 C) (Oral)  well-developed well-nourished male in no acute distress. HEENT exam atraumatic, normocephalic, neck supple without jugular venous distention. Chest clear to auscultation cardiac exam S1-S2 are regular. Abdominal exam overweight with bowel sounds, soft and nontender. Extremities no edema. Neurologic exam is alert with a normal gait.

## 2013-07-02 NOTE — Assessment & Plan Note (Addendum)
He needs furhter ealuation It sounds as if SOB has worsened Check ekg, labs and cxr Further recommendations after these studies have been reviewed

## 2013-07-31 ENCOUNTER — Other Ambulatory Visit: Payer: Self-pay | Admitting: Internal Medicine

## 2013-08-04 ENCOUNTER — Ambulatory Visit (INDEPENDENT_AMBULATORY_CARE_PROVIDER_SITE_OTHER): Payer: Medicare Other | Admitting: Family Medicine

## 2013-08-04 ENCOUNTER — Encounter: Payer: Self-pay | Admitting: Family Medicine

## 2013-08-04 VITALS — BP 130/80 | HR 78 | Temp 98.4°F | Wt 151.0 lb

## 2013-08-04 DIAGNOSIS — R251 Tremor, unspecified: Secondary | ICD-10-CM

## 2013-08-04 DIAGNOSIS — R259 Unspecified abnormal involuntary movements: Secondary | ICD-10-CM

## 2013-08-04 NOTE — Progress Notes (Signed)
Pre visit review using our clinic review tool, if applicable. No additional management support is needed unless otherwise documented below in the visit note. 

## 2013-08-04 NOTE — Progress Notes (Signed)
Subjective:    Patient ID: Darius Castro, male    DOB: 05-17-1924, 80 y.o.   MRN: [id number]  HPI Patient seen with chief complaint of right upper extremity tremor. Onset about 6 months ago. 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 He has had some gradual slowing of gait. He denies any involvement of his left upper extremity, head, neck, or lower extremities. No family history of tremor. No history of stroke. No recent confusion.  Question of remote history of migraine headaches. He apparently was taking Topamax for prevention until about a week ago when he stopped this on his own. Only other medication is Fosinopril for hypertension.  Regarding his tremor, occurs at rest and has not caused any impairment with fine motor tasks at this point. He ambulates without assistance. No recent falls. No clear exacerbating factors. No alleviating factors. Denies recent speech changes or any focal weakness.  Past Medical History  Diagnosis Date  . Hyperlipidemia     takes Lipitor daily  . Adenomatous colon polyp 3/09  . Personal history of prostate cancer   . Dysphagia     with pills  . Hypertension     takes Fosinopril daily  . Sleep apnea     sleep study done in 03/30/04 in epic;doesn't use a CPAP  . Pneumonia     hx of as a child  . Arthritis     back and hip  . Dizziness     pt states when he is hot and stands up too fast  . Back pain     hx of ruptured disc  . Dry skin   . Hemorrhoid     internal   . Hx of colonic polyps   . Nocturia   . Cancer     prostate   . Impaired hearing   . 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 Diabetes mellitus     patient denies Diabetes   Past Surgical History  Procedure Laterality Date  . Lumbar laminectomy  1998  . Knee arthroscopy  2001  . Rotator cuff repair  2/10    right  . Cataract extraction      bilateral  . Tonsillectomy      as a child    . Colonoscopy    . Radioactive seed implant  2008  . Total hip arthroplasty  08/24/2011    Procedure: TOTAL HIP ARTHROPLASTY;  Surgeon: Lorn Junes, MD;  Location: [place];  Service: Orthopedics;  Laterality: Right;  DR [place]  . Zenker's diverticulectomy  09/02/2011    Procedure: [id number] DIVERTICULECTOMY;  Surgeon: Melida Quitter, MD;  Location: [place];  Service: ENT;  Laterality: N/A;    reports that he quit smoking about 52 years ago. He has never used smokeless tobacco. He reports that he drinks alcohol. He reports that he does not use illicit drugs. family history includes Colon polyps in his daughter; Heart attack in his father; Heart disease in his father; Hypertension in his mother. There is no history of Colon cancer, Anesthesia problems, Hypotension, Malignant hyperthermia, or Pseudochol deficiency. No Known Allergies    Review of Systems  Constitutional: Negative for appetite change and unexpected weight change.  Respiratory: Negative for shortness of breath.   Cardiovascular: Negative for chest pain.  Neurological: Positive for tremors. Negative for dizziness, seizures, speech difficulty, weakness, light-headedness and headaches.  Psychiatric/Behavioral: Negative for confusion.  Objective:   Physical Exam  Constitutional: He is oriented to person, place, and time. He appears well-developed and well-nourished.  Neck: Neck supple.  Cardiovascular: Normal rate.   Pulmonary/Chest: Effort normal and breath sounds normal. No respiratory distress. He has no wheezes. He has no rales.  Musculoskeletal: He exhibits no edema.  Neurological: He is alert and oriented to person, place, and time. No cranial nerve deficit.  Patient has no focal weakness. He ambulates without assistance but has a relatively short gait. He has some mild tremor right upper extremity at rest and this does not clearly extinguish or improve with activity. Cerebellar function  normal  Psychiatric: He has a normal mood and affect. His behavior is normal.          Assessment & Plan:  Tremor involving right upper extremity. Not clear to me whether this represents asymmetric essential tremor versus possible Parkinson's tremor vs other. Set up neurology referral.  Tremor is not functionally limiting at this time.

## 2013-08-15 ENCOUNTER — Ambulatory Visit: Payer: Medicare Other | Admitting: Neurology

## 2013-08-23 ENCOUNTER — Encounter: Payer: Self-pay | Admitting: Family Medicine

## 2013-08-23 ENCOUNTER — Encounter: Payer: Self-pay | Admitting: Neurology

## 2013-08-23 ENCOUNTER — Telehealth: Payer: Self-pay | Admitting: Internal Medicine

## 2013-08-23 ENCOUNTER — Ambulatory Visit (INDEPENDENT_AMBULATORY_CARE_PROVIDER_SITE_OTHER): Payer: Medicare Other | Admitting: Neurology

## 2013-08-23 ENCOUNTER — Ambulatory Visit (INDEPENDENT_AMBULATORY_CARE_PROVIDER_SITE_OTHER): Payer: Medicare Other | Admitting: Family Medicine

## 2013-08-23 VITALS — BP 100/50 | HR 78 | Temp 98.6°F | Ht 68.0 in | Wt 146.0 lb

## 2013-08-23 VITALS — BP 68/42 | HR 76 | Resp 20 | Ht 68.0 in | Wt 145.4 lb

## 2013-08-23 DIAGNOSIS — I10 Essential (primary) hypertension: Secondary | ICD-10-CM

## 2013-08-23 DIAGNOSIS — I951 Orthostatic hypotension: Secondary | ICD-10-CM

## 2013-08-23 DIAGNOSIS — G43909 Migraine, unspecified, not intractable, without status migrainosus: Secondary | ICD-10-CM

## 2013-08-23 DIAGNOSIS — G20 Parkinson's disease: Secondary | ICD-10-CM

## 2013-08-23 MED ORDER — FOSINOPRIL SODIUM 20 MG PO TABS: 20.0000 mg | ORAL_TABLET | Freq: Every day | ORAL | Status: DC

## 2013-08-23 MED ORDER — CARBIDOPA-LEVODOPA 25-100 MG PO TABS: 1.0000 | ORAL_TABLET | Freq: Three times a day (TID) | ORAL | Status: DC

## 2013-08-23 NOTE — Patient Instructions (Addendum)
1.  Start carbidopa/levodopa 25/100 as follows:  1/2 tab three times a day before meals x 1 wk, then 1/2 in am & noon & 1 in evening for a week, then 1/2 in am &1 at noon &one in evening for a week, then 1 tablet three times a day before meals. We have sent this to your pharmacy.  2.  Exercise safely 3. We will refer you to [facility] who will contact you to set up physical, speech and occupational therapy. 4. We have set you up with an appt to see Dr Sarajane Jews this afternoon at 2:15 pm to discuss your blood pressure.  5. Follow up in 8 weeks.

## 2013-08-23 NOTE — Addendum Note (Signed)
Addended byMargarette Asal L on: 08/23/2013 10:10 AM   Modules accepted: Orders

## 2013-08-23 NOTE — Progress Notes (Signed)
   Subjective:    Patient ID: Darius Castro, male    DOB: [birth year], 80 y.o.   MRN: [id number]  HPI Here at the request of Dr. Doristine Devoid office to evaluate his low BP. He has been on Monopril 40 mg daily for many years, but in the past year he has developed tremors and a shuffling gait. Today he was diagnosed with Parkinsons and was started on Sinemet. His BP this morning was 68/42 and now it is 100/50. He admits to feeling fatigued, lightheaded, and weak. No chest pain or SOB. He had unremarkable labs here in January.    Review of Systems  Constitutional: Positive for fatigue.  Respiratory: Negative.   Cardiovascular: Negative.   Neurological: Positive for tremors, weakness and light-headedness. Negative for dizziness.       Objective:   Physical Exam  Constitutional: He is oriented to person, place, and time. He appears well-developed and well-nourished. No distress.  Cardiovascular: Normal rate, regular rhythm, normal heart sounds and intact distal pulses.   Pulmonary/Chest: Effort normal and breath sounds normal.  Neurological: He is alert and oriented to person, place, and time.          Assessment & Plan:  He has some orthostasis now, partly due to the parkinsons. We will decrease the Monopril to 20 mg daily, and he will follow up here in 2 weeks. We may need to cut this back even more.

## 2013-08-23 NOTE — Telephone Encounter (Signed)
Relevant patient education assigned to patient using Emmi. ° °

## 2013-08-23 NOTE — Progress Notes (Signed)
Darius Castro was seen today in the movement disorders clinic for neurologic consultation at the request of [name], MD.  The consultation is for the evaluation of tremor and to r/o ET vs PD.  This patient is accompanied in the office by his spouse who supplements the history.  Pt reports a one year hx of tremor in the RUE.  He notes it most when he is just walking or at rest.  He will sometimes notice it when using it.  He is R hand dominant.  His wife also noted   Specific Symptoms:  Tremor: yes (R hand only)  -Affected by caffeine:  no  Affected by alcohol: unknown  Affected by stress:  no  Affected by fatigue:  no  Spills soup if on spoon:  no  Spills glass of liquid if full:  no  Affects ADL's (tying shoes, brushing teeth, etc):  no Voice: hypophonic and decreased ability to understand pt Sleep: sleeps well  Vivid Dreams:  no  Acting out dreams:  no Wet Pillows: no Postural symptoms:  yes (shuffling x 1 year and getting worse)  Falls?  yes (1-2 total and last was few months ago - doesn't recall exact circumstance) Bradykinesia symptoms: shuffling gait, slow movements, difficulty getting out of a chair and difficulty regaining balance Loss of smell:  yes Loss of taste:  no Urinary Incontinence:  yes (rarely and just leakage) Difficulty Swallowing:  no Handwriting, micrographia: yes Trouble with ADL's:  no  Trouble buttoning clothing: no Depression:  no Memory changes:  no (still does driving/finances) Hallucinations:  no  visual distortions: no N/V:  no Lightheaded:  yes  Syncope: no Diplopia:  no Dyskinesia:  no  Neuroimaging has previously been performed.  MRI brain was performed in 2008 demonstrating atrophy and mild small vessel disease.  It is available for my review today.  Pt saw Dr. Brett Fairy in 2008 for diplopia and was dx with migraine and was started on topamax.  Was free of visual changes for a year and then restarted and increased med to 25 mg -  2 po bid.  Had SE with the medication (tired, dizzy) and went back on topamax 25 mg bid 2 weeks ago after bad migraine (b/l frontal, cannot describe quality, no vision change - took advil with quick relief)  PREVIOUS MEDICATIONS: none to date  ALLERGIES:  No Known Allergies  CURRENT MEDICATIONS:  Current Outpatient Prescriptions on File Prior to Visit  Medication Sig Dispense Refill  . ammonium lactate (LAC-HYDRIN) 12 % lotion as needed.      Marland Kitchen aspirin 325 MG tablet Take 325 mg by mouth daily.      . fosinopril (MONOPRIL) 40 MG tablet TAKE 1 TABLET DAILY  90 tablet  3   Current Facility-Administered Medications on File Prior to Visit  Medication Dose Route Frequency Provider Last Rate Last Dose  . povidone-iodine (BETADINE) 7.5 % scrub   Topical Once Kirstin Earl Lagos, PA-C        PAST MEDICAL HISTORY:   Past Medical History  Diagnosis Date  . Hyperlipidemia     takes Lipitor daily  . Adenomatous colon polyp 3/09  . Personal history of prostate cancer   . Dysphagia     with pills  . Hypertension     takes Fosinopril daily  . Sleep apnea     sleep study done in 03/30/04 in epic;doesn't use a CPAP  . Pneumonia     hx of as a  child  . Arthritis     back and hip  . Dizziness     pt states when he is hot and stands up too fast  . Back pain     hx of ruptured disc  . Dry skin   . Hemorrhoid     internal   . Hx of colonic polyps   . Nocturia   . Cancer     prostate   . Impaired hearing   . 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 Diabetes mellitus     patient denies Diabetes    PAST SURGICAL HISTORY:   Past Surgical History  Procedure Laterality Date  . Lumbar laminectomy  1998  . Knee arthroscopy  2001  . Rotator cuff repair  2/10    right  . Cataract extraction      bilateral  . Tonsillectomy      as a child  . Colonoscopy    . Radioactive seed implant  2008  . Total hip arthroplasty  08/24/2011    Procedure: TOTAL HIP ARTHROPLASTY;  Surgeon:  Lorn Junes, MD;  Location: [facility];  Service: Orthopedics;  Laterality: Right;  DR [place]  . Zenker's diverticulectomy  09/02/2011    Procedure: ZENKER'S DIVERTICULECTOMY;  Surgeon: Melida Quitter, MD;  Location: [facility];  Service: ENT;  Laterality: N/A;    SOCIAL HISTORY:   History   Social History  . Marital Status: Married    Spouse Name: N/A    Number of Children: 3  . Years of Education: N/A   Occupational History  . retired    Social History Main Topics  . Smoking status: Former Smoker    Quit date: 06/22/1961  . Smokeless tobacco: Never Used  . Alcohol Use: Yes     Comment: red wine 3-4 times/wk  . Drug Use: No  . Sexual Activity: Not Currently   Other Topics Concern  . Not on file   Social History Narrative  . No narrative on file    FAMILY HISTORY:   Family Status  Relation Status Death Age  . Father Deceased 47    heart attack  . Mother Deceased 43    old age, HTN  . Sister Deceased 58    cancer    ROS:  A complete 10 system review of systems was obtained and was unremarkable apart from what is mentioned above.  PHYSICAL EXAMINATION:    VITALS:   Filed Vitals:   08/23/13 0836 08/23/13 0927 08/23/13 0928  BP: 108/54 98/58 68/42   Pulse: 96 76 76  Resp: 20    Height: 5\' 8"  (1.727 m)    Weight: 145 lb 6 oz (65.942 kg)      GEN:  The patient appears stated age and is in NAD. HEENT:  Normocephalic, atraumatic.  The mucous membranes are moist. The superficial temporal arteries are without ropiness or tenderness. CV:  RRR Lungs:  CTAB Neck/HEME:  There are no carotid bruits bilaterally.  Neurological examination:  Orientation: The patient is alert and oriented x3. Fund of knowledge is appropriate.  Recent and remote memory are intact.  Attention and concentration are normal.    Able to name objects and repeat phrases. Cranial nerves: There is good facial symmetry. There is facial hypomimia.  Pupils are equal round and  minimally reactive to light bilaterally. Fundoscopic exam is attempted but the disc margins are not well visualized bilaterally. Extraocular muscles are intact. The visual fields are full to  confrontational testing. The speech is fluent and clear. There is hypophonic speech.  Soft palate rises symmetrically and there is no tongue deviation. Hearing is intact to conversational tone. Sensation: Sensation is intact to light and pinprick throughout (facial, trunk, extremities). Vibration is intact at the bilateral big toe. There is no extinction with double simultaneous stimulation. There is no sensory dermatomal level identified. Motor: Strength is 5/5 in the bilateral upper and lower extremities.   Shoulder shrug is equal and symmetric.  There is no pronator drift. Deep tendon reflexes: Deep tendon reflexes are 1/4 at the bilateral biceps, triceps, brachioradialis, patella and absent at the bilateral achilles. Plantar responses are downgoing bilaterally.  Movement examination: Tone: Has trouble relaxing to determine tone but there does appear to be more than gegenhalten and fell that there is true rigidity, R greater than L in the UE.  Abnormal movements: Rare RUE resting tremor, most prominent with ambulation. Coordination:  There is decremation with RAM's bilaterally with all forms of RAMS, including alternating supination and pronation of the forearm, hand opening and closing, finger taps, heel taps and toe taps. Gait and Station: The patient has difficulty arising out of a deep-seated chair without the use of the hands. The patient's stride length is decreased with decreased arm swing.  Turns en bloc.    ASSESSMENT/PLAN:  1.  Parkinsonism.  I suspect that this does represent idiopathic Parkinson's disease.  The patient has tremor, bradykinesia, rigidity and postural instability.  -We discussed the diagnosis as well as pathophysiology of the disease.  We discussed treatment options as well as  prognostic indicators.  Patient education was provided.  -Greater than 50% of the 80 minute visit was spent in counseling answering questions and talking about what to expect now as well as in the future.  We talked about medication options as well as potential future surgical options.  We talked about safety in the home.  We talked about the value of safe, cardiovascular exercise  -We decided to add carbidopa/levodopa 25/100.  1/2 tab tid x 1 wk, then 1/2 in am & noon & 1 at night for a week, then 1/2 in am &1 at noon &night for a week, then 1 po tid.  Risks, benefits, side effects and alternative therapies were discussed.  The opportunity to ask questions was given and they were answered to the best of my ability.  The patient expressed understanding and willingness to follow the outlined treatment protocols.  -I will refer the patient [facility], for PT/OT and ST.  We talked about the importance of cardiovascular exercise in Parkinson's disease. 2.  Significant, symptommatic orthostatic hypotension  -His BP dropped to 60's/40's when standing.  Called PCP and he is unavailable but office kindly worked the pt in this afternoon.  Feel that [place] likely due to PD but wonder if he still needs the fosinopril (or if dose can at least be decreased). 3.  Hx of migraine  -Don't care for the topamax. Pt recently stopped and got a migraine and wants to continue for now.  Hoping to d/c in the future. 4.  Follow up 8 weeks.

## 2013-08-23 NOTE — Progress Notes (Signed)
Pre visit review using our clinic review tool, if applicable. No additional management support is needed unless otherwise documented below in the visit note. 

## 2013-08-29 ENCOUNTER — Telehealth: Payer: Self-pay | Admitting: Neurology

## 2013-08-29 NOTE — Telephone Encounter (Signed)
Called and spoke with Judeen Hammans at [facility] who confirmed with the clinical manager that patient is scheduled to be seen by PT/OT and ST. She is not sure who called and said that ST wasn't available, but state that information is incorrect.

## 2013-08-29 NOTE — Telephone Encounter (Signed)
Orders recv'd for PT for Parkinson's program, pt has started this and will be seen 2x per week x 5 week. OT will also visit pt. [facility] does not have ST available, this will need to go elsewhere. / Gayleen Orem,

## 2013-08-31 ENCOUNTER — Telehealth: Payer: Self-pay | Admitting: Neurology

## 2013-08-31 DIAGNOSIS — G20 Parkinson's disease: Secondary | ICD-10-CM

## 2013-08-31 NOTE — Telephone Encounter (Signed)
Claude Manges from [facility] called wanting to inform Dr. Carles Collet that the pt is not homebound so [facility] is going to discharge him and they are going to refer him to out patient therapy.   Her c/b [phone]

## 2013-08-31 NOTE — Telephone Encounter (Signed)
FYI

## 2013-08-31 NOTE — Telephone Encounter (Signed)
Darius Castro, will you see me about this one?  Thanks!

## 2013-09-01 NOTE — Addendum Note (Signed)
Addended byAnnamaria Helling on: 09/01/2013 02:20 PM   Modules accepted: Orders

## 2013-09-01 NOTE — Telephone Encounter (Signed)
Spoke with patient and he is okay going to outpatient physical therapy. Referral made to [facility].

## 2013-09-05 ENCOUNTER — Encounter: Payer: Self-pay | Admitting: Internal Medicine

## 2013-09-05 ENCOUNTER — Ambulatory Visit (INDEPENDENT_AMBULATORY_CARE_PROVIDER_SITE_OTHER): Payer: Medicare Other | Admitting: Internal Medicine

## 2013-09-05 VITALS — BP 148/80 | HR 80 | Temp 98.5°F | Ht 68.0 in | Wt 150.0 lb

## 2013-09-05 DIAGNOSIS — I10 Essential (primary) hypertension: Secondary | ICD-10-CM

## 2013-09-05 DIAGNOSIS — G20 Parkinson's disease: Secondary | ICD-10-CM

## 2013-09-05 MED ORDER — CALCIUM POLYCARBOPHIL 625 MG PO TABS: 1250.0000 mg | ORAL_TABLET | Freq: Two times a day (BID) | ORAL | Status: DC

## 2013-09-05 NOTE — Progress Notes (Signed)
Pre visit review using our clinic review tool, if applicable. No additional management support is needed unless otherwise documented below in the visit note. 

## 2013-09-05 NOTE — Progress Notes (Signed)
htn- tolerating meds- home bps generally 130s/70s. BP has been as high 190/98.  Has been as low as 117/60  BMs- still loose. He thinks decreasing topirimate helped some.   Ros: no other specific complaints   Reviewed vitals Exam: chest cta cv- reg rate abd- soft, nontender Neuro- shuffling gait, no arm swing

## 2013-09-05 NOTE — Assessment & Plan Note (Signed)
There is some variabitlity He has had previous trouble with orthostasis so my inclincation is to let BP run higher than i might typically.  Anything less than 150/90 without orthostatic sxs should be fine.

## 2013-09-11 ENCOUNTER — Ambulatory Visit: Payer: Medicare Other | Admitting: Internal Medicine

## 2013-09-14 ENCOUNTER — Encounter (HOSPITAL_COMMUNITY): Payer: Self-pay | Admitting: Emergency Medicine

## 2013-09-14 ENCOUNTER — Emergency Department (HOSPITAL_COMMUNITY): Payer: Medicare Other

## 2013-09-14 ENCOUNTER — Emergency Department (HOSPITAL_COMMUNITY)
Admission: EM | Admit: 2013-09-14 | Discharge: 2013-09-14 | Disposition: A | Discharge status: Home / Self Care | Payer: Medicare Other | Attending: Emergency Medicine | Admitting: Emergency Medicine

## 2013-09-14 DIAGNOSIS — IMO0002 Reserved for concepts with insufficient information to code with codable children: Secondary | ICD-10-CM | POA: Insufficient documentation

## 2013-09-14 DIAGNOSIS — G20 Parkinson's disease: Secondary | ICD-10-CM | POA: Insufficient documentation

## 2013-09-14 DIAGNOSIS — E785 Hyperlipidemia, unspecified: Secondary | ICD-10-CM | POA: Insufficient documentation

## 2013-09-14 DIAGNOSIS — Z860100 Personal history of colon polyps, unspecified: Secondary | ICD-10-CM | POA: Insufficient documentation

## 2013-09-14 DIAGNOSIS — Y929 Unspecified place or not applicable: Secondary | ICD-10-CM | POA: Insufficient documentation

## 2013-09-14 DIAGNOSIS — Z8669 Personal history of other diseases of the nervous system and sense organs: Secondary | ICD-10-CM | POA: Insufficient documentation

## 2013-09-14 DIAGNOSIS — Y939 Activity, unspecified: Secondary | ICD-10-CM | POA: Insufficient documentation

## 2013-09-14 DIAGNOSIS — S022XXB Fracture of nasal bones, initial encounter for open fracture: Secondary | ICD-10-CM | POA: Insufficient documentation

## 2013-09-14 DIAGNOSIS — W19XXXA Unspecified fall, initial encounter: Secondary | ICD-10-CM

## 2013-09-14 DIAGNOSIS — W010XXA Fall on same level from slipping, tripping and stumbling without subsequent striking against object, initial encounter: Secondary | ICD-10-CM | POA: Insufficient documentation

## 2013-09-14 DIAGNOSIS — S0120XA Unspecified open wound of nose, initial encounter: Secondary | ICD-10-CM | POA: Insufficient documentation

## 2013-09-14 DIAGNOSIS — M1610 Unilateral primary osteoarthritis, unspecified hip: Secondary | ICD-10-CM | POA: Insufficient documentation

## 2013-09-14 DIAGNOSIS — Z79899 Other long term (current) drug therapy: Secondary | ICD-10-CM | POA: Insufficient documentation

## 2013-09-14 DIAGNOSIS — M47817 Spondylosis without myelopathy or radiculopathy, lumbosacral region: Secondary | ICD-10-CM | POA: Insufficient documentation

## 2013-09-14 DIAGNOSIS — Z872 Personal history of diseases of the skin and subcutaneous tissue: Secondary | ICD-10-CM | POA: Insufficient documentation

## 2013-09-14 DIAGNOSIS — Z8719 Personal history of other diseases of the digestive system: Secondary | ICD-10-CM | POA: Insufficient documentation

## 2013-09-14 DIAGNOSIS — G20A1 Parkinson's disease without dyskinesia, without mention of fluctuations: Secondary | ICD-10-CM | POA: Insufficient documentation

## 2013-09-14 DIAGNOSIS — S46909A Unspecified injury of unspecified muscle, fascia and tendon at shoulder and upper arm level, unspecified arm, initial encounter: Secondary | ICD-10-CM | POA: Insufficient documentation

## 2013-09-14 DIAGNOSIS — S4980XA Other specified injuries of shoulder and upper arm, unspecified arm, initial encounter: Secondary | ICD-10-CM | POA: Insufficient documentation

## 2013-09-14 DIAGNOSIS — E119 Type 2 diabetes mellitus without complications: Secondary | ICD-10-CM | POA: Insufficient documentation

## 2013-09-14 DIAGNOSIS — Z8546 Personal history of malignant neoplasm of prostate: Secondary | ICD-10-CM | POA: Insufficient documentation

## 2013-09-14 DIAGNOSIS — Z8701 Personal history of pneumonia (recurrent): Secondary | ICD-10-CM | POA: Insufficient documentation

## 2013-09-14 DIAGNOSIS — Z7982 Long term (current) use of aspirin: Secondary | ICD-10-CM | POA: Insufficient documentation

## 2013-09-14 DIAGNOSIS — I10 Essential (primary) hypertension: Secondary | ICD-10-CM | POA: Insufficient documentation

## 2013-09-14 DIAGNOSIS — Z87891 Personal history of nicotine dependence: Secondary | ICD-10-CM | POA: Insufficient documentation

## 2013-09-14 LAB — URINALYSIS, ROUTINE W REFLEX MICROSCOPIC
BILIRUBIN URINE: NEGATIVE
Glucose, UA: NEGATIVE mg/dL
HGB URINE DIPSTICK: NEGATIVE
Ketones, ur: NEGATIVE mg/dL
Leukocytes, UA: NEGATIVE
Nitrite: NEGATIVE
PH: 7.5 (ref 5.0–8.0)
Protein, ur: NEGATIVE mg/dL
Specific Gravity, Urine: 1.008 (ref 1.005–1.030)
Urobilinogen, UA: 0.2 mg/dL (ref 0.0–1.0)

## 2013-09-14 LAB — CBC WITH DIFFERENTIAL/PLATELET
Basophils Absolute: 0 10*3/uL (ref 0.0–0.1)
Basophils Relative: 0 % (ref 0–1)
Eosinophils Absolute: 0 10*3/uL (ref 0.0–0.7)
Eosinophils Relative: 0 % (ref 0–5)
HEMATOCRIT: 36.2 % — AB (ref 39.0–52.0)
HEMOGLOBIN: 12.3 g/dL — AB (ref 13.0–17.0)
Lymphocytes Relative: 16 % (ref 12–46)
Lymphs Abs: 1.2 10*3/uL (ref 0.7–4.0)
MCH: 31.5 pg (ref 26.0–34.0)
MCHC: 34 g/dL (ref 30.0–36.0)
MCV: 92.8 fL (ref 78.0–100.0)
MONO ABS: 0.5 10*3/uL (ref 0.1–1.0)
MONOS PCT: 6 % (ref 3–12)
NEUTROS ABS: 5.5 10*3/uL (ref 1.7–7.7)
Neutrophils Relative %: 77 % (ref 43–77)
Platelets: 245 10*3/uL (ref 150–400)
RBC: 3.9 MIL/uL — ABNORMAL LOW (ref 4.22–5.81)
RDW: 13.1 % (ref 11.5–15.5)
WBC: 7.2 10*3/uL (ref 4.0–10.5)

## 2013-09-14 LAB — BASIC METABOLIC PANEL
BUN: 18 mg/dL (ref 6–23)
CHLORIDE: 103 meq/L (ref 96–112)
CO2: 25 mEq/L (ref 19–32)
Calcium: 9.4 mg/dL (ref 8.4–10.5)
Creatinine, Ser: 1.02 mg/dL (ref 0.50–1.35)
GFR calc Af Amer: 74 mL/min — ABNORMAL LOW (ref >90)
GFR calc non Af Amer: 64 mL/min — ABNORMAL LOW (ref >90)
Glucose, Bld: 111 mg/dL — ABNORMAL HIGH (ref 70–99)
Potassium: 3.9 mEq/L (ref 3.7–5.3)
Sodium: 141 mEq/L (ref 137–147)

## 2013-09-14 MED ORDER — CEPHALEXIN 500 MG PO CAPS: 500.0000 mg | ORAL_CAPSULE | Freq: Four times a day (QID) | ORAL | Status: DC

## 2013-09-14 MED ORDER — ACETAMINOPHEN 325 MG PO TABS
650.0000 mg | ORAL_TABLET | Freq: Once | ORAL | Status: AC
  Administered 2013-09-14: 650 mg via ORAL
  Filled 2013-09-14: qty 2

## 2013-09-14 NOTE — ED Provider Notes (Signed)
CSN: [id number]     Arrival date & time 09/14/13  1403 History   First MD Initiated Contact with Patient 09/14/13 1455     Chief Complaint  Patient presents with  . Fall  . Shoulder Pain     (Consider location/radiation/quality/duration/timing/severity/associated sxs/prior Treatment) HPI  This is an 80 year old male with a history of hyperlipidemia, hypertension, and Parkinson's disease who presents following a fall. Patient reports that he thinks he tripped and fell on carpeted flooring. He remembers falling. He is unsure if he passed out. He reports pain over his face and left shoulder. He has not been ambulatory sent. He does take an aspirin daily.  Patient denies any fevers, chest pain, shortness of breath, urinary symptoms.  Past Medical History  Diagnosis Date  . Hyperlipidemia     takes Lipitor daily  . Adenomatous colon polyp 3/09  . Personal history of prostate cancer   . Dysphagia     with pills  . Hypertension     takes Fosinopril daily  . Sleep apnea     sleep study done in 03/30/04 in epic;doesn't use a CPAP  . Pneumonia     hx of as a child  . Arthritis     back and hip  . Dizziness     pt states when he is hot and stands up too fast  . Back pain     hx of ruptured disc  . Dry skin   . Hemorrhoid     internal   . Hx of colonic polyps   . Nocturia   . Cancer     prostate   . Impaired hearing   . 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 Diabetes mellitus     patient denies Diabetes   Past Surgical History  Procedure Laterality Date  . Lumbar laminectomy  1998  . Knee arthroscopy  2001  . Rotator cuff repair  2/10    right  . Cataract extraction      bilateral  . Tonsillectomy      as a child  . Colonoscopy    . Radioactive seed implant  2008  . Total hip arthroplasty  08/24/2011    Procedure: TOTAL HIP ARTHROPLASTY;  Surgeon: Lorn Junes, MD;  Location: [place] [facility];  Service: Orthopedics;  Laterality: Right;  DR [place]  . Zenker's diverticulectomy  09/02/2011    Procedure: ZENKER'S DIVERTICULECTOMY;  Surgeon: Melida Quitter, MD;  Location: [facility] OR;  Service: ENT;  Laterality: N/A;   Family History  Problem Relation Age of Onset  . Heart disease Father   . Heart attack Father   . Colon polyps Daughter   . Colon cancer Neg Hx   . Anesthesia problems Neg Hx   . Hypotension Neg Hx   . Malignant hyperthermia Neg Hx   . Pseudochol deficiency Neg Hx   . Hypertension Mother    History  Substance Use Topics  . Smoking status: Former Smoker    Quit date: 06/22/1961  . Smokeless tobacco: Never Used  . Alcohol Use: Yes     Comment: red wine 3-4 times/wk    Review of Systems  Constitutional: Negative.  Negative for fever.  HENT:       Nasal swelling, laceration  Respiratory: Negative.  Negative for chest tightness and shortness of breath.   Cardiovascular: Negative.  Negative for chest pain.  Gastrointestinal: Negative.  Negative for abdominal pain.  Genitourinary: Negative.  Negative for dysuria.  Musculoskeletal:  Positive for gait problem. Negative for back pain.  Skin: Positive for wound. Negative for rash.  Neurological: Negative for syncope and headaches.  All other systems reviewed and are negative.      Allergies  Review of patient's allergies indicates no known allergies.  Home Medications   Current Outpatient Rx  Name  Route  Sig  Dispense  Refill  . aspirin 325 MG tablet   Oral   Take 325 mg by mouth daily.         . fosinopril (MONOPRIL) 20 MG tablet   Oral   Take 20 mg by mouth at bedtime.         . topiramate (TOPAMAX) 25 MG tablet   Oral   Take 25 mg by mouth daily.          . cephALEXin (KEFLEX) 500 MG capsule   Oral   Take 1 capsule (500 mg total) by mouth 4 (four) times daily.   20 capsule   0    BP 176/84  Pulse 74  Temp(Src) 98.2 F (36.8 C) (Oral)  Resp 18  Ht 5\' 8"  (1.727 m)  Wt 154 lb (69.854 kg)  BMI 23.42 kg/m2  SpO2 97% Physical Exam   Nursing note and vitals reviewed. Constitutional: He is oriented to person, place, and time.  Elderly  HENT:  Right Ear: External ear normal.  Left Ear: External ear normal.  Mouth/Throat: Oropharynx is clear and moist.  Laceration over the bridge of the nose, mild amount of swelling over the bridge of the nose   Eyes: EOM are normal. Pupils are equal, round, and reactive to light.  Neck: Normal range of motion. Neck supple.  No midline cervical tenderness  Cardiovascular: Normal rate, regular rhythm and normal heart sounds.   No murmur heard. Pulmonary/Chest: Effort normal and breath sounds normal. No respiratory distress. He has no wheezes.  Abdominal: Soft. Bowel sounds are normal. There is no tenderness. There is no rebound.  Musculoskeletal: He exhibits no edema.  Tenderness palpation over the left deltoid without obvious deformity, abrasions noted over the third and fourth digits, Normal range of motion of the bilateral hips and bilateral knees, no obvious deformity or foreshortening  Lymphadenopathy:    He has no cervical adenopathy.  Neurological: He is alert and oriented to person, place, and time.  Skin: Skin is warm and dry.  Psychiatric: He has a normal mood and affect.    ED Course  Procedures (including critical care time) Labs Review Labs Reviewed  CBC WITH DIFFERENTIAL - Abnormal; Notable for the following:    RBC 3.90 (*)    Hemoglobin 12.3 (*)    HCT 36.2 (*)    All other components within normal limits  BASIC METABOLIC PANEL - Abnormal; Notable for the following:    Glucose, Bld 111 (*)    GFR calc non Af Amer 64 (*)    GFR calc Af Amer 74 (*)    All other components within normal limits  URINALYSIS, ROUTINE W REFLEX MICROSCOPIC   Imaging Review Ct Head Wo Contrast  09/14/2013   CLINICAL DATA:  Status post fall without loss of consciousness, nasal laceration  EXAM: CT HEAD WITHOUT CONTRAST  CT MAXILLOFACIAL WITHOUT CONTRAST  CT CERVICAL SPINE WITHOUT  CONTRAST  TECHNIQUE: Multidetector CT imaging of the head, cervical spine, and maxillofacial structures were performed using the standard protocol without intravenous contrast. Multiplanar CT image reconstructions of the cervical spine and maxillofacial structures were also generated.  COMPARISON:  None.  FINDINGS: CT HEAD FINDINGS  There is mild diffuse cerebral and cerebellar atrophy with compensatory ventriculomegaly. There is no intracranial hemorrhage nor intracranial mass effect. There is no evidence of an evolving ischemic infarction. The cerebellum and brainstem are normal in density.  At bone window settings there are likely a retention cyst or chronic mucoperiosteal thickening in the maxillary sinuses. There are no air-fluid levels. There is no evidence of an acute skull fracture.  CT MAXILLOFACIAL FINDINGS  There is a tiny depressed fracture through the tip of the nasal bone. The nasal septum and nasal spine are intact. The bony orbits and the walls of the paranasal sinuses are intact. The zygomatic arches are intact. There is mucoperiosteal thickening which is likely chronic in both maxillary sinuses. The pterygoid plates are intact. The temporomandibular joints demonstrate degenerative change but no acute fracture. There is no acute mandibular fracture.  The orbital soft tissues exhibit no acute abnormalities. There is no significant pre or postseptal edema.  CT CERVICAL SPINE FINDINGS  The cervical vertebral bodies are preserved in height. There is disc space narrowing at C5-6 and C6-7 with prominent anterior endplate osteophytes. There are small endplate osteophytes elsewhere, and there is calcification of the posterior aspect of the C2-3 and C3-4 discs. There is degenerative change of the atlanto-dens articulation with associated soft tissue calcification. No acute odontoid fracture is demonstrated. The lateral masses of C1 align normally with those of C2. There are degenerative facet joint changes  at multiple levels. There is no perched facet nor spinous process fracture. The bony ring at each cervical level is intact. The observed portions of the first and second ribs exhibit no acute abnormalities.  IMPRESSION: 1. There are mild age related atrophic changes within the cerebrum and cerebellum. There is no evidence of an acute ischemic or hemorrhagic event or other acute intracranial abnormality. There is no evidence of an acute skull fracture. 2. There is a tiny depressed fracture through the tip of the nasal bone. No other facial bone fracture is demonstrated. 3. There is significant degenerative change at multiple disc levels of the cervical spine including the C1-C2 articulation. There is no evidence of an acute fracture nor dislocation.   Electronically Signed   By: David  Martinique   On: 09/14/2013 16:23   Ct Cervical Spine Wo Contrast  09/14/2013   CLINICAL DATA:  Status post fall without loss of consciousness, nasal laceration  EXAM: CT HEAD WITHOUT CONTRAST  CT MAXILLOFACIAL WITHOUT CONTRAST  CT CERVICAL SPINE WITHOUT CONTRAST  TECHNIQUE: Multidetector CT imaging of the head, cervical spine, and maxillofacial structures were performed using the standard protocol without intravenous contrast. Multiplanar CT image reconstructions of the cervical spine and maxillofacial structures were also generated.  COMPARISON:  None.  FINDINGS: CT HEAD FINDINGS  There is mild diffuse cerebral and cerebellar atrophy with compensatory ventriculomegaly. There is no intracranial hemorrhage nor intracranial mass effect. There is no evidence of an evolving ischemic infarction. The cerebellum and brainstem are normal in density.  At bone window settings there are likely a retention cyst or chronic mucoperiosteal thickening in the maxillary sinuses. There are no air-fluid levels. There is no evidence of an acute skull fracture.  CT MAXILLOFACIAL FINDINGS  There is a tiny depressed fracture through the tip of the nasal bone.  The nasal septum and nasal spine are intact. The bony orbits and the walls of the paranasal sinuses are intact. The zygomatic arches are intact. There is mucoperiosteal thickening which is likely  chronic in both maxillary sinuses. The pterygoid plates are intact. The temporomandibular joints demonstrate degenerative change but no acute fracture. There is no acute mandibular fracture.  The orbital soft tissues exhibit no acute abnormalities. There is no significant pre or postseptal edema.  CT CERVICAL SPINE FINDINGS  The cervical vertebral bodies are preserved in height. There is disc space narrowing at C5-6 and C6-7 with prominent anterior endplate osteophytes. There are small endplate osteophytes elsewhere, and there is calcification of the posterior aspect of the C2-3 and C3-4 discs. There is degenerative change of the atlanto-dens articulation with associated soft tissue calcification. No acute odontoid fracture is demonstrated. The lateral masses of C1 align normally with those of C2. There are degenerative facet joint changes at multiple levels. There is no perched facet nor spinous process fracture. The bony ring at each cervical level is intact. The observed portions of the first and second ribs exhibit no acute abnormalities.  IMPRESSION: 1. There are mild age related atrophic changes within the cerebrum and cerebellum. There is no evidence of an acute ischemic or hemorrhagic event or other acute intracranial abnormality. There is no evidence of an acute skull fracture. 2. There is a tiny depressed fracture through the tip of the nasal bone. No other facial bone fracture is demonstrated. 3. There is significant degenerative change at multiple disc levels of the cervical spine including the C1-C2 articulation. There is no evidence of an acute fracture nor dislocation.   Electronically Signed   By: David  Martinique   On: 09/14/2013 16:23   Dg Shoulder Left  09/14/2013   CLINICAL DATA:  Fall today. Pain in the  anterior left shoulder. Unable to abduct arm for the axillary view.  EXAM: LEFT SHOULDER - 2+ VIEW  COMPARISON:  Chest x-ray on 06/30/2013  FINDINGS: There is spurring of the acromion. Subacromial narrowing is present. There is mild degenerative change at the glenohumeral joint. No evidence for acute fracture or traumatic subluxation. Lung apex is clear.  IMPRESSION: Chronic changes. Subacromial narrowing can be associated with rotator cuff injury.   Electronically Signed   By: Shon Hale M.D.   On: 09/14/2013 16:20   Dg Hand Complete Left  09/14/2013   CLINICAL DATA:  Pain status post trauma  EXAM: LEFT HAND - COMPLETE 3+ VIEW  COMPARISON:  None.  FINDINGS: There is no evidence of fracture or dislocation. Joint space narrowing, periarticular sclerosis, peripheral hypertrophic spurring appreciated within the proximal and distal interphalangeal joints, second, third and fourth carpophalangeal articulations, first carpal metacarpal articulation and to a lesser extent the radial carpal articulation.  IMPRESSION: No evidence of acute osseous abnormalities. Multifocal areas of osteoarthritis.   Electronically Signed   By: Margaree Mackintosh M.D.   On: 09/14/2013 16:28   Ct Maxillofacial Wo Cm  09/14/2013   CLINICAL DATA:  Status post fall without loss of consciousness, nasal laceration  EXAM: CT HEAD WITHOUT CONTRAST  CT MAXILLOFACIAL WITHOUT CONTRAST  CT CERVICAL SPINE WITHOUT CONTRAST  TECHNIQUE: Multidetector CT imaging of the head, cervical spine, and maxillofacial structures were performed using the standard protocol without intravenous contrast. Multiplanar CT image reconstructions of the cervical spine and maxillofacial structures were also generated.  COMPARISON:  None.  FINDINGS: CT HEAD FINDINGS  There is mild diffuse cerebral and cerebellar atrophy with compensatory ventriculomegaly. There is no intracranial hemorrhage nor intracranial mass effect. There is no evidence of an evolving ischemic infarction.  The cerebellum and brainstem are normal in density.  At bone window settings  there are likely a retention cyst or chronic mucoperiosteal thickening in the maxillary sinuses. There are no air-fluid levels. There is no evidence of an acute skull fracture.  CT MAXILLOFACIAL FINDINGS  There is a tiny depressed fracture through the tip of the nasal bone. The nasal septum and nasal spine are intact. The bony orbits and the walls of the paranasal sinuses are intact. The zygomatic arches are intact. There is mucoperiosteal thickening which is likely chronic in both maxillary sinuses. The pterygoid plates are intact. The temporomandibular joints demonstrate degenerative change but no acute fracture. There is no acute mandibular fracture.  The orbital soft tissues exhibit no acute abnormalities. There is no significant pre or postseptal edema.  CT CERVICAL SPINE FINDINGS  The cervical vertebral bodies are preserved in height. There is disc space narrowing at C5-6 and C6-7 with prominent anterior endplate osteophytes. There are small endplate osteophytes elsewhere, and there is calcification of the posterior aspect of the C2-3 and C3-4 discs. There is degenerative change of the atlanto-dens articulation with associated soft tissue calcification. No acute odontoid fracture is demonstrated. The lateral masses of C1 align normally with those of C2. There are degenerative facet joint changes at multiple levels. There is no perched facet nor spinous process fracture. The bony ring at each cervical level is intact. The observed portions of the first and second ribs exhibit no acute abnormalities.  IMPRESSION: 1. There are mild age related atrophic changes within the cerebrum and cerebellum. There is no evidence of an acute ischemic or hemorrhagic event or other acute intracranial abnormality. There is no evidence of an acute skull fracture. 2. There is a tiny depressed fracture through the tip of the nasal bone. No other facial bone  fracture is demonstrated. 3. There is significant degenerative change at multiple disc levels of the cervical spine including the C1-C2 articulation. There is no evidence of an acute fracture nor dislocation.   Electronically Signed   By: David  Martinique   On: 09/14/2013 16:23     EKG Interpretation   Date/Time:  Thursday September 14 2013 16:17:05 EDT Ventricular Rate:  80 PR Interval:  196 QRS Duration: 94 QT Interval:  399 QTC Calculation: 460 R Axis:   51 Text Interpretation:  Sinus rhythm Atrial premature complex Borderline T  wave abnormalities No significant change since last tracing Confirmed by  Phylisha Dix  MD, Loma Sousa ([id number]) on 09/14/2013 5:23:46 PM     LACERATION REPAIR Performed by: Thayer Jew, F Authorized by: Thayer Jew, F Consent: Verbal consent obtained. Risks and benefits: risks, benefits and alternatives were discussed Consent given by: patient Patient identity confirmed: provided demographic data Prepped and Draped in normal sterile fashion Wound explored  Laceration Location: nose  Laceration Length: 1.5cm  No Foreign Bodies seen or palpated   Irrigation method: syringe Amount of cleaning: standard  Skin closure: dermabond and steristrips   Patient tolerance: Patient tolerated the procedure well with no immediate complications.  MDM   Final diagnoses:  Open fracture of nasal bone  Fall    Patient presents following a fall. Evidence of laceration over the bridge of the nose, otherwise benign exam. Vital signs are reassuring. Likely mechanical fall as patient has a history of Parkinson's and reports feeling unstable on the carpet. However cannot deny loss of consciousness. Syncope workup is reassuring. Orthostatics negative. EKG shows no evidence of arrhythmia. Patient is not anemic. CT head, neck, and face obtained and shows a nasal bone fracture. This is likely just underneath the  laceration. Laceration with Dermabond at the bedside. Will place  patient on Keflex for 5 days given that it is an open fracture. Patient was able to a bili prior to discharge.  After history, exam, and medical workup I feel the patient has been appropriately medically screened and is safe for discharge home. Pertinent diagnoses were discussed with the patient. Patient was given return precautions.     Merryl Hacker, MD 09/14/13 [phone]

## 2013-09-14 NOTE — ED Notes (Signed)
Patient in radiology- cannot do EKG or vitals

## 2013-09-14 NOTE — ED Notes (Signed)
Bed: WA04 Expected date:  Expected time:  Means of arrival:  Comments: [id number] yo, fall and laceration

## 2013-09-14 NOTE — Progress Notes (Signed)
CSW met with patient and family at bedside.  Patient and family are not currently certain of the physician's plans for care.  Patient and family agree once he is medically cleared he will return to [facility].        Chesley Noon, MSW, [place], 09/14/2013 Evening Clinical Social Worker [phone]

## 2013-09-14 NOTE — Discharge Instructions (Signed)
Nasal Fracture A nasal fracture is a break or crack in the bones of the nose. A minor break usually heals in a month. You often will receive black eyes from a nasal fracture. This is not a cause for concern. The black eyes will go away over 1 to 2 weeks.  DIAGNOSIS  Your caregiver may want to examine you if you are concerned about a fracture of the nose. X-rays of the nose may not show a nasal fracture even when one is present. Sometimes your caregiver must wait 1 to 5 days after the injury to re-check the nose for alignment and to take additional X-rays. Sometimes the caregiver must wait until the swelling has gone down. TREATMENT Minor fractures that have caused no deformity often do not require treatment. More serious fractures where bones are displaced may require surgery. This will take place after the swelling is gone. Surgery will stabilize and align the fracture. HOME CARE INSTRUCTIONS   Put ice on the injured area.  Put ice in a plastic bag.  Place a towel between your skin and the bag.  Leave the ice on for 15-20 minutes, 03-04 times a day.  Take medications as directed by your caregiver.  Only take over-the-counter or prescription medicines for pain, discomfort, or fever as directed by your caregiver.  If your nose starts bleeding, squeeze the soft parts of the nose against the center wall while you are sitting in an upright position for 10 minutes.  Contact sports should be avoided for at least 3 to 4 weeks or as directed by your caregiver. SEEK MEDICAL CARE IF:  Your pain increases or becomes severe.  You continue to have nosebleeds.  The shape of your nose does not return to normal within 5 days.  You have pus draining from the nose. SEEK IMMEDIATE MEDICAL CARE IF:   You have bleeding from your nose that does not stop after 20 minutes of pinching the nostrils closed and keeping ice on the nose.  You have clear fluid draining from your nose.  You notice a grape-like  swelling on the dividing wall between the nostrils (septum). This is a collection of blood (hematoma) that must be drained to help prevent infection.  You have difficulty moving your eyes.  You have recurrent vomiting. Document Released: 06/05/2000 Document Revised: 08/31/2011 Document Reviewed: 09/22/2010 [facility] Patient Information 2014 [place].

## 2013-09-14 NOTE — ED Notes (Signed)
Ambulated in the hallway without difficulty. Patient denied any dizziness.

## 2013-09-14 NOTE — ED Notes (Signed)
Pt alert, arrives from assisted living, [facility] [place], c/o fall from standing, landed on carpet, denies LOC, laceration to nose, resp even unlabored, skin pwd

## 2013-09-15 ENCOUNTER — Telehealth: Payer: Self-pay | Admitting: Internal Medicine

## 2013-09-15 NOTE — Telephone Encounter (Signed)
appt scheduled for Monday 3/30 at 8 am

## 2013-09-15 NOTE — Telephone Encounter (Signed)
Pt states he fell yesterday due to dizziness. Pt broke his nose. Hospital advised to see dr swords next week. Pt states dr swords is working w/ him to get vertigo under control.  Pt would like appt.to see him.

## 2013-09-18 ENCOUNTER — Encounter: Payer: Self-pay | Admitting: Internal Medicine

## 2013-09-18 ENCOUNTER — Ambulatory Visit (INDEPENDENT_AMBULATORY_CARE_PROVIDER_SITE_OTHER): Payer: Medicare Other | Admitting: Internal Medicine

## 2013-09-18 VITALS — BP 104/60 | Temp 97.9°F | Ht 68.0 in | Wt 150.0 lb

## 2013-09-18 DIAGNOSIS — Z9181 History of falling: Secondary | ICD-10-CM

## 2013-09-18 DIAGNOSIS — G20 Parkinson's disease: Secondary | ICD-10-CM

## 2013-09-18 DIAGNOSIS — G20A1 Parkinson's disease without dyskinesia, without mention of fluctuations: Secondary | ICD-10-CM

## 2013-09-18 DIAGNOSIS — I10 Essential (primary) hypertension: Secondary | ICD-10-CM

## 2013-09-18 NOTE — Patient Instructions (Signed)
Stop fosinopril

## 2013-09-18 NOTE — Progress Notes (Signed)
Pre visit review using our clinic review tool, if applicable. No additional management support is needed unless otherwise documented below in the visit note. 

## 2013-09-18 NOTE — Progress Notes (Signed)
F/u ED- Reviewed note- He states that he gets dizzy frequently- prior to fall he was quite dizzy- and then fell without loss of consciousness.  He has parkinsons and htn. Currently on sinemet and fosinopril.  Reviewed ed notes, labs and imaging Past medical history, medications, family history, social history  Review of systems: He really denies head pain. He denies lightheadedness today. No other specific complaints.  Reviewed vital signs HEENT exam: Roosting over nose and eyes. Chest clear to auscultation Cardiac exam S1-S2 are regular Extremities: No edema

## 2013-09-19 ENCOUNTER — Telehealth: Payer: Self-pay | Admitting: Internal Medicine

## 2013-09-19 DIAGNOSIS — K225 Diverticulum of esophagus, acquired: Secondary | ICD-10-CM

## 2013-09-19 NOTE — Telephone Encounter (Signed)
Daughter states home health request an PT order speech therapy for pt. Daughter would like to know when dr swords wants to see pt again, she would like to know in advance b/c she comes from [place]. daughter states the pt referral in there now will not cover speech therapy per home health nurse.Marland Kitchen

## 2013-09-20 NOTE — Telephone Encounter (Signed)
Ok per Dr Leanne Chang Pts daughter will call back tomorrow and give [place] fax number so we can fax orders over.  Told her to ask for [facility]

## 2013-09-21 NOTE — Telephone Encounter (Signed)
Order faxed.

## 2013-09-22 NOTE — Assessment & Plan Note (Signed)
I long discussion with the patient and his family members. I think his best to let his blood pressures run high. He'll monitor this at home. He'll call me if his blood pressures are over 180/90.

## 2013-09-22 NOTE — Assessment & Plan Note (Signed)
I think his fall was multifactorial. I suspect Parkinson's disease plays a role. I also suspect Parkinson's disease is contributing to orthostatic hypotension as are his medications. This was discussed with the patient. I'm going to refer to physical therapy for gait training. I'll also eliminate fosinopril.

## 2013-09-25 ENCOUNTER — Telehealth: Payer: Self-pay

## 2013-09-25 NOTE — Telephone Encounter (Signed)
Pt's PT called and stated that pt was instructed to discontinue bp medication.  Pt c/o of increased dizziness, pt's bp is stable at 127/67, and he has no heart palpitations.  Pt wants to know if he dizziness is due to lack of bp med

## 2013-09-26 NOTE — Telephone Encounter (Signed)
Pt.notified

## 2013-09-26 NOTE — Telephone Encounter (Signed)
No, his dizziness is likely caused by a low blood pressure (a "blood pressure medication" would make that worse).

## 2013-09-28 ENCOUNTER — Telehealth: Payer: Self-pay | Admitting: *Deleted

## 2013-09-28 NOTE — Telephone Encounter (Signed)
[name], PT with [facility] is at pts home and he stated that Darius Castro is still experiencing a lot of dizziness and wanted to know if this could be related to his medicines and/or if this is a chronic issue with him.  Told Darius Castro that this is a chronic issue with Darius Castro and I thought that Dr Leanne Chang had reason to believe it was from the topriamate.  Darius Castro would like to know if Dr Leanne Chang could advise him on what the next step would be and if he needed to do anything on his on end.  He stated that Darius Castro had d/c the lisinopril.  Please advise

## 2013-10-02 ENCOUNTER — Telehealth: Payer: Self-pay | Admitting: Neurology

## 2013-10-02 ENCOUNTER — Telehealth: Payer: Self-pay | Admitting: Internal Medicine

## 2013-10-02 NOTE — Telephone Encounter (Signed)
This is a complicated problem. I think it is related to Darius Castro diagnosis of Parkinson's disease and also multifactorial with medications. I would advise that Darius Castro discussed this with his neurologist.

## 2013-10-02 NOTE — Telephone Encounter (Signed)
Daughter would like to advise dr that pt is still dizzy. Going off the BP med 2 wks ago is not what is making him dizzy., She states this has been going on 2 yrs.  She thinks it could be his migraine med which on the bottle states it could cause dizziness.topiramate (TOPAMAX) 25 MG tablet Pt stays dizzy all the time. Even when he goes to exercise, he has to sit down cause he's dizzy.  daughter states pt needs help asap!

## 2013-10-02 NOTE — Telephone Encounter (Signed)
Left message for pt to call back  °

## 2013-10-02 NOTE — Telephone Encounter (Signed)
Pt aware, he will contact his neurologist.

## 2013-10-02 NOTE — Telephone Encounter (Signed)
Error

## 2013-10-03 ENCOUNTER — Ambulatory Visit (INDEPENDENT_AMBULATORY_CARE_PROVIDER_SITE_OTHER): Payer: Medicare Other | Admitting: Neurology

## 2013-10-03 ENCOUNTER — Encounter: Payer: Self-pay | Admitting: Neurology

## 2013-10-03 VITALS — BP 112/58 | HR 80 | Ht 68.0 in | Wt 146.0 lb

## 2013-10-03 DIAGNOSIS — G20 Parkinson's disease: Secondary | ICD-10-CM

## 2013-10-03 DIAGNOSIS — I951 Orthostatic hypotension: Secondary | ICD-10-CM

## 2013-10-03 MED ORDER — FLUDROCORTISONE ACETATE 0.1 MG PO TABS: 0.1000 mg | ORAL_TABLET | Freq: Every day | ORAL | Status: DC

## 2013-10-03 NOTE — Telephone Encounter (Signed)
Per Dr Leanne Chang previous message pt needs to f/u with neurologist.  Called daughter and told daughter to call neurologist.  She verbalized understanding and had no questions

## 2013-10-03 NOTE — Progress Notes (Signed)
Darius Castro was seen today in the movement disorders clinic for neurologic consultation at the request of [name], MD.  The consultation is for the evaluation of tremor and to r/o ET vs PD.  This patient is accompanied in the office by his spouse who supplements the history.  Pt reports a one year hx of tremor in the RUE.  He notes it most when he is just walking or at rest.  He will sometimes notice it when using it.  He is R hand dominant.  His wife also noted   Specific Symptoms:  Tremor: yes (R hand only)  -Affected by caffeine:  no  Affected by alcohol: unknown  Affected by stress:  no  Affected by fatigue:  no  Spills soup if on spoon:  no  Spills glass of liquid if full:  no  Affects ADL's (tying shoes, brushing teeth, etc):  no Voice: hypophonic and decreased ability to understand pt Sleep: sleeps well  Vivid Dreams:  no  Acting out dreams:  no Wet Pillows: no Postural symptoms:  yes (shuffling x 1 year and getting worse)  Falls?  yes (1-2 total and last was few months ago - doesn't recall exact circumstance) Bradykinesia symptoms: shuffling gait, slow movements, difficulty getting out of a chair and difficulty regaining balance Loss of smell:  yes Loss of taste:  no Urinary Incontinence:  yes (rarely and just leakage) Difficulty Swallowing:  no Handwriting, micrographia: yes Trouble with ADL's:  no  Trouble buttoning clothing: no Depression:  no Memory changes:  no (still does driving/finances) Hallucinations:  no  visual distortions: no N/V:  no Lightheaded:  yes  Syncope: no Diplopia:  no Dyskinesia:  no  Neuroimaging has previously been performed.  MRI brain was performed in 2008 demonstrating atrophy and mild small vessel disease.  It is available for my review today.  Pt saw Dr. Brett Fairy in 2008 for diplopia and was dx with migraine and was started on topamax.  Was free of visual changes for a year and then restarted and increased med to 25 mg -  2 po bid.  Had SE with the medication (tired, dizzy) and went back on topamax 25 mg bid 2 weeks ago after bad migraine (b/l frontal, cannot describe quality, no vision change - took advil with quick relief)  10/03/13: The patient is following up in Parkinson's disease, which was just diagnosed on 08/23/2013.  This patient is accompanied in the office by his spouse who supplements the history.  At that time I started him on levodopa. Pt is not sure the carbidopa/levodopa 25/100 is helping.  I did review notes available to me since last visit.  After our last visit, he immediately went to his primary care physician because of orthostatic hypotension.  His Monopril was decreased.  He went to the emergency room on 09/14/2013.  He had fallen after becoming dizzy.  There was no associated loss of consciousness.  He did end up fracturing his nasal bone.  In the end, his blood pressure medicine was discontinued.  Despite this, he continued to have dizziness that has been disabling. Pt feels rather insistent that this is not from the levodopa, as it has been going on for 2 years.  Occasional diplopia, lasting 3-4 minutes.  Is always with the dizziness.    PREVIOUS MEDICATIONS: none to date  ALLERGIES:  No Known Allergies  CURRENT MEDICATIONS:  Current Outpatient Prescriptions on File Prior to Visit  Medication Sig Dispense Refill  .  aspirin 325 MG tablet Take 325 mg by mouth daily.      . carbidopa-levodopa (SINEMET IR) 25-100 MG per tablet Take 1 tablet by mouth 3 (three) times daily.      . polycarbophil (FIBERCON) 625 MG tablet Take 625 mg by mouth daily.      Marland Kitchen topiramate (TOPAMAX) 25 MG tablet Take 25 mg by mouth daily.        Current Facility-Administered Medications on File Prior to Visit  Medication Dose Route Frequency Provider Last Rate Last Dose  . povidone-iodine (BETADINE) 7.5 % scrub   Topical Once Kirstin Earl [place], PA-C        PAST MEDICAL HISTORY:   Past Medical History  Diagnosis  Date  . Hyperlipidemia     takes Lipitor daily  . Adenomatous colon polyp 3/09  . Personal history of prostate cancer   . Dysphagia     with pills  . Hypertension     takes Fosinopril daily  . Sleep apnea     sleep study done in 03/30/04 in epic;doesn't use a CPAP  . Pneumonia     hx of as a child  . Arthritis     back and hip  . Dizziness     pt states when he is hot and stands up too fast  . Back pain     hx of ruptured disc  . Dry skin   . Hemorrhoid     internal   . Hx of colonic polyps   . Nocturia   . Cancer     prostate   . Impaired hearing   . 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 Diabetes mellitus     patient denies Diabetes    PAST SURGICAL HISTORY:   Past Surgical History  Procedure Laterality Date  . Lumbar laminectomy  1998  . Knee arthroscopy  2001  . Rotator cuff repair  2/10    right  . Cataract extraction      bilateral  . Tonsillectomy      as a child  . Colonoscopy    . Radioactive seed implant  2008  . Total hip arthroplasty  08/24/2011    Procedure: TOTAL HIP ARTHROPLASTY;  Surgeon: Lorn Junes, MD;  Location: [facility];  Service: Orthopedics;  Laterality: Right;  DR [place]  . Zenker's diverticulectomy  09/02/2011    Procedure: ZENKER'S DIVERTICULECTOMY;  Surgeon: Melida Quitter, MD;  Location: [facility];  Service: ENT;  Laterality: N/A;    SOCIAL HISTORY:   History   Social History  . Marital Status: Married    Spouse Name: N/A    Number of Children: 3  . Years of Education: N/A   Occupational History  . retired    Social History Main Topics  . Smoking status: Former Smoker    Quit date: 06/22/1961  . Smokeless tobacco: Never Used  . Alcohol Use: Yes     Comment: red wine 3-4 times/wk  . Drug Use: No  . Sexual Activity: Not Currently   Other Topics Concern  . Not on file   Social History Narrative  . No narrative on file    FAMILY HISTORY:   Family Status  Relation Status Death Age  .  Father Deceased 26    heart attack  . Mother Deceased 69    old age, HTN  . Sister Deceased 40    cancer    ROS:  A complete 10 system review of  systems was obtained and was unremarkable apart from what is mentioned above.  PHYSICAL EXAMINATION:    VITALS:   Filed Vitals:   10/03/13 0841 10/03/13 0842  BP: 104/52 88/50  Pulse: 76 92  Height: 5\' 8"  (1.727 m)   Weight: 146 lb (66.225 kg)     GEN:  The patient appears stated age and is in NAD. HEENT:  Normocephalic, atraumatic.  The mucous membranes are moist. The superficial temporal arteries are without ropiness or tenderness. CV:  RRR Lungs:  CTAB Neck/HEME:  There are no carotid bruits bilaterally.  Neurological examination:  Orientation: The patient is alert and oriented x3. Fund of knowledge is appropriate.  Recent and remote memory are intact.  Attention and concentration are normal.    Able to name objects and repeat phrases.  Very good historian. Cranial nerves: There is good facial symmetry. There is facial hypomimia.  Pupils are equal round and minimally reactive to light bilaterally. Fundoscopic exam is attempted but the disc margins are not well visualized bilaterally. Extraocular muscles are intact. The visual fields are full to confrontational testing. The speech is fluent and clear. There is hypophonic speech.  Soft palate rises symmetrically and there is no tongue deviation. Hearing is intact to conversational tone. Sensation: Sensation is intact to light and pinprick throughout (facial, trunk, extremities). Vibration is intact at the bilateral big toe. There is no extinction with double simultaneous stimulation. There is no sensory dermatomal level identified. Motor: Strength is 5/5 in the bilateral upper and lower extremities.   Shoulder shrug is equal and symmetric.  There is no pronator drift.   Movement examination: Tone: Has trouble relaxing to determine tone but there does appear to be more than gegenhalten  and fell that there is true rigidity, R greater than L in the UE.  Abnormal movements: Rare RUE resting tremor, most prominent with ambulation. Coordination:  There is decremation with RAM's, especially on the L Gait and Station: The patient has difficulty arising out of a deep-seated chair without the use of the hands. The patient's stride length is markedly improved.  ASSESSMENT/PLAN:  1.  Parkinsonism.  I suspect that this does represent idiopathic Parkinson's disease.  The patient has tremor, bradykinesia, rigidity and postural instability.  One of the atypical states cannot be ruled out given the [facility] that seems fairly early on in the diagnosis, but given hx wonder if PD wasn't going on longer and just not dx?  -dont think that the levodopa really exacerbating OH as BP actually little better today than last visit (b/c off monopril).  Will cautiously continue carbidopa/levodopa 25/100 tid.  -[facility], for PT/OT and ST.   2.  Significant, symptommatic orthostatic hypotension  -He continues to have OH despite the fact that he is off of monopril.   Will start florinef after a long and detailed discussion of r/b/se with patient and wife. He expressed understanding.  He is to stop in office for BP/orthostatic checks.  Start 0.1 mg daily.  Risks, benefits, side effects and alternative therapies were discussed.  The opportunity to ask questions was given and they were answered to the best of my ability.  The patient expressed understanding and willingness to follow the outlined treatment protocols.  -Will ask cardiology to eval given that I am starting florinef  -If florinef no help, will consider droxidopa. 3.  Hx of migraine  -Don't care for the topamax. Pt recently stopped and got a migraine and  wants to continue for now.  Hoping to d/c in the future.  Dont think that dizziness related to topamax. 4.  Follow up 8 weeks.

## 2013-10-03 NOTE — Patient Instructions (Signed)
1. Start Florinef - this prescription has been sent to your pharmacy.  2. Come by the office to have orthostatics done in one week and four weeks from now.  3. We have made you an appt with cardiology to see Dr Meda Coffee on 10/18/13 at 8:00 am to arrive at 7:45 am. Please call [phone] if this is not a good date/time.  4. Follow up 8 weeks.

## 2013-10-18 ENCOUNTER — Ambulatory Visit: Payer: Medicare Other | Admitting: Neurology

## 2013-10-18 ENCOUNTER — Encounter: Payer: Medicare Other | Admitting: Cardiology

## 2013-10-24 ENCOUNTER — Ambulatory Visit: Payer: Medicare Other | Admitting: Internal Medicine

## 2013-10-27 ENCOUNTER — Encounter: Payer: Self-pay | Admitting: *Deleted

## 2013-10-31 ENCOUNTER — Encounter: Payer: Self-pay | Admitting: Cardiology

## 2013-10-31 ENCOUNTER — Ambulatory Visit (INDEPENDENT_AMBULATORY_CARE_PROVIDER_SITE_OTHER): Payer: Medicare Other | Admitting: Cardiology

## 2013-10-31 VITALS — BP 104/55 | HR 72 | Ht 68.0 in | Wt 149.0 lb

## 2013-10-31 DIAGNOSIS — E785 Hyperlipidemia, unspecified: Secondary | ICD-10-CM

## 2013-10-31 DIAGNOSIS — I951 Orthostatic hypotension: Secondary | ICD-10-CM

## 2013-10-31 DIAGNOSIS — I10 Essential (primary) hypertension: Secondary | ICD-10-CM

## 2013-10-31 LAB — CBC WITH DIFFERENTIAL/PLATELET
Basophils Absolute: 0 10*3/uL (ref 0.0–0.1)
Basophils Relative: 0.3 % (ref 0.0–3.0)
Eosinophils Absolute: 0 10*3/uL (ref 0.0–0.7)
Eosinophils Relative: 0.3 % (ref 0.0–5.0)
HCT: 34.6 % — ABNORMAL LOW (ref 39.0–52.0)
Hemoglobin: 11.5 g/dL — ABNORMAL LOW (ref 13.0–17.0)
Lymphocytes Relative: 14.2 % (ref 12.0–46.0)
Lymphs Abs: 0.9 10*3/uL (ref 0.7–4.0)
MCHC: 33.3 g/dL (ref 30.0–36.0)
MCV: 96.6 fl (ref 78.0–100.0)
Monocytes Absolute: 0.5 10*3/uL (ref 0.1–1.0)
Monocytes Relative: 7 % (ref 3.0–12.0)
Neutro Abs: 5.2 10*3/uL (ref 1.4–7.7)
Neutrophils Relative %: 78.2 % — ABNORMAL HIGH (ref 43.0–77.0)
Platelets: 235 10*3/uL (ref 150.0–400.0)
RBC: 3.58 Mil/uL — ABNORMAL LOW (ref 4.22–5.81)
RDW: 14.2 % (ref 11.5–15.5)
WBC: 6.7 10*3/uL (ref 4.0–10.5)

## 2013-10-31 LAB — COMPREHENSIVE METABOLIC PANEL
ALT: 11 U/L (ref 0–53)
AST: 19 U/L (ref 0–37)
Albumin: 3.8 g/dL (ref 3.5–5.2)
Alkaline Phosphatase: 42 U/L (ref 39–117)
BUN: 19 mg/dL (ref 6–23)
CO2: 30 mEq/L (ref 19–32)
Calcium: 9 mg/dL (ref 8.4–10.5)
Chloride: 102 mEq/L (ref 96–112)
Creatinine, Ser: 1 mg/dL (ref 0.4–1.5)
GFR: 75.82 mL/min (ref >60.00)
Glucose, Bld: 97 mg/dL (ref 70–99)
Potassium: 3.3 mEq/L — ABNORMAL LOW (ref 3.5–5.1)
Sodium: 140 mEq/L (ref 135–145)
Total Bilirubin: 0.8 mg/dL (ref 0.2–1.2)
Total Protein: 6 g/dL (ref 6.0–8.3)

## 2013-10-31 LAB — TSH: TSH: 1.29 u[IU]/mL (ref 0.35–4.50)

## 2013-10-31 MED ORDER — METOPROLOL TARTRATE 25 MG PO TABS: 25.0000 mg | ORAL_TABLET | Freq: Two times a day (BID) | ORAL | Status: DC

## 2013-10-31 NOTE — Progress Notes (Signed)
Patient ID: DAMICHAEL HOFMAN, male   DOB: 06/05/1924, 80 y.o.   MRN: [id number]     Patient Name: Darius Castro Date of Encounter: 10/31/2013  Primary Care Provider:  [name], MD Primary Cardiologist:  Dorothy Spark (previously Dr Verl Blalock)   Problem List   Past Medical History  Diagnosis Date  . Hyperlipidemia     takes Lipitor daily  . Adenomatous colon polyp 3/09  . Personal history of prostate cancer   . Dysphagia     with pills  . Hypertension     takes Fosinopril daily  . Sleep apnea     sleep study done in 03/30/04 in epic;doesn't use a CPAP  . Pneumonia     hx of as a child  . Arthritis     back and hip  . Dizziness     pt states when he is hot and stands up too fast  . Back pain     hx of ruptured disc  . Dry skin   . Hemorrhoid     internal   . Hx of colonic polyps   . Nocturia   . Prostate cancer   . Impaired hearing   . 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 Diabetes mellitus     patient denies Diabetes   Past Surgical History  Procedure Laterality Date  . Lumbar laminectomy  1998  . Knee arthroscopy  2001  . Rotator cuff repair Right 2/10  . Cataract extraction Bilateral   . Tonsillectomy      as a child  . Colonoscopy    . Radioactive seed implant  2008  . Total hip arthroplasty  08/24/2011    Procedure: TOTAL HIP ARTHROPLASTY;  Surgeon: Lorn Junes, MD;  Location: [facility];  Service: Orthopedics;  Laterality: Right;  DR [place]  . Zenker's diverticulectomy  09/02/2011    Procedure: ZENKER'S DIVERTICULECTOMY;  Surgeon: Melida Quitter, MD;  Location: [facility] OR;  Service: ENT;  Laterality: N/A;    Allergies  No Known Allergies  HPI  A 80 year old male with h/o hyperlipidemia, hypertension who is coming with the main concern of dizziness on a daily basis especially when he stands up too fast. Going off the BP med 2 wks ago is not what is making him dizzy, he states this has been going on 2 yrs, they think it  could be his migraine med which on the bottle states it could cause dizziness topiramate.  Pt stays dizzy all the time. Even when he goes to exercise, he has to sit down cause he's dizzy.   He reports no falls, no palpitations, chest pain or shortness of breath. No syncope.  He is currently taking fludrocortisone 0.1 mg daily.  Orthostatics today: Lying: 181/83, HR 73 Sitting: 152/79, HR 75 Standing: 104/55, HR 73   Home Medications  Prior to Admission medications   Medication Sig Start Date End Date Taking? Authorizing Provider  aspirin 325 MG tablet Take 325 mg by mouth daily.   Yes Historical Provider, MD  carbidopa-levodopa (SINEMET IR) 25-100 MG per tablet Take 1 tablet by mouth 3 (three) times daily. 08/23/13  Yes Historical Provider, MD  fludrocortisone (FLORINEF) 0.1 MG tablet Take 1 tablet (0.1 mg total) by mouth daily. 10/03/13  Yes Rebecca S Tat, DO  fosinopril (MONOPRIL) 20 MG tablet  08/23/13  Yes Historical Provider, MD  polycarbophil (FIBERCON) 625 MG tablet Take 625 mg by mouth daily.   Yes Historical  Provider, MD  topiramate (TOPAMAX) 25 MG tablet Take 25 mg by mouth daily.    Yes Historical Provider, MD    Family History  Family History  Problem Relation Age of Onset  . Heart disease Father   . Heart attack Father 14  . Colon polyps Daughter   . Colon cancer Neg Hx   . Anesthesia problems Neg Hx   . Hypotension Neg Hx   . Malignant hyperthermia Neg Hx   . Pseudochol deficiency Neg Hx   . Hypertension Mother   . Cancer Sister     Social History  History   Social History  . Marital Status: Married    Spouse Name: N/A    Number of Children: 3  . Years of Education: N/A   Occupational History  . retired    Social History Main Topics  . Smoking status: Former Smoker    Quit date: 06/22/1961  . Smokeless tobacco: Never Used  . Alcohol Use: Yes     Comment: red wine 3-4 times/wk  . Drug Use: No  . Sexual Activity: Not Currently   Other Topics Concern    . Not on file   Social History Narrative  . No narrative on file     Review of Systems, as per HPI, otherwise negative General:  No chills, fever, night sweats or weight changes.  Cardiovascular:  No chest pain, dyspnea on exertion, edema, orthopnea, palpitations, paroxysmal nocturnal dyspnea. Dermatological: No rash, lesions/masses Respiratory: No cough, dyspnea Urologic: No hematuria, dysuria Abdominal:   No nausea, vomiting, diarrhea, bright red blood per rectum, melena, or hematemesis Neurologic:  No visual changes, wkns, changes in mental status. All other systems reviewed and are otherwise negative except as noted above.  Physical Exam  Blood pressure 104/55, pulse 72, height 5\' 8"  (1.727 m), weight 149 lb (67.586 kg).  General: Pleasant, NAD Psych: Normal affect. Neuro: Alert and oriented X 3. Moves all extremities spontaneously. HEENT: Normal  Neck: Supple without bruits or JVD. Lungs:  Resp regular and unlabored, CTA. Heart: RRR no s3, s4, or 2/6 systolic murmur at the LLSB. Abdomen: Soft, non-tender, non-distended, BS + x 4.  Extremities: No clubbing, cyanosis or edema. DP/PT/Radials 2+ and equal bilaterally.  Labs:  No results found for this basename: CKTOTAL, CKMB, TROPONINI,  in the last 72 hours Lab Results  Component Value Date   WBC 7.2 09/14/2013   HGB 12.3* 09/14/2013   HCT 36.2* 09/14/2013   MCV 92.8 09/14/2013   PLT 245 09/14/2013    Lab Results  Component Value Date   DDIMER 0.73* 05/30/2012   No components found with this basename: POCBNP,     Component Value Date/Time   NA 141 09/14/2013 1645   K 3.9 09/14/2013 1645   CL 103 09/14/2013 1645   CO2 25 09/14/2013 1645   GLUCOSE 111* 09/14/2013 1645   BUN 18 09/14/2013 1645   CREATININE 1.02 09/14/2013 1645   CALCIUM 9.4 09/14/2013 1645   PROT 6.2 06/30/2013 1003   ALBUMIN 3.9 06/30/2013 1003   AST 18 06/30/2013 1003   ALT 19 06/30/2013 1003   ALKPHOS 41 06/30/2013 1003   BILITOT 0.7 06/30/2013 1003   GFRNONAA  64* 09/14/2013 1645   GFRAA 74* 09/14/2013 1645   Lab Results  Component Value Date   CHOL 155 05/30/2012   HDL 68.90 05/30/2012   LDLCALC 75 05/30/2012   TRIG 57.0 05/30/2012    Accessory Clinical Findings  Echocardiogram - 2012 Study Conclusions  -  Left ventricle: The cavity size was normal. There was mild focal basal hypertrophy of the septum. Systolic function was normal. The estimated ejection fraction was in the range of 55% to 65%. Wall motion was normal; there were no regional wall motion abnormalities. Doppler parameters are consistent with abnormal left ventricular relaxation (grade 1 diastolic dysfunction). - Aortic valve: Trivial regurgitation. - Mitral valve: Mild regurgitation. - Tricuspid valve: Moderate regurgitation. - Pulmonary arteries: Systolic pressure was moderately increased. PA peak pressure: 62mm Hg (S).  ECG - SR, PACs, nonspecific T wave abnormalities   Assessment & Plan  80 year old male   1. Orthostatic hypotension - d/c ACEI as it is a vasodilator and start metoprolol 25 mg po BID. Continue fludrocortisone, we will increase as necessary.  [place]cek CBC, CMP, TSH  2. Chronic CHF with preserved LVEF - stable, asymptomatic  3. Pulmonary HTN - repeat echocardiogram   Follow up in 3-4 weeks.    Dorothy Spark, MD, [facility] 10/31/2013, 11:26 AM

## 2013-10-31 NOTE — Patient Instructions (Addendum)
Your physician has recommended you make the following change in your medication:   STOP TAKING FOSINOPRIL  START TAKING METOPROLOL 25 MG TWICE DAILY  Your physician recommends that you return for lab work in: TODAY (CBC, CMET, TSH)

## 2013-11-01 ENCOUNTER — Telehealth: Payer: Self-pay | Admitting: *Deleted

## 2013-11-01 DIAGNOSIS — E876 Hypokalemia: Secondary | ICD-10-CM

## 2013-11-01 DIAGNOSIS — G20A1 Parkinson's disease without dyskinesia, without mention of fluctuations: Secondary | ICD-10-CM

## 2013-11-01 DIAGNOSIS — I951 Orthostatic hypotension: Secondary | ICD-10-CM

## 2013-11-01 DIAGNOSIS — G20 Parkinson's disease: Secondary | ICD-10-CM

## 2013-11-01 DIAGNOSIS — I10 Essential (primary) hypertension: Secondary | ICD-10-CM

## 2013-11-01 DIAGNOSIS — E785 Hyperlipidemia, unspecified: Secondary | ICD-10-CM

## 2013-11-01 MED ORDER — POTASSIUM CHLORIDE ER 10 MEQ PO TBCR: 10.0000 meq | EXTENDED_RELEASE_TABLET | Freq: Every day | ORAL | Status: DC

## 2013-11-01 NOTE — Telephone Encounter (Signed)
Message copied by Nuala Alpha on Wed Nov 01, 2013 12:06 PM ------      Message from: Golden Hurter D      Created: Tue Oct 31, 2013  4:55 PM       Spoke to Dr.Nelson she advised start Kdur 10 meq daily.Repeat bmet at next office visit.Pt called not at home.[place]. ------

## 2013-11-01 NOTE — Telephone Encounter (Signed)
Pt contacted about Dr Meda Coffee wanting to start him on Kdur 10 mEq po daily and have a repeat BMET on 11-20-13 OV.  Kdur sent to pts pharmacy of choice.  Bmet appointment scheduled.  Pt verbalized understanding and agreed with treatment plan.

## 2013-11-15 ENCOUNTER — Emergency Department (HOSPITAL_COMMUNITY): Payer: Medicare Other

## 2013-11-15 ENCOUNTER — Emergency Department (HOSPITAL_COMMUNITY)
Admission: EM | Admit: 2013-11-15 | Discharge: 2013-11-15 | Disposition: A | Discharge status: Home / Self Care | Payer: Medicare Other | Attending: Emergency Medicine | Admitting: Emergency Medicine

## 2013-11-15 ENCOUNTER — Encounter (HOSPITAL_COMMUNITY): Payer: Self-pay | Admitting: Emergency Medicine

## 2013-11-15 DIAGNOSIS — Z87891 Personal history of nicotine dependence: Secondary | ICD-10-CM | POA: Insufficient documentation

## 2013-11-15 DIAGNOSIS — Z7982 Long term (current) use of aspirin: Secondary | ICD-10-CM | POA: Insufficient documentation

## 2013-11-15 DIAGNOSIS — E785 Hyperlipidemia, unspecified: Secondary | ICD-10-CM | POA: Insufficient documentation

## 2013-11-15 DIAGNOSIS — Z860100 Personal history of colon polyps, unspecified: Secondary | ICD-10-CM | POA: Insufficient documentation

## 2013-11-15 DIAGNOSIS — IMO0002 Reserved for concepts with insufficient information to code with codable children: Secondary | ICD-10-CM | POA: Insufficient documentation

## 2013-11-15 DIAGNOSIS — H9190 Unspecified hearing loss, unspecified ear: Secondary | ICD-10-CM | POA: Insufficient documentation

## 2013-11-15 DIAGNOSIS — E119 Type 2 diabetes mellitus without complications: Secondary | ICD-10-CM | POA: Insufficient documentation

## 2013-11-15 DIAGNOSIS — I10 Essential (primary) hypertension: Secondary | ICD-10-CM | POA: Insufficient documentation

## 2013-11-15 DIAGNOSIS — I951 Orthostatic hypotension: Secondary | ICD-10-CM | POA: Insufficient documentation

## 2013-11-15 DIAGNOSIS — R42 Dizziness and giddiness: Secondary | ICD-10-CM | POA: Insufficient documentation

## 2013-11-15 DIAGNOSIS — Z8701 Personal history of pneumonia (recurrent): Secondary | ICD-10-CM | POA: Insufficient documentation

## 2013-11-15 DIAGNOSIS — Z8546 Personal history of malignant neoplasm of prostate: Secondary | ICD-10-CM | POA: Insufficient documentation

## 2013-11-15 DIAGNOSIS — M1610 Unilateral primary osteoarthritis, unspecified hip: Secondary | ICD-10-CM | POA: Insufficient documentation

## 2013-11-15 DIAGNOSIS — Z79899 Other long term (current) drug therapy: Secondary | ICD-10-CM | POA: Insufficient documentation

## 2013-11-15 LAB — COMPREHENSIVE METABOLIC PANEL
ALT: 15 U/L (ref 0–53)
AST: 18 U/L (ref 0–37)
Albumin: 4 g/dL (ref 3.5–5.2)
Alkaline Phosphatase: 51 U/L (ref 39–117)
BILIRUBIN TOTAL: 0.5 mg/dL (ref 0.3–1.2)
BUN: 18 mg/dL (ref 6–23)
CHLORIDE: 104 meq/L (ref 96–112)
CO2: 27 meq/L (ref 19–32)
Calcium: 9.3 mg/dL (ref 8.4–10.5)
Creatinine, Ser: 0.94 mg/dL (ref 0.50–1.35)
GFR calc Af Amer: 84 mL/min — ABNORMAL LOW (ref >90)
GFR, EST NON AFRICAN AMERICAN: 72 mL/min — AB (ref >90)
Glucose, Bld: 98 mg/dL (ref 70–99)
POTASSIUM: 4 meq/L (ref 3.7–5.3)
SODIUM: 143 meq/L (ref 137–147)
Total Protein: 6.5 g/dL (ref 6.0–8.3)

## 2013-11-15 LAB — I-STAT TROPONIN, ED: TROPONIN I, POC: 0 ng/mL (ref 0.00–0.08)

## 2013-11-15 LAB — CBC
HCT: 37.6 % — ABNORMAL LOW (ref 39.0–52.0)
HEMOGLOBIN: 12.5 g/dL — AB (ref 13.0–17.0)
MCH: 31.6 pg (ref 26.0–34.0)
MCHC: 33.2 g/dL (ref 30.0–36.0)
MCV: 95.2 fL (ref 78.0–100.0)
Platelets: 230 10*3/uL (ref 150–400)
RBC: 3.95 MIL/uL — ABNORMAL LOW (ref 4.22–5.81)
RDW: 13.3 % (ref 11.5–15.5)
WBC: 6.5 10*3/uL (ref 4.0–10.5)

## 2013-11-15 LAB — I-STAT CG4 LACTIC ACID, ED: Lactic Acid, Venous: 0.99 mmol/L (ref 0.5–2.2)

## 2013-11-15 NOTE — ED Notes (Signed)
MD at bedside. 

## 2013-11-15 NOTE — ED Notes (Signed)
NOTIFIED DR. Mingo Amber OF PATIENTS LAB RESULTS OF CG4+ LACTIC ACID ,@12 //////////;55 PM ,11/15/2013.

## 2013-11-15 NOTE — Discharge Instructions (Signed)
Orthostatic Hypotension °Orthostatic hypotension is a sudden fall in blood pressure. It occurs when a person goes from a sitting or lying position to a standing position. °CAUSES  °· Loss of body fluids (dehydration). °· Medicines that lower blood pressure. °· Sudden changes in posture, such as sudden standing when you have been sitting or lying down. °· Taking too much of your medicine. °SYMPTOMS  °· Lightheadedness or dizziness. °· Fainting or near-fainting. °· A fast heart rate (tachycardia). °· Weakness. °· Feeling tired (fatigue). °DIAGNOSIS  °Your caregiver may find the cause of orthostatic hypotension through: °· A history and/or physical exam. °· Checking your blood pressure. Your caregiver will check your blood pressure when you are: °· Lying down. °· Sitting. °· Standing. °· Tilt table testing. In this test, you are placed on a table that goes from a lying position to a standing position. You will be strapped to the table. This test helps to monitor your blood pressure and heart rate when you are in different positions. °TREATMENT  °· If orthostatic hypotension is caused by your medicines, your caregiver will need to adjust your dosage. Do not stop or adjust your medicine on your own. °· When changing positions, make these changes slowly. This allows your body to adjust to the different position. °· Compression stockings that are worn on your lower legs may be helpful. °· Your caregiver may have you consume extra salt. Do not add extra salt to your diet unless directed by your caregiver. °· Eat frequent, small meals. Avoid sudden standing after eating. °· Avoid hot showers or excessive heat. °· Your caregiver may give you fluids through the vein (intravenous). °· Your caregiver may put you on medicine to help enhance fluid retention. °SEEK IMMEDIATE MEDICAL CARE IF:  °· You faint or have a near-fainting episode. Call your local emergency services (911 in [place] °· You have or develop chest pain. °· You  feel sick to your stomach (nauseous) or vomit. °· You have a loss of feeling or movement in your arms or legs. °· You have difficulty talking, slurred speech, or you are unable to talk. °· You have difficulty thinking or have confused thinking. °MAKE SURE YOU:  °· Understand these instructions. °· Will watch your condition. °· Will get help right away if you are not doing well or get worse. °Document Released: 05/29/2002 Document Revised: 08/31/2011 Document Reviewed: 09/21/2008 °[employer]® Patient Information ©2014 [employer]. ° °

## 2013-11-15 NOTE — ED Provider Notes (Signed)
CSN: [id number]     Arrival date & time 11/15/13  1200 History   First MD Initiated Contact with Patient 11/15/13 1201     Chief Complaint  Patient presents with  . Fall     (Consider location/radiation/quality/duration/timing/severity/associated sxs/prior Treatment) HPI Comments: Near-syncopal event at [employer]. 2-3 minutes after getting out of the car he had an episode where he got dizzy. He was helped to the floor by his wife and a bystander. Hx of orthostatic hypotension and HTN. No recent medication changes.   Patient is a 80 y.o. male presenting with fall. The history is provided by the patient.  Fall This is a chronic problem. The current episode started less than 1 hour ago. The problem occurs daily. The problem has been resolved. Pertinent negatives include no chest pain, no abdominal pain, no headaches and no shortness of breath. Nothing aggravates the symptoms. Nothing relieves the symptoms.    Past Medical History  Diagnosis Date  . Hyperlipidemia     takes Lipitor daily  . Adenomatous colon polyp 3/09  . Personal history of prostate cancer   . Dysphagia     with pills  . Hypertension     takes Fosinopril daily  . Sleep apnea     sleep study done in 03/30/04 in epic;doesn't use a CPAP  . Pneumonia     hx of as a child  . Arthritis     back and hip  . Dizziness     pt states when he is hot and stands up too fast  . Back pain     hx of ruptured disc  . Dry skin   . Hemorrhoid     internal   . Hx of colonic polyps   . Nocturia   . Prostate cancer   . Impaired hearing   . 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 Diabetes mellitus     patient denies Diabetes   Past Surgical History  Procedure Laterality Date  . Lumbar laminectomy  1998  . Knee arthroscopy  2001  . Rotator cuff repair Right 2/10  . Cataract extraction Bilateral   . Tonsillectomy      as a child  . Colonoscopy    . Radioactive seed implant  2008  . Total hip arthroplasty   08/24/2011    Procedure: TOTAL HIP ARTHROPLASTY;  Surgeon: Lorn Junes, MD;  Location: [place];  Service: Orthopedics;  Laterality: Right;  DR [place]  . Zenker's diverticulectomy  09/02/2011    Procedure: ZENKER'S DIVERTICULECTOMY;  Surgeon: Melida Quitter, MD;  [facility];  Service: ENT;  Laterality: N/A;   Family History  Problem Relation Age of Onset  . Heart disease Father   . Heart attack Father 50  . Colon polyps Daughter   . Colon cancer Neg Hx   . Anesthesia problems Neg Hx   . Hypotension Neg Hx   . Malignant hyperthermia Neg Hx   . Pseudochol deficiency Neg Hx   . Hypertension Mother   . Cancer Sister    History  Substance Use Topics  . Smoking status: Former Smoker    Quit date: 06/22/1961  . Smokeless tobacco: Never Used  . Alcohol Use: Yes     Comment: red wine 3-4 times/wk    Review of Systems  Constitutional: Negative for fever and chills.  Respiratory: Negative for cough and shortness of breath.   Cardiovascular: Negative for chest pain and leg swelling.  Gastrointestinal: Negative  for vomiting and abdominal pain.  Neurological: Negative for headaches.  All other systems reviewed and are negative.     Allergies  Review of patient's allergies indicates no known allergies.  Home Medications   Prior to Admission medications   Medication Sig Start Date End Date Taking? Authorizing Provider  aspirin 325 MG tablet Take 325 mg by mouth daily.    Historical Provider, MD  carbidopa-levodopa (SINEMET IR) 25-100 MG per tablet Take 1 tablet by mouth 3 (three) times daily. 08/23/13   Historical Provider, MD  fludrocortisone (FLORINEF) 0.1 MG tablet Take 1 tablet (0.1 mg total) by mouth daily. 10/03/13   Eustace Quail Tat, DO  metoprolol tartrate (LOPRESSOR) 25 MG tablet Take 1 tablet (25 mg total) by mouth 2 (two) times daily. 10/31/13   Dorothy Spark, MD  polycarbophil (FIBERCON) 625 MG tablet Take 625 mg by mouth daily.    Historical  Provider, MD  potassium chloride (K-DUR) 10 MEQ tablet Take 1 tablet (10 mEq total) by mouth daily. 11/01/13   Dorothy Spark, MD  topiramate (TOPAMAX) 25 MG tablet Take 25 mg by mouth daily.     Historical Provider, MD   SpO2 95% Physical Exam  Nursing note and vitals reviewed. Constitutional: He is oriented to person, place, and time. He appears well-developed and well-nourished. No distress.  HENT:  Head: Normocephalic and atraumatic.  Mouth/Throat: Oropharynx is clear and moist. No oropharyngeal exudate.  Eyes: EOM are normal. Pupils are equal, round, and reactive to light.  Neck: Normal range of motion. Neck supple.  Cardiovascular: Normal rate and regular rhythm.  Exam reveals no friction rub.   No murmur heard. Pulmonary/Chest: Effort normal and breath sounds normal. No respiratory distress. He has no wheezes. He has no rales.  Abdominal: Soft. He exhibits no distension. There is no tenderness. There is no rebound.  Musculoskeletal: Normal range of motion. He exhibits no edema.  Neurological: He is alert and oriented to person, place, and time. No cranial nerve deficit. He exhibits normal muscle tone. Coordination normal.  Skin: No rash noted. He is not diaphoretic.    ED Course  Procedures (including critical care time) Labs Review Labs Reviewed  CBC  COMPREHENSIVE METABOLIC PANEL  I-STAT CG4 LACTIC ACID, ED  Randolm Idol, ED    Imaging Review Dg Chest 2 View  11/15/2013   CLINICAL DATA:  Dizziness, fatigue.  EXAM: CHEST  2 VIEW  COMPARISON:  06/30/2013  FINDINGS: The heart size and mediastinal contours are within normal limits. Both lungs are clear. The visualized skeletal structures are unremarkable.  IMPRESSION: No active cardiopulmonary disease.   Electronically Signed   By: Rolm Baptise M.D.   On: 11/15/2013 13:14     EKG Interpretation   Date/Time:  Wednesday Nov 15 2013 12:21:22 EDT Ventricular Rate:  69 PR Interval:  192 QRS Duration: 88 QT Interval:   431 QTC Calculation: 462 R Axis:   69 Text Interpretation:  Sinus rhythm Borderline ST depression, diffuse leads  Baseline wander in lead(s) III aVF Similar to prior Confirmed by Mingo Amber   MD, [place] ([id number]) on 11/15/2013 12:32:13 PM      MDM   Final diagnoses:  Orthostatic hypotension    84M with hx of orthostatic hypotension, dizziness presents with near-syncope. Patient got out of the car today at the [employer] and a few minutes later became dizzy. This is normal for him. He did not fully pass out. He has hx of documented orthostatic hypotension and is on  fludrocortisone for this. Patient also has hx of Parkinson's. He was orthostatic with EMS. Patient denies CP, SOB. He states he is feeling well. Neurologically intact. Will check basic labs, check EKG. Labs ok. Review of records shows significant orthostatic hypotension. Family didn't want to bring him in, but his Nursing Home insisted. Patient feels well, will discharge home. Stable for discharge.  Osvaldo Shipper, MD 11/15/13 [phone]

## 2013-11-15 NOTE — ED Notes (Addendum)
[facility], c/o fall w/ orthostatic changes. Pt at [employer], witnessed controlled fall by wife. Denies head injury loc. Pt returned to [place], intial BP 198/. Pt was orthostatic for Ptar. 130/68 Pt received 500ns bolus pta

## 2013-11-16 ENCOUNTER — Encounter: Payer: Self-pay | Admitting: *Deleted

## 2013-11-17 ENCOUNTER — Ambulatory Visit: Payer: Medicare Other | Admitting: Internal Medicine

## 2013-11-20 ENCOUNTER — Ambulatory Visit (INDEPENDENT_AMBULATORY_CARE_PROVIDER_SITE_OTHER): Payer: Medicare Other | Admitting: Cardiology

## 2013-11-20 ENCOUNTER — Encounter: Payer: Self-pay | Admitting: Internal Medicine

## 2013-11-20 ENCOUNTER — Encounter: Payer: Self-pay | Admitting: Cardiology

## 2013-11-20 ENCOUNTER — Ambulatory Visit (INDEPENDENT_AMBULATORY_CARE_PROVIDER_SITE_OTHER): Payer: Medicare Other | Admitting: Neurology

## 2013-11-20 ENCOUNTER — Ambulatory Visit (INDEPENDENT_AMBULATORY_CARE_PROVIDER_SITE_OTHER): Payer: Medicare Other | Admitting: Internal Medicine

## 2013-11-20 ENCOUNTER — Other Ambulatory Visit: Payer: Medicare Other

## 2013-11-20 ENCOUNTER — Encounter: Payer: Self-pay | Admitting: Neurology

## 2013-11-20 VITALS — BP 140/90 | HR 59 | Temp 98.5°F | Resp 20 | Ht 68.0 in | Wt 146.0 lb

## 2013-11-20 VITALS — BP 118/63 | HR 81 | Ht 68.0 in

## 2013-11-20 VITALS — BP 120/54 | HR 73 | Ht 68.0 in | Wt 146.0 lb

## 2013-11-20 DIAGNOSIS — E876 Hypokalemia: Secondary | ICD-10-CM

## 2013-11-20 DIAGNOSIS — G20 Parkinson's disease: Secondary | ICD-10-CM

## 2013-11-20 DIAGNOSIS — I951 Orthostatic hypotension: Secondary | ICD-10-CM

## 2013-11-20 DIAGNOSIS — I10 Essential (primary) hypertension: Secondary | ICD-10-CM

## 2013-11-20 DIAGNOSIS — E785 Hyperlipidemia, unspecified: Secondary | ICD-10-CM

## 2013-11-20 DIAGNOSIS — M1990 Unspecified osteoarthritis, unspecified site: Secondary | ICD-10-CM

## 2013-11-20 LAB — BASIC METABOLIC PANEL
BUN: 16 mg/dL (ref 6–23)
CO2: 29 mEq/L (ref 19–32)
Calcium: 8.9 mg/dL (ref 8.4–10.5)
Chloride: 103 mEq/L (ref 96–112)
Creatinine, Ser: 1 mg/dL (ref 0.4–1.5)
GFR: 73.24 mL/min (ref >60.00)
Glucose, Bld: 80 mg/dL (ref 70–99)
Potassium: 3.6 mEq/L (ref 3.5–5.1)
Sodium: 139 mEq/L (ref 135–145)

## 2013-11-20 MED ORDER — FLUDROCORTISONE ACETATE 0.1 MG PO TABS: 0.1000 mg | ORAL_TABLET | Freq: Two times a day (BID) | ORAL | Status: DC

## 2013-11-20 NOTE — Progress Notes (Signed)
Darius Castro was seen today in the movement disorders clinic for neurologic consultation at the request of [name], MD.  The consultation is for the evaluation of tremor and to r/o ET vs PD.  This patient is accompanied in the office by his spouse who supplements the history.  Pt reports a one year hx of tremor in the RUE.  He notes it most when he is just walking or at rest.  He will sometimes notice it when using it.  He is R hand dominant.  His wife also noted   Specific Symptoms:  Tremor: yes (R hand only)  -Affected by caffeine:  no  Affected by alcohol: unknown  Affected by stress:  no  Affected by fatigue:  no  Spills soup if on spoon:  no  Spills glass of liquid if full:  no  Affects ADL's (tying shoes, brushing teeth, etc):  no Voice: hypophonic and decreased ability to understand pt Sleep: sleeps well  Vivid Dreams:  no  Acting out dreams:  no Wet Pillows: no Postural symptoms:  yes (shuffling x 1 year and getting worse)  Falls?  yes (1-2 total and last was few months ago - doesn't recall exact circumstance) Bradykinesia symptoms: shuffling gait, slow movements, difficulty getting out of a chair and difficulty regaining balance Loss of smell:  yes Loss of taste:  no Urinary Incontinence:  yes (rarely and just leakage) Difficulty Swallowing:  no Handwriting, micrographia: yes Trouble with ADL's:  no  Trouble buttoning clothing: no Depression:  no Memory changes:  no (still does driving/finances) Hallucinations:  no  visual distortions: no N/V:  no Lightheaded:  yes  Syncope: no Diplopia:  no Dyskinesia:  no  Neuroimaging has previously been performed.  MRI brain was performed in 2008 demonstrating atrophy and mild small vessel disease.  It is available for my review today.  Pt saw Dr. Brett Fairy in 2008 for diplopia and was dx with migraine and was started on topamax.  Was free of visual changes for a year and then restarted and increased med to 25 mg -  2 po bid.  Had SE with the medication (tired, dizzy) and went back on topamax 25 mg bid 2 weeks ago after bad migraine (b/l frontal, cannot describe quality, no vision change - took advil with quick relief)  10/03/13: The patient is following up in Parkinson's disease, which was just diagnosed on 08/23/2013.  This patient is accompanied in the office by his spouse who supplements the history.  At that time I started him on levodopa. Pt is not sure the carbidopa/levodopa 25/100 is helping.  I did review notes available to me since last visit.  After our last visit, he immediately went to his primary care physician because of orthostatic hypotension.  His Monopril was decreased.  He went to the emergency room on 09/14/2013.  He had fallen after becoming dizzy.  There was no associated loss of consciousness.  He did end up fracturing his nasal bone.  In the end, his blood pressure medicine was discontinued.  Despite this, he continued to have dizziness that has been disabling. Pt feels rather insistent that this is not from the levodopa, as it has been going on for 2 years.  Occasional diplopia, lasting 3-4 minutes.  Is always with the dizziness.    11/20/13 update:  The patient is following up today, accompanied by his daughter and son-in-law who supplements the history.  The patient has seen cardiology and his primary  care physician today.  Records were reviewed since last visit.  Last visit, I started him on Florinef.  I also requested a cardiology consult.  They initially discontinued his Monopril because of a vasodilatory properties of ACE inhibitors and changed it to metoprolol 25 mg twice a day.  The patient went to the emergency room on May 27 because of a fall after near syncopal episode after getting out of a car.  The patient felt dizzy but did not actually lose consciousness.  He followed up with cardiology today and they discontinued his metoprolol and increased the Florinef to 0.2 mg.  I also received  notes from the patient's physical therapist stating that the patient will have bouts of dizziness that lasts up to 10 seconds and he will have to sit in order to not fall.  There have been no hallucinations.  No headaches.  PREVIOUS MEDICATIONS: none to date  ALLERGIES:  No Known Allergies  CURRENT MEDICATIONS:  Current Outpatient Prescriptions on File Prior to Visit  Medication Sig Dispense Refill  . aspirin 325 MG tablet Take 325 mg by mouth daily.      . carbidopa-levodopa (SINEMET IR) 25-100 MG per tablet Take 1 tablet by mouth 3 (three) times daily.      . fludrocortisone (FLORINEF) 0.1 MG tablet Take 1 tablet (0.1 mg total) by mouth 2 (two) times daily. TAKE 2 DAILY  180 tablet  5  . polycarbophil (FIBERCON) 625 MG tablet Take 625 mg by mouth daily.      . potassium chloride (K-DUR) 10 MEQ tablet Take 1 tablet (10 mEq total) by mouth daily.  90 tablet  3  . topiramate (TOPAMAX) 25 MG tablet Take 25 mg by mouth daily.        Current Facility-Administered Medications on File Prior to Visit  Medication Dose Route Frequency Provider Last Rate Last Dose  . povidone-iodine (BETADINE) 7.5 % scrub   Topical Once Kirstin Earl Lagos, PA-C        PAST MEDICAL HISTORY:   Past Medical History  Diagnosis Date  . Hyperlipidemia     takes Lipitor daily  . Adenomatous colon polyp 3/09  . Personal history of prostate cancer   . Dysphagia     with pills  . Hypertension     takes Fosinopril daily  . Sleep apnea     sleep study done in 03/30/04 in epic;doesn't use a CPAP  . Pneumonia     hx of as a child  . Arthritis     back and hip  . Dizziness     pt states when he is hot and stands up too fast  . Back pain     hx of ruptured disc  . Dry skin   . Hemorrhoid     internal   . Hx of colonic polyps   . Nocturia   . Prostate cancer   . Impaired hearing   . 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 Diabetes mellitus     patient denies Diabetes    PAST SURGICAL HISTORY:     Past Surgical History  Procedure Laterality Date  . Lumbar laminectomy  1998  . Knee arthroscopy  2001  . Rotator cuff repair Right 2/10  . Cataract extraction Bilateral   . Tonsillectomy      as a child  . Colonoscopy    . Radioactive seed implant  2008  . Total hip arthroplasty  08/24/2011    Procedure: TOTAL HIP ARTHROPLASTY;  Surgeon:  Lorn Junes, MD;  Location: [place];  Service: Orthopedics;  Laterality: Right;  DR [place]  . Zenker's diverticulectomy  09/02/2011    Procedure: ZENKER'S DIVERTICULECTOMY;  Surgeon: Melida Quitter, MD;  Location: [place];  Service: ENT;  Laterality: N/A;    SOCIAL HISTORY:   History   Social History  . Marital Status: Married    Spouse Name: N/A    Number of Children: 3  . Years of Education: N/A   Occupational History  . retired    Social History Main Topics  . Smoking status: Former Smoker    Quit date: 06/22/1961  . Smokeless tobacco: Never Used  . Alcohol Use: Yes     Comment: red wine 3-4 times/wk  . Drug Use: No  . Sexual Activity: Not Currently   Other Topics Concern  . Not on file   Social History Narrative  . No narrative on file    FAMILY HISTORY:   Family Status  Relation Status Death Age  . Father Deceased 7  . Mother Deceased 48    old age  . Sister Deceased 63    ROS:  A complete 10 system review of systems was obtained and was unremarkable apart from what is mentioned above.  PHYSICAL EXAMINATION:    VITALS:   Filed Vitals:   11/20/13 1349  BP: 120/54  Pulse: 73  Height: 5\' 8"  (1.727 m)  Weight: 146 lb (66.225 kg)    GEN:  The patient appears stated age and is in NAD. HEENT:  Normocephalic, atraumatic.  The mucous membranes are moist. The superficial temporal arteries are without ropiness or tenderness. CV:  RRR Lungs:  CTAB Neck/HEME:  There are no carotid bruits bilaterally.  Neurological examination:  Orientation: The patient is alert and oriented x3. Fund of  knowledge is appropriate.  Recent and remote memory are intact.  Attention and concentration are normal.    Able to name objects and repeat phrases.  Very good historian. Cranial nerves: There is good facial symmetry. There is facial hypomimia.  Pupils are equal round and minimally reactive to light bilaterally. Fundoscopic exam is attempted but the disc margins are not well visualized bilaterally. Extraocular muscles are intact. The visual fields are full to confrontational testing. The speech is fluent and clear. There is hypophonic speech.  Soft palate rises symmetrically and there is no tongue deviation. Hearing is intact to conversational tone. Sensation: Sensation is intact to light and pinprick throughout (facial, trunk, extremities). Vibration is intact at the bilateral big toe. There is no extinction with double simultaneous stimulation. There is no sensory dermatomal level identified. Motor: Strength is 5/5 in the bilateral upper and lower extremities.   Shoulder shrug is equal and symmetric.  There is no pronator drift.   Movement examination: Tone: Mild increased tone in the bilateral upper extremities, but the patient did not take his noon dose of medication today because of multiple doctor appointments. Abnormal movements: Rare RUE resting tremor, most prominent with ambulation. Coordination:  There is decremation with RAM's, especially on the L Gait and Station: The patient has difficulty arising out of a deep-seated chair without the use of the hands. The patient's stride length is good with his walker, but after about 30 feet, he does become dizzy and somewhat winded.  ASSESSMENT/PLAN:  1.  Parkinsonism.  I suspect that this does represent idiopathic Parkinson's disease.  The patient has tremor, bradykinesia, rigidity and postural instability.  One of the  atypical states cannot be ruled out given the [facility] that seems fairly early on in the diagnosis, but given hx wonder if PD wasn't going  on longer and just not dx?  -  Will cautiously continue carbidopa/levodopa 25/100 tid.  -[facility] for PT/OT and ST.   2.  Significant, symptommatic orthostatic hypotension  --I. agree with the discontinuation of the metoprolol and the increase of Florinef.  We will have to monitor for supine hypertension.  We talked about the risks of Florinef today with the patient and his family.  -He was asked to monitor his blood pressure at home both in the sitting, standing and supine position and send me results for my chart.  -If florinef no help, will consider droxidopa.  We can even add this on to Florinef, if necessary.  Almost 30% of the patients in the study were on Florinef and droxidopa together. 3.  Hx of migraine  -Don't care for the topamax. Pt recently stopped and got a migraine and wants to continue for now.  Hoping to d/c in the future.  I discussed this with the patient and his family in detail today.  We decided not to stop it today because of stopping the metoprolol and increase in the Florinef, but I would like to target it in the future. 4.  Follow up 4 weeks.

## 2013-11-20 NOTE — Progress Notes (Signed)
Pre-visit discussion using our clinic review tool. No additional management support is needed unless otherwise documented below in the visit note.  

## 2013-11-20 NOTE — Patient Instructions (Signed)
Message me your blood pressure readings.

## 2013-11-20 NOTE — Progress Notes (Signed)
Subjective:    Patient ID: Darius Castro, male    DOB: [birth year], 80 y.o.   MRN: [id number]  HPI  80 year old patient who has a history of PD with orthostatic hypotension.  Patient has been followed by neurology and was seen and followed by cardiology earlier today.  Due to dizziness and orthostatic hypotension.  Metoprolol was discontinued earlier today.  The patient has been on Florinef and this has been increased as well.  Medical records reviewed  Past Medical History  Diagnosis Date  . Hyperlipidemia     takes Lipitor daily  . Adenomatous colon polyp 3/09  . Personal history of prostate cancer   . Dysphagia     with pills  . Hypertension     takes Fosinopril daily  . Sleep apnea     sleep study done in 03/30/04 in epic;doesn't use a CPAP  . Pneumonia     hx of as a child  . Arthritis     back and hip  . Dizziness     pt states when he is hot and stands up too fast  . Back pain     hx of ruptured disc  . Dry skin   . Hemorrhoid     internal   . Hx of colonic polyps   . Nocturia   . Prostate cancer   . Impaired hearing   . 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 Diabetes mellitus     patient denies Diabetes    History   Social History  . Marital Status: Married    Spouse Name: N/A    Number of Children: 3  . Years of Education: N/A   Occupational History  . retired    Social History Main Topics  . Smoking status: Former Smoker    Quit date: 06/22/1961  . Smokeless tobacco: Never Used  . Alcohol Use: Yes     Comment: red wine 3-4 times/wk  . Drug Use: No  . Sexual Activity: Not Currently   Other Topics Concern  . Not on file   Social History Narrative  . No narrative on file    Past Surgical History  Procedure Laterality Date  . Lumbar laminectomy  1998  . Knee arthroscopy  2001  . Rotator cuff repair Right 2/10  . Cataract extraction Bilateral   . Tonsillectomy      as a child  . Colonoscopy    . Radioactive seed implant  2008    . Total hip arthroplasty  08/24/2011    Procedure: TOTAL HIP ARTHROPLASTY;  Surgeon: Lorn Junes, MD;  Location: [place];  Service: Orthopedics;  Laterality: Right;  DR [place]  . Zenker's diverticulectomy  09/02/2011    Procedure: ZENKER'S DIVERTICULECTOMY;  Surgeon: Melida Quitter, MD;  Location: [facility];  Service: ENT;  Laterality: N/A;    Family History  Problem Relation Age of Onset  . Heart disease Father   . Heart attack Father 61  . Colon polyps Daughter   . Colon cancer Neg Hx   . Anesthesia problems Neg Hx   . Hypotension Neg Hx   . Malignant hyperthermia Neg Hx   . Pseudochol deficiency Neg Hx   . Hypertension Mother   . Cancer Sister     No Known Allergies  Current Outpatient Prescriptions on File Prior to Visit  Medication Sig Dispense Refill  . aspirin 325 MG tablet Take 325 mg by mouth daily.      Marland Kitchen  carbidopa-levodopa (SINEMET IR) 25-100 MG per tablet Take 1 tablet by mouth 3 (three) times daily.      . polycarbophil (FIBERCON) 625 MG tablet Take 625 mg by mouth daily.      . potassium chloride (K-DUR) 10 MEQ tablet Take 1 tablet (10 mEq total) by mouth daily.  90 tablet  3  . topiramate (TOPAMAX) 25 MG tablet Take 25 mg by mouth daily.        Current Facility-Administered Medications on File Prior to Visit  Medication Dose Route Frequency Provider Last Rate Last Dose  . povidone-iodine (BETADINE) 7.5 % scrub   Topical Once Kirstin J Shepperson, PA-C        BP 140/90  Pulse 59  Temp(Src) 98.5 F (36.9 C) (Oral)  Resp 20  Ht 5\' 8"  (1.727 m)  Wt 146 lb (66.225 kg)  BMI 22.20 kg/m2  SpO2 97%       Review of Systems  Constitutional: Negative for fever, chills, appetite change and fatigue.  HENT: Negative for congestion, dental problem, ear pain, hearing loss, sore throat, tinnitus, trouble swallowing and voice change.   Eyes: Negative for pain, discharge and visual disturbance.  Respiratory: Negative for cough, chest tightness,  wheezing and stridor.   Cardiovascular: Negative for chest pain, palpitations and leg swelling.  Gastrointestinal: Negative for nausea, vomiting, abdominal pain, diarrhea, constipation, blood in stool and abdominal distention.  Genitourinary: Negative for urgency, hematuria, flank pain, discharge, difficulty urinating and genital sores.  Musculoskeletal: Negative for arthralgias, back pain, gait problem, joint swelling, myalgias and neck stiffness.  Skin: Negative for rash.  Neurological: Positive for dizziness, weakness and light-headedness. Negative for syncope, speech difficulty, numbness and headaches.  Hematological: Negative for adenopathy. Does not bruise/bleed easily.  Psychiatric/Behavioral: Negative for behavioral problems and dysphoric mood. The patient is not nervous/anxious.        Objective:   Physical Exam  Constitutional: He is oriented to person, place, and time. He appears well-developed.  Blood pressure 138/80, sitting Blood pressure 90/60 standing  HENT:  Head: Normocephalic.  Right Ear: External ear normal.  Left Ear: External ear normal.  Eyes: Conjunctivae and EOM are normal.  Neck: Normal range of motion.  Cardiovascular: Normal rate and normal heart sounds.   Pulmonary/Chest: Breath sounds normal.  Abdominal: Bowel sounds are normal.  Musculoskeletal: Normal range of motion. He exhibits no edema and no tenderness.  Neurological: He is alert and oriented to person, place, and time.  Psychiatric: He has a normal mood and affect. His behavior is normal.          Assessment & Plan:   Orthostatic hypotension, with chronic dizziness.  Beta blocker therapy, discontinued.  Increase Florinef to a twice a day regimen. PD.  Followup neurology Osteoarthritis

## 2013-11-20 NOTE — Patient Instructions (Addendum)
INCREASE FLORINEF (FLUDROCORTISONE) TO 0.2 MG DAILY   STOP METOPROLOL   Your physician recommends that you schedule a follow-up appointment in: AS NEEDED

## 2013-11-20 NOTE — Progress Notes (Signed)
Patient ID: Darius Castro, male   DOB: 20-Mar-1924, 80 y.o.   MRN: [id number]    Patient Name: Darius Castro Date of Encounter: 11/20/2013  Primary Care Provider:  [name], MD Primary Cardiologist:  Dorothy Spark (previously Dr Verl Blalock)   Problem List   Past Medical History  Diagnosis Date  . Hyperlipidemia     takes Lipitor daily  . Adenomatous colon polyp 3/09  . Personal history of prostate cancer   . Dysphagia     with pills  . Hypertension     takes Fosinopril daily  . Sleep apnea     sleep study done in 03/30/04 in epic;doesn't use a CPAP  . Pneumonia     hx of as a child  . Arthritis     back and hip  . Dizziness     pt states when he is hot and stands up too fast  . Back pain     hx of ruptured disc  . Dry skin   . Hemorrhoid     internal   . Hx of colonic polyps   . Nocturia   . Prostate cancer   . Impaired hearing   . 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 Diabetes mellitus     patient denies Diabetes   Past Surgical History  Procedure Laterality Date  . Lumbar laminectomy  1998  . Knee arthroscopy  2001  . Rotator cuff repair Right 2/10  . Cataract extraction Bilateral   . Tonsillectomy      as a child  . Colonoscopy    . Radioactive seed implant  2008  . Total hip arthroplasty  08/24/2011    Procedure: TOTAL HIP ARTHROPLASTY;  Surgeon: Lorn Junes, MD;  Location: [place];  Service: Orthopedics;  Laterality: Right;  DR [place]  . Zenker's diverticulectomy  09/02/2011    Procedure: ZENKER'S DIVERTICULECTOMY;  Surgeon: Melida Quitter, MD;  Location: [facility] OR;  Service: ENT;  Laterality: N/A;    Allergies  No Known Allergies  HPI  A 80 year old male with h/o hyperlipidemia, hypertension who is coming with the main concern of dizziness on a daily basis especially when he stands up too fast. Going off the BP med 2 wks ago is not what is making him dizzy, he states this has been going on 2 yrs, they think it  could be his migraine med which on the bottle states it could cause dizziness topiramate.  Pt stays dizzy all the time. Even when he goes to exercise, he has to sit down cause he's dizzy.   He reports no falls, no palpitations, chest pain or shortness of breath. No syncope.  He is currently taking fludrocortisone 0.1 mg daily.  Orthostatics today: Lying: 181/83, HR 73 Sitting: 152/79, HR 75 Standing: 104/55, HR 73   Stop ACE inhibitor at the last visit and started beta blocker. However patient states that his symptoms got even worse. He brings a letter from his physical therapist that your not able to perform any therapy as he feels dizzy every time he stands up.  Home Medications  Prior to Admission medications   Medication Sig Start Date End Date Taking? Authorizing Provider  aspirin 325 MG tablet Take 325 mg by mouth daily.   Yes Historical Provider, MD  carbidopa-levodopa (SINEMET IR) 25-100 MG per tablet Take 1 tablet by mouth 3 (three) times daily. 08/23/13  Yes Historical Provider, MD  fludrocortisone (FLORINEF) 0.1 MG tablet  Take 1 tablet (0.1 mg total) by mouth daily. 10/03/13  Yes Rebecca S Tat, DO  fosinopril (MONOPRIL) 20 MG tablet  08/23/13  Yes Historical Provider, MD  polycarbophil (FIBERCON) 625 MG tablet Take 625 mg by mouth daily.   Yes Historical Provider, MD  topiramate (TOPAMAX) 25 MG tablet Take 25 mg by mouth daily.    Yes Historical Provider, MD    Family History  Family History  Problem Relation Age of Onset  . Heart disease Father   . Heart attack Father 35  . Colon polyps Daughter   . Colon cancer Neg Hx   . Anesthesia problems Neg Hx   . Hypotension Neg Hx   . Malignant hyperthermia Neg Hx   . Pseudochol deficiency Neg Hx   . Hypertension Mother   . Cancer Sister     Social History  History   Social History  . Marital Status: Married    Spouse Name: N/A    Number of Children: 3  . Years of Education: N/A   Occupational History  . retired     Social History Main Topics  . Smoking status: Former Smoker    Quit date: 06/22/1961  . Smokeless tobacco: Never Used  . Alcohol Use: Yes     Comment: red wine 3-4 times/wk  . Drug Use: No  . Sexual Activity: Not Currently   Other Topics Concern  . Not on file   Social History Narrative  . No narrative on file     Review of Systems, as per HPI, otherwise negative General:  No chills, fever, night sweats or weight changes.  Cardiovascular:  No chest pain, dyspnea on exertion, edema, orthopnea, palpitations, paroxysmal nocturnal dyspnea. Dermatological: No rash, lesions/masses Respiratory: No cough, dyspnea Urologic: No hematuria, dysuria Abdominal:   No nausea, vomiting, diarrhea, bright red blood per rectum, melena, or hematemesis Neurologic:  No visual changes, wkns, changes in mental status. All other systems reviewed and are otherwise negative except as noted above.  Physical Exam  Blood pressure 118/63, pulse 81, height 5\' 8"  (1.727 m).  General: Pleasant, NAD Psych: Normal affect. Neuro: Alert and oriented X 3. Moves all extremities spontaneously. HEENT: Normal  Neck: Supple without bruits or JVD. Lungs:  Resp regular and unlabored, CTA. Heart: RRR no s3, s4, or 2/6 systolic murmur at the LLSB. Abdomen: Soft, non-tender, non-distended, BS + x 4.  Extremities: No clubbing, cyanosis or edema. DP/PT/Radials 2+ and equal bilaterally.  Labs:  No results found for this basename: CKTOTAL, CKMB, TROPONINI,  in the last 72 hours Lab Results  Component Value Date   WBC 6.5 11/15/2013   HGB 12.5* 11/15/2013   HCT 37.6* 11/15/2013   MCV 95.2 11/15/2013   PLT 230 11/15/2013    Lab Results  Component Value Date   DDIMER 0.73* 05/30/2012   No components found with this basename: POCBNP,     Component Value Date/Time   NA 143 11/15/2013 1230   K 4.0 11/15/2013 1230   CL 104 11/15/2013 1230   CO2 27 11/15/2013 1230   GLUCOSE 98 11/15/2013 1230   BUN 18 11/15/2013 1230    CREATININE 0.94 11/15/2013 1230   CALCIUM 9.3 11/15/2013 1230   PROT 6.5 11/15/2013 1230   ALBUMIN 4.0 11/15/2013 1230   AST 18 11/15/2013 1230   ALT 15 11/15/2013 1230   ALKPHOS 51 11/15/2013 1230   BILITOT 0.5 11/15/2013 1230   GFRNONAA 72* 11/15/2013 1230   GFRAA 84* 11/15/2013 1230   Lab  Results  Component Value Date   CHOL 155 05/30/2012   HDL 68.90 05/30/2012   LDLCALC 75 05/30/2012   TRIG 57.0 05/30/2012    Accessory Clinical Findings  Echocardiogram - 2012 Study Conclusions  - Left ventricle: The cavity size was normal. There was mild focal basal hypertrophy of the septum. Systolic function was normal. The estimated ejection fraction was in the range of 55% to 65%. Wall motion was normal; there were no regional wall motion abnormalities. Doppler parameters are consistent with abnormal left ventricular relaxation (grade 1 diastolic dysfunction). - Aortic valve: Trivial regurgitation. - Mitral valve: Mild regurgitation. - Tricuspid valve: Moderate regurgitation. - Pulmonary arteries: Systolic pressure was moderately increased. PA peak pressure: 18mm Hg (S).  ECG - SR, PACs, nonspecific T wave abnormalities   Assessment & Plan  80 year old male   1. Orthostatic hypotension - d/c ACEI, we will discontinue metoprolol. At this point we will just follow close on comfort care and we will increase fludrocortisone 0.2 mg daily.  All labs were checked at the last visit there were normal.  2. Chronic CHF with preserved LVEF - stable, asymptomatic  3. Pulmonary HTN - repeat echocardiogram   We will call the patient the next week to check on his symptoms.   Dorothy Spark, MD, [facility] 11/20/2013, 10:05 AM

## 2013-11-20 NOTE — Patient Instructions (Signed)
Increase Florinef to 0.1 mg twice daily  Discontinue metoprolol

## 2013-11-21 ENCOUNTER — Telehealth: Payer: Self-pay | Admitting: Neurology

## 2013-11-21 NOTE — Telephone Encounter (Signed)
Thanks.  Give it a couple of days.  Curious to see a few in the supine position but he may have to come in to let us or his PCP do those as they are hard to do yourself.  The middle one is a little high but the others are really okay.  As we talked about, and as he talked about with his cardiologist, we will tolerate BP's a little higher than normal but want to monitor the supine ones closely.

## 2013-11-21 NOTE — Telephone Encounter (Signed)
Spoke with patient and made him aware. They will record readings daily and send [place] a message through mychart next week.

## 2013-11-21 NOTE — Telephone Encounter (Signed)
Pt states that DR Tat wanted to know the blood pressure readings  11-20-13 at 7:05 pm 148/82 sitting 11-21-13 at 7:30 am 194/97 sitting 11-21-13 at 7:34 am 134/69 standing  Pt phone number is [phone]

## 2013-11-22 ENCOUNTER — Encounter: Payer: Self-pay | Admitting: Neurology

## 2013-11-22 ENCOUNTER — Encounter: Payer: Self-pay | Admitting: Cardiology

## 2013-11-24 ENCOUNTER — Encounter: Payer: Self-pay | Admitting: Cardiology

## 2013-11-24 ENCOUNTER — Encounter: Payer: Self-pay | Admitting: Neurology

## 2013-11-27 ENCOUNTER — Encounter: Payer: Self-pay | Admitting: Neurology

## 2013-11-28 ENCOUNTER — Ambulatory Visit: Payer: Medicare Other | Admitting: Neurology

## 2013-11-28 ENCOUNTER — Telehealth: Payer: Self-pay | Admitting: *Deleted

## 2013-11-28 NOTE — Telephone Encounter (Signed)
Message copied by Nuala Alpha on Tue Nov 28, 2013  2:47 PM ------      Message from: Dorothy Spark      Created: Mon Nov 20, 2013 10:54 AM       Karlene Einstein,      Would you please call this patient sometimes around June 10 2 figure out if he still continues having symptoms in regards to dizziness and please let me know.      Thank you,      [place] Siren       ------

## 2013-11-28 NOTE — Telephone Encounter (Signed)
Contacted pt to discuss if he's still experiencing dizziness. Pt states he is still having these episodes, especially when he stands up.  Pt denies any cp or sob, just dizzy spells.  Informed pt that I will make Dr Meda Coffee aware of this, so she can further advise.  Pt verbalized understanding and appreciates the check up.

## 2013-11-29 NOTE — Telephone Encounter (Signed)
Pt notified that per Dr Meda Coffee, he should continue on current medication regimen and allow his blood to circulate before changing positions.  Informed him the episodes he is experiencing is due to his Parkinson's per Dr Meda Coffee.  Advised pt to use walker when getting around.  Pt verbalized understanding and pleased with the follow-up.

## 2013-11-29 NOTE — Telephone Encounter (Signed)
Since we took him off all of his BP meds its most probably due to combination of aging and Parkinson disease. He just has to be extra carefull when standing or walking. I am not sure if he uses walker but in case he doesn't he should. Thank you, KN

## 2013-12-01 ENCOUNTER — Encounter: Payer: Self-pay | Admitting: Neurology

## 2013-12-04 ENCOUNTER — Ambulatory Visit: Payer: Medicare Other | Admitting: Internal Medicine

## 2013-12-05 ENCOUNTER — Encounter: Payer: Self-pay | Admitting: Internal Medicine

## 2013-12-08 ENCOUNTER — Ambulatory Visit: Payer: Medicare Other | Admitting: Internal Medicine

## 2013-12-20 ENCOUNTER — Encounter: Payer: Self-pay | Admitting: Neurology

## 2013-12-20 ENCOUNTER — Ambulatory Visit (INDEPENDENT_AMBULATORY_CARE_PROVIDER_SITE_OTHER): Payer: Medicare Other | Admitting: Neurology

## 2013-12-20 VITALS — BP 108/60 | HR 80 | Ht 68.0 in | Wt 149.0 lb

## 2013-12-20 DIAGNOSIS — G20 Parkinson's disease: Secondary | ICD-10-CM

## 2013-12-20 DIAGNOSIS — G43909 Migraine, unspecified, not intractable, without status migrainosus: Secondary | ICD-10-CM

## 2013-12-20 DIAGNOSIS — I951 Orthostatic hypotension: Secondary | ICD-10-CM

## 2013-12-20 NOTE — Patient Instructions (Addendum)
1. Discontinue Topamax.   2. Constipation and Parkinson's disease:  1.Rancho recipe for constipation in Parkinsons Disease:  -1 cup of bran, 2 cups of applesauce in 1 cup of prune juice 2.  Increase fiber intake (Metamucil,vegetables) 3.  Regular, moderate exercise can be beneficial. 4.  Avoid medications causing constipation, such as medications like antacids with calcium or magnesium 5.  Laxative overuse should be avoided. 6.  Stool softeners (Colace) can help with chronic constipation.

## 2013-12-20 NOTE — Progress Notes (Signed)
Darius Castro was seen today in the movement disorders clinic for neurologic consultation at the request of Darius Hurter, MD.  The consultation is for the evaluation of tremor and to r/o ET vs PD.  This patient is accompanied in the office by his spouse who supplements the history.  Pt reports a one year hx of tremor in the RUE.  He notes it most when he is just walking or at rest.  He will sometimes notice it when using it.  He is R hand dominant.  His wife also noted   Specific Symptoms:  Tremor: yes (R hand only)  -Affected by caffeine:  no  Affected by alcohol: unknown  Affected by stress:  no  Affected by fatigue:  no  Spills soup if on spoon:  no  Spills glass of liquid if full:  no  Affects ADL's (tying shoes, brushing teeth, etc):  no Voice: hypophonic and decreased ability to understand pt Sleep: sleeps well  Vivid Dreams:  no  Acting out dreams:  no Wet Pillows: no Postural symptoms:  yes (shuffling x 1 year and getting worse)  Falls?  yes (1-2 total and last was few months ago - doesn't recall exact circumstance) Bradykinesia symptoms: shuffling gait, slow movements, difficulty getting out of a chair and difficulty regaining balance Loss of smell:  yes Loss of taste:  no Urinary Incontinence:  yes (rarely and just leakage) Difficulty Swallowing:  no Handwriting, micrographia: yes Trouble with ADL's:  no  Trouble buttoning clothing: no Depression:  no Memory changes:  no (still does driving/finances) Hallucinations:  no  visual distortions: no N/V:  no Lightheaded:  yes  Syncope: no Diplopia:  no Dyskinesia:  no  Neuroimaging has previously been performed.  MRI brain was performed in 2008 demonstrating atrophy and mild small vessel disease.  It is available for my review today.  Pt saw Dr. Brett Castro in 2008 for diplopia and was dx with migraine and was started on topamax.  Was free of visual changes for a year and then restarted and increased med to 25 mg -  2 po bid.  Had SE with the medication (tired, dizzy) and went back on topamax 25 mg bid 2 weeks ago after bad migraine (b/l frontal, cannot describe quality, no vision change - took advil with quick relief)  10/03/13: The patient is following up in Parkinson's disease, which was just diagnosed on 08/23/2013.  This patient is accompanied in the office by his spouse who supplements the history.  At that time I started him on levodopa. Pt is not sure the carbidopa/levodopa 25/100 is helping.  I did review notes available to me since last visit.  After our last visit, he immediately went to his primary care physician because of orthostatic hypotension.  His Monopril was decreased.  He went to the emergency room on 09/14/2013.  He had fallen after becoming dizzy.  There was no associated loss of consciousness.  He did end up fracturing his nasal bone.  In the end, his blood pressure medicine was discontinued.  Despite this, he continued to have dizziness that has been disabling. Pt feels rather insistent that this is not from the levodopa, as it has been going on for 2 years.  Occasional diplopia, lasting 3-4 minutes.  Is always with the dizziness.    11/20/13 update:  The patient is following up today, accompanied by his daughter and son-in-law who supplements the history.  The patient has seen cardiology and his primary  care physician today.  Records were reviewed since last visit.  Last visit, I started him on Florinef.  I also requested a cardiology consult.  They initially discontinued his Monopril because of a vasodilatory properties of ACE inhibitors and changed it to metoprolol 25 mg twice a day.  The patient went to the emergency room on May 27 because of a fall after near syncopal episode after getting out of a car.  The patient felt dizzy but did not actually lose consciousness.  He followed up with cardiology today and they discontinued his metoprolol and increased the Florinef to 0.2 mg.  I also received  notes from the patient's physical therapist stating that the patient will have bouts of dizziness that lasts up to 10 seconds and he will have to sit in order to not fall.  There have been no hallucinations.  No headaches.  12/20/13 update:  The patient is seen back in followup for his Parkinson's disease.  He is accompanied by his daughter who supplements the history.  He is currently on carbidopa/levodopa 25/100, one tablet 3 times per day.  Last visit, his biggest difficulty was orthostatic hypotension.  His Florinef was increased and metoprolol was decreased.  He sent me a series of blood pressure are observations, and most of those were quite good.  Today, the patient states that he is no longer dizzy, but his daughter disagrees.  She does give me a log that the patient has been keeping.  Often times, he has dizziness in the morning until approximately 10 AM or 11 AM and then he does well.  For the last 3 days he has not been dizzy at all.  She states that the physical therapists have told her that they have put therapy on hold because he could not participate because of dizziness.  The patient reports feeling much better than prior to last visit.  No headaches.  No hallucinations.  No near syncope.  PREVIOUS MEDICATIONS: none to date  ALLERGIES:  No Known Allergies  CURRENT MEDICATIONS:  Current Outpatient Prescriptions on File Prior to Visit  Medication Sig Dispense Refill  . aspirin 325 MG tablet Take 325 mg by mouth daily.      . carbidopa-levodopa (SINEMET IR) 25-100 MG per tablet Take 1 tablet by mouth 3 (three) times daily.      . fludrocortisone (FLORINEF) 0.1 MG tablet Take 1 tablet (0.1 mg total) by mouth 2 (two) times daily. TAKE 2 DAILY  180 tablet  5  . polycarbophil (FIBERCON) 625 MG tablet Take 625 mg by mouth daily.      . potassium chloride (K-DUR) 10 MEQ tablet Take 1 tablet (10 mEq total) by mouth daily.  90 tablet  3  . topiramate (TOPAMAX) 25 MG tablet Take 25 mg by mouth  daily.        Current Facility-Administered Medications on File Prior to Visit  Medication Dose Route Frequency Provider Last Rate Last Dose  . povidone-iodine (BETADINE) 7.5 % scrub   Topical Once Kirstin Earl Lagos, PA-C        PAST MEDICAL HISTORY:   Past Medical History  Diagnosis Date  . Hyperlipidemia     takes Lipitor daily  . Adenomatous colon polyp 3/09  . Personal history of prostate cancer   . Dysphagia     with pills  . Hypertension     takes Fosinopril daily  . Sleep apnea     sleep study done in 03/30/04 in epic;doesn't use a CPAP  .  Pneumonia     hx of as a child  . Arthritis     back and hip  . Dizziness     pt states when he is hot and stands up too fast  . Back pain     hx of ruptured disc  . Dry skin   . Hemorrhoid     internal   . Hx of colonic polyps   . Nocturia   . Prostate cancer   . Impaired hearing   . 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 Diabetes mellitus     patient denies Diabetes    PAST SURGICAL HISTORY:   Past Surgical History  Procedure Laterality Date  . Lumbar laminectomy  1998  . Knee arthroscopy  2001  . Rotator cuff repair Right 2/10  . Cataract extraction Bilateral   . Tonsillectomy      as a child  . Colonoscopy    . Radioactive seed implant  2008  . Total hip arthroplasty  08/24/2011    Procedure: TOTAL HIP ARTHROPLASTY;  Surgeon: Lorn Junes, MD;  Location: [place];  Service: Orthopedics;  Laterality: Right;  DR [place]  . Zenker's diverticulectomy  09/02/2011    Procedure: ZENKER'S DIVERTICULECTOMY;  Surgeon: Melida Quitter, MD;  Location: [place];  Service: ENT;  Laterality: N/A;    SOCIAL HISTORY:   History   Social History  . Marital Status: Married    Spouse Name: N/A    Number of Children: 3  . Years of Education: N/A   Occupational History  . retired    Social History Main Topics  . Smoking status: Former Smoker    Quit date: 06/22/1961  . Smokeless tobacco:  Never Used  . Alcohol Use: Yes     Comment: red wine 3-4 times/wk  . Drug Use: No  . Sexual Activity: Not Currently   Other Topics Concern  . Not on file   Social History Narrative  . No narrative on file    FAMILY HISTORY:   Family Status  Relation Status Death Age  . Father Deceased 32  . Mother Deceased 48    old age  . Sister Deceased 87    ROS:  A complete 10 system review of systems was obtained and was unremarkable apart from what is mentioned above.  PHYSICAL EXAMINATION:    VITALS:   Filed Vitals:   12/20/13 0935 12/20/13 0937 12/20/13 0938 12/20/13 0939  BP:  164/90 170/100 108/60  Pulse:  68 68 80  Height: 5\' 8"  (1.727 m)     Weight: 149 lb (67.586 kg)       GEN:  The patient appears stated age and is in NAD. HEENT:  Normocephalic, atraumatic.  The mucous membranes are moist. The superficial temporal arteries are without ropiness or tenderness. CV:  RRR Lungs:  CTAB Neck/HEME:  There are no carotid bruits bilaterally.  Neurological examination:  Orientation: The patient is alert and oriented x3. Fund of knowledge is appropriate.  Recent and remote memory are intact.  Attention and concentration are normal.    Able to name objects and repeat phrases.  Very good historian. Cranial nerves: There is good facial symmetry. There is facial hypomimia. The visual fields are full to confrontational testing. The speech is fluent and clear. There is hypophonic speech.  Soft palate rises symmetrically and there is no tongue deviation. Hearing is intact to conversational tone. Motor: Strength is 5/5 in the bilateral upper and lower  extremities.   Shoulder shrug is equal and symmetric.  There is no pronator drift.   Movement examination: Tone: Normal tone bilaterally. Abnormal movements: No tremor noted today. Coordination:  There is decremation with RAM's, especially on the L Gait and Station: The patient has difficulty arising out of a deep-seated chair without the  use of the hands.  He attempts 3 times, without success and then uses his hands to get out of the chair.  The patient ambulates much better than last visit, although he does shuffle.  He does not need to use the walker, does not become dizzy.  He does festinate.  ASSESSMENT/PLAN:  1.  Parkinsonism.  I suspect that this does represent idiopathic Parkinson's disease.  The patient has tremor, bradykinesia, rigidity and postural instability.  One of the atypical states cannot be ruled out given the [facility] that seems fairly early on in the diagnosis, but given hx wonder if PD wasn't going on longer and just not dx?  -  Will cautiously continue carbidopa/levodopa 25/100 tid. 2.  Significant, symptommatic orthostatic hypotension  --He remains significantly orthostatic in the office today, but actually had no dizziness at all and has not for the last several days.  It was somewhat leery to add further medications today given that blood pressure was fairly elevated in the supine and seated positions.  Ultimately, we decided to just take a wait and see approach.  He really does fairly good in the afternoon, so I wonder if instead of taking Florinef 0.1 mg twice a day as he is currently doing if he should try taking Florinef 0.1 mg, 2 tablets in the morning.  -He was asked to monitor his blood pressure at home  -If florinef no help alone, will consider droxidopa.  We can even add this on to Florinef, if necessary.  Almost 30% of the patients in the study were on Florinef and droxidopa together.  However, there would certainly be a great risk of supine hypertension and risks associated with this, and I discussed these with the patient and daughter  -The patient's daughter thinks that iron deficiency as the cause of the dizziness.  I certainly doubt this, given the amount of systolic blood pressure drops that I see when he stands.  She is now giving him iron, and I told her to be cautious about this as it could cause  significant constipation in patients with Parkinson's disease.  I did give them the rancho recipe in case he needs it.  He has noted that he has not had a bowel movement in several days, but has not necessarily felt constipated.  In the past, he has had problems with diarrhea. 3.  Hx of migraine  -Discontinue Topamax, 25 mg daily. 4.  Follow up 8 weeks.

## 2014-01-04 ENCOUNTER — Telehealth: Payer: Self-pay | Admitting: Neurology

## 2014-01-04 NOTE — Telephone Encounter (Signed)
Called to see if patient's dizziness has gotten any better. He states it was all the time before - now it will be bad one day and okay the next. Blood pressure has been running higher.

## 2014-01-05 NOTE — Telephone Encounter (Signed)
He stated the top number had been running around 160.

## 2014-01-05 NOTE — Telephone Encounter (Signed)
He may want to try talking all florinef in AM instead of bid if not already doing that.  What does he mean by higher (does he mean its running "high" or just that it is no longer low)

## 2014-01-05 NOTE — Telephone Encounter (Signed)
Patient made aware to take florinef in the AM instead of twice daily. He will do this and call if no changes or worse.

## 2014-01-23 ENCOUNTER — Telehealth: Payer: Self-pay | Admitting: Neurology

## 2014-01-23 NOTE — Telephone Encounter (Signed)
I really cannot add more med to help the dizziness if it is due to [facility] when he stands if the BP are this high with sitting/laying.  Raising the levodopa to help freezing will make the dizziness worse.  I think that we need help from cardiology again as the [facility] is something that I need help from them in managing.  When is next appt and can we facilitate him getting that?

## 2014-01-23 NOTE — Telephone Encounter (Signed)
Spoke with daughter and patient's dizziness is getting worse. He has dizzy spells every time he gets up now and is spending about 90-95% of his time sitting or laying down. When sitting - BP's are running in the 200/110 range. He is also having freezing spells when he does get up about 1-2 times daily that can last 20-30 minutes. They want to know if there is anything to do before his appt on 02/05/2014. Please advise.

## 2014-01-23 NOTE — Telephone Encounter (Signed)
Pt's daughter called requesting to speak to a nurse regarding his meds and his current condition. Please call pt's daughter C/B [phone]

## 2014-01-23 NOTE — Telephone Encounter (Signed)
Appt made for 3:00 pm tomorrow with the PA at the Cardiology office. Patient's daughter made aware. Message below was relayed.

## 2014-01-24 ENCOUNTER — Ambulatory Visit: Payer: Medicare Other | Admitting: Nurse Practitioner

## 2014-01-30 ENCOUNTER — Encounter: Payer: Self-pay | Admitting: Cardiology

## 2014-01-30 ENCOUNTER — Ambulatory Visit (INDEPENDENT_AMBULATORY_CARE_PROVIDER_SITE_OTHER): Payer: Medicare Other | Admitting: Cardiology

## 2014-01-30 VITALS — BP 108/68 | HR 75 | Ht 68.0 in | Wt 145.1 lb

## 2014-01-30 DIAGNOSIS — I272 Pulmonary hypertension, unspecified: Secondary | ICD-10-CM

## 2014-01-30 DIAGNOSIS — E785 Hyperlipidemia, unspecified: Secondary | ICD-10-CM

## 2014-01-30 DIAGNOSIS — Z8546 Personal history of malignant neoplasm of prostate: Secondary | ICD-10-CM

## 2014-01-30 DIAGNOSIS — I2789 Other specified pulmonary heart diseases: Secondary | ICD-10-CM

## 2014-01-30 DIAGNOSIS — I951 Orthostatic hypotension: Secondary | ICD-10-CM

## 2014-01-30 DIAGNOSIS — I10 Essential (primary) hypertension: Secondary | ICD-10-CM

## 2014-01-30 LAB — COMPREHENSIVE METABOLIC PANEL
ALT: 5 U/L (ref 0–53)
AST: 21 U/L (ref 0–37)
Albumin: 4.1 g/dL (ref 3.5–5.2)
Alkaline Phosphatase: 41 U/L (ref 39–117)
BUN: 20 mg/dL (ref 6–23)
CO2: 27 mEq/L (ref 19–32)
Calcium: 9.2 mg/dL (ref 8.4–10.5)
Chloride: 99 mEq/L (ref 96–112)
Creatinine, Ser: 1.1 mg/dL (ref 0.4–1.5)
GFR: 65.05 mL/min (ref >60.00)
Glucose, Bld: 104 mg/dL — ABNORMAL HIGH (ref 70–99)
Potassium: 4.4 mEq/L (ref 3.5–5.1)
Sodium: 138 mEq/L (ref 135–145)
Total Bilirubin: 0.8 mg/dL (ref 0.2–1.2)
Total Protein: 6.6 g/dL (ref 6.0–8.3)

## 2014-01-30 LAB — CBC WITH DIFFERENTIAL/PLATELET
Basophils Absolute: 0 10*3/uL (ref 0.0–0.1)
Basophils Relative: 0.2 % (ref 0.0–3.0)
Eosinophils Absolute: 0 10*3/uL (ref 0.0–0.7)
Eosinophils Relative: 0.5 % (ref 0.0–5.0)
HCT: 38.5 % — ABNORMAL LOW (ref 39.0–52.0)
Hemoglobin: 12.8 g/dL — ABNORMAL LOW (ref 13.0–17.0)
Lymphocytes Relative: 12.7 % (ref 12.0–46.0)
Lymphs Abs: 1 10*3/uL (ref 0.7–4.0)
MCHC: 33.2 g/dL (ref 30.0–36.0)
MCV: 96.2 fl (ref 78.0–100.0)
Monocytes Absolute: 0.5 10*3/uL (ref 0.1–1.0)
Monocytes Relative: 6.8 % (ref 3.0–12.0)
Neutro Abs: 6 10*3/uL (ref 1.4–7.7)
Neutrophils Relative %: 79.8 % — ABNORMAL HIGH (ref 43.0–77.0)
Platelets: 239 10*3/uL (ref 150.0–400.0)
RBC: 4 Mil/uL — ABNORMAL LOW (ref 4.22–5.81)
RDW: 13.5 % (ref 11.5–15.5)
WBC: 7.6 10*3/uL (ref 4.0–10.5)

## 2014-01-30 MED ORDER — FLUDROCORTISONE ACETATE 0.1 MG PO TABS: 0.1000 mg | ORAL_TABLET | Freq: Every day | ORAL | Status: DC

## 2014-01-30 MED ORDER — METOPROLOL SUCCINATE 12.5 MG HALF TABLET: 12.5000 mg | ORAL_TABLET | Freq: Two times a day (BID) | ORAL | Status: DC

## 2014-01-30 NOTE — Patient Instructions (Signed)
Your physician has recommended you make the following change in your medication:   DECREASE YOUR FLUDROCORTISONE TO 0.1 MG ONCE DAILY  START TAKING METOPROLOL 12.5MG  TWICE DAILY  Your physician recommends that you return for lab work in: TODAY - (CMET, AND CBC)  Your physician has requested that you have an echocardiogram. Echocardiography is a painless test that uses sound waves to create images of your heart. It provides your doctor with information about the size and shape of your heart and how well your heart's chambers and valves are working. This procedure takes approximately one hour. There are no restrictions for this procedure.  Your physician recommends that you schedule a follow-up appointment in: 4 MONTHS WITH DR NELSON  PLEASE CALL us IN ONE WEEK TO LET us KNOW HOW YOU ARE FEELING

## 2014-01-30 NOTE — Progress Notes (Signed)
Patient ID: ARTYOM STENCEL, male   DOB: 03-01-1924, 80 y.o.   MRN: [id number]    Patient Name: Darius Castro Date of Encounter: 01/30/2014  Primary Care Provider:  [name], MD Primary Cardiologist:  Dorothy Spark (previously Dr Verl Blalock)   Problem List   Past Medical History  Diagnosis Date  . Hyperlipidemia     takes Lipitor daily  . Adenomatous colon polyp 3/09  . Personal history of prostate cancer   . Dysphagia     with pills  . Hypertension     takes Fosinopril daily  . Sleep apnea     sleep study done in 03/30/04 in epic;doesn't use a CPAP  . Pneumonia     hx of as a child  . Arthritis     back and hip  . Dizziness     pt states when he is hot and stands up too fast  . Back pain     hx of ruptured disc  . Dry skin   . Hemorrhoid     internal   . Hx of colonic polyps   . Nocturia   . Prostate cancer   . Impaired hearing   . 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 Diabetes mellitus     patient denies Diabetes   Past Surgical History  Procedure Laterality Date  . Lumbar laminectomy  1998  . Knee arthroscopy  2001  . Rotator cuff repair Right 2/10  . Cataract extraction Bilateral   . Tonsillectomy      as a child  . Colonoscopy    . Radioactive seed implant  2008  . Total hip arthroplasty  08/24/2011    Procedure: TOTAL HIP ARTHROPLASTY;  Surgeon: Lorn Junes, MD;  Location: [facility];  Service: Orthopedics;  Laterality: Right;  DR [place]  . Zenker's diverticulectomy  09/02/2011    Procedure: ZENKER'S DIVERTICULECTOMY;  Surgeon: Melida Quitter, MD;  Location: [facility];  Service: ENT;  Laterality: N/A;    Allergies  No Known Allergies  HPI  A 80 year old male with h/o hyperlipidemia, hypertension who is coming with the main concern of dizziness on a daily basis especially when he stands up too fast. Going off the BP med 2 wks ago is not what is making him dizzy, he states this has been going on 2 yrs, they think it  could be his migraine med which on the bottle states it could cause dizziness topiramate.  Pt stays dizzy all the time. Even when he goes to exercise, he has to sit down cause he's dizzy.   He reports no falls, no palpitations, chest pain or shortness of breath. No syncope.  He is currently taking fludrocortisone 0.1 mg daily.  Orthostatics today: Lying: 181/83, HR 73 Sitting: 152/79, HR 75 Standing: 104/55, HR 73   Stop ACE inhibitor at the last visit and started beta blocker. However patient states that his symptoms got even worse. He brings a letter from his physical therapist that your not able to perform any therapy as he feels dizzy every time he stands up.  01/30/2014 - at the last visit we stopped the beta blockers and And increased fludrocortisone to 0.2 mg daily. His BP got to 160 at rest and 190-200 during exertion. We decreased fludrocortisone to 0.1 mg daily 2 weeks ago and he started to feel significantly better however his blood pressure still goes up when he tries to exert himself. We will add metoprolol  6.25 mg twice a day. The family is concerned about anemia. Last hemoglobin was 12.5  Home Medications  Prior to Admission medications   Medication Sig Start Date End Date Taking? Authorizing Provider  aspirin 325 MG tablet Take 325 mg by mouth daily.   Yes Historical Provider, MD  carbidopa-levodopa (SINEMET IR) 25-100 MG per tablet Take 1 tablet by mouth 3 (three) times daily. 08/23/13  Yes Historical Provider, MD  fludrocortisone (FLORINEF) 0.1 MG tablet Take 1 tablet (0.1 mg total) by mouth daily. 10/03/13  Yes Rebecca S Tat, DO  fosinopril (MONOPRIL) 20 MG tablet  08/23/13  Yes Historical Provider, MD  polycarbophil (FIBERCON) 625 MG tablet Take 625 mg by mouth daily.   Yes Historical Provider, MD  topiramate (TOPAMAX) 25 MG tablet Take 25 mg by mouth daily.    Yes Historical Provider, MD    Family History  Family History  Problem Relation Age of Onset  . Heart disease  Father   . Heart attack Father 61  . Colon polyps Daughter   . Colon cancer Neg Hx   . Anesthesia problems Neg Hx   . Hypotension Neg Hx   . Malignant hyperthermia Neg Hx   . Pseudochol deficiency Neg Hx   . Hypertension Mother   . Cancer Sister     Social History  History   Social History  . Marital Status: Married    Spouse Name: N/A    Number of Children: 3  . Years of Education: N/A   Occupational History  . retired    Social History Main Topics  . Smoking status: Former Smoker    Quit date: 06/22/1961  . Smokeless tobacco: Never Used  . Alcohol Use: Yes     Comment: red wine 3-4 times/wk  . Drug Use: No  . Sexual Activity: Not Currently   Other Topics Concern  . Not on file   Social History Narrative  . No narrative on file     Review of Systems, as per HPI, otherwise negative General:  No chills, fever, night sweats or weight changes.  Cardiovascular:  No chest pain, dyspnea on exertion, edema, orthopnea, palpitations, paroxysmal nocturnal dyspnea. Dermatological: No rash, lesions/masses Respiratory: No cough, dyspnea Urologic: No hematuria, dysuria Abdominal:   No nausea, vomiting, diarrhea, bright red blood per rectum, melena, or hematemesis Neurologic:  No visual changes, wkns, changes in mental status. All other systems reviewed and are otherwise negative except as noted above.  Physical Exam  Blood pressure 108/68, pulse 75, height 5\' 8"  (1.727 m), weight 145 lb 1.9 oz (65.826 kg).  General: Pleasant, NAD Psych: Normal affect. Neuro: Alert and oriented X 3. Moves all extremities spontaneously. HEENT: Normal  Neck: Supple without bruits or JVD. Lungs:  Resp regular and unlabored, CTA. Heart: RRR no s3, s4, or 2/6 systolic murmur at the LLSB. Abdomen: Soft, non-tender, non-distended, BS + x 4.  Extremities: No clubbing, cyanosis or edema. DP/PT/Radials 2+ and equal bilaterally.  Labs:  No results found for this basename: CKTOTAL, CKMB,  TROPONINI,  in the last 72 hours Lab Results  Component Value Date   WBC 6.5 11/15/2013   HGB 12.5* 11/15/2013   HCT 37.6* 11/15/2013   MCV 95.2 11/15/2013   PLT 230 11/15/2013    Lab Results  Component Value Date   DDIMER 0.73* 05/30/2012   No components found with this basename: POCBNP,     Component Value Date/Time   NA 139 11/20/2013 1042   K 3.6 11/20/2013 1042  CL 103 11/20/2013 1042   CO2 29 11/20/2013 1042   GLUCOSE 80 11/20/2013 1042   BUN 16 11/20/2013 1042   CREATININE 1.0 11/20/2013 1042   CALCIUM 8.9 11/20/2013 1042   PROT 6.5 11/15/2013 1230   ALBUMIN 4.0 11/15/2013 1230   AST 18 11/15/2013 1230   ALT 15 11/15/2013 1230   ALKPHOS 51 11/15/2013 1230   BILITOT 0.5 11/15/2013 1230   GFRNONAA 72* 11/15/2013 1230   GFRAA 84* 11/15/2013 1230   Lab Results  Component Value Date   CHOL 155 05/30/2012   HDL 68.90 05/30/2012   LDLCALC 75 05/30/2012   TRIG 57.0 05/30/2012    Accessory Clinical Findings  Echocardiogram - 2012 Study Conclusions  - Left ventricle: The cavity size was normal. There was mild focal basal hypertrophy of the septum. Systolic function was normal. The estimated ejection fraction was in the range of 55% to 65%. Wall motion was normal; there were no regional wall motion abnormalities. Doppler parameters are consistent with abnormal left ventricular relaxation (grade 1 diastolic dysfunction). - Aortic valve: Trivial regurgitation. - Mitral valve: Mild regurgitation. - Tricuspid valve: Moderate regurgitation. - Pulmonary arteries: Systolic pressure was moderately increased. PA peak pressure: 21mm Hg (S).  ECG - SR, PACs, nonspecific T wave abnormalities   Assessment & Plan  80 year old male   1. Orthostatic hypotension - d/c ACEI, we will discontinued metoprolol  And increased fludrocortisone to 0.2 mg daily. His BP got to 160 at rest and 190-200 during exertion. We decreased fludrocortisone to 0.1 mg daily 2 weeks ago and he started to feel significantly  better however his blood pressure still goes up when he tries to exert himself. We will add metoprolol 6.25 mg twice a day.  2. Chronic CHF with preserved LVEF - stable, asymptomatic  3. Pulmonary HTN - repeat echocardiogram   4. history of anemia - the family is concerned about low hemoglobin contributing to his symptoms, they were giving him iron pills. His last hemoglobin was 12.5 in May 2015. We will check CBC and CMP today.  Will call patient in one week to see how her his symptoms. Followup in 4 months.   Dorothy Spark, MD, [facility] 01/30/2014, 8:28 AM

## 2014-01-31 ENCOUNTER — Telehealth: Payer: Self-pay | Admitting: *Deleted

## 2014-01-31 NOTE — Telephone Encounter (Signed)
Contacted pt to inform him that per Dr Meda Coffee his labs were normal.  Pt had normal electrolytes, kidney and liver function. Hb 12.8.  Pt verbalized understanding and pleased with these results.

## 2014-01-31 NOTE — Telephone Encounter (Signed)
FYI: Patients daughter called to reschedule his appointment for 03-26-14. She states that he was just seen at the heart dr.on yesterday and started some new medication that would take about 6 to 7 weeks to show effects.

## 2014-01-31 NOTE — Telephone Encounter (Signed)
Message copied by Nuala Alpha on Wed Jan 31, 2014  8:18 AM ------      Message from: Dorothy Spark      Created: Tue Jan 30, 2014  5:14 PM       Normal electrolytes, kidney and liver function. Hb 12.8 today. Please let them know.      Thank you,      K ------

## 2014-02-05 ENCOUNTER — Ambulatory Visit: Payer: Medicare Other | Admitting: Neurology

## 2014-02-05 ENCOUNTER — Telehealth: Payer: Self-pay | Admitting: Cardiology

## 2014-02-05 NOTE — Telephone Encounter (Signed)
New message     Pt was seen on 8-11.  She need test results

## 2014-02-05 NOTE — Telephone Encounter (Signed)
Pts Daughter Rise Paganini informed about pts recent labs from 8-11.  Informed Daughter that per Dr Meda Coffee the pt had normal electrolytes, kidney and liver function. Hb 12.8 today.  Pts Daughter verbalized understanding and pleased with this news.

## 2014-02-12 ENCOUNTER — Encounter: Payer: Self-pay | Admitting: Physician Assistant

## 2014-02-12 ENCOUNTER — Ambulatory Visit (INDEPENDENT_AMBULATORY_CARE_PROVIDER_SITE_OTHER): Payer: Medicare Other | Admitting: Physician Assistant

## 2014-02-12 ENCOUNTER — Ambulatory Visit (HOSPITAL_COMMUNITY): Payer: Medicare Other | Attending: Cardiology | Admitting: Cardiology

## 2014-02-12 VITALS — BP 120/88 | HR 60 | Temp 98.3°F | Resp 18 | Wt 150.0 lb

## 2014-02-12 DIAGNOSIS — H6120 Impacted cerumen, unspecified ear: Secondary | ICD-10-CM

## 2014-02-12 DIAGNOSIS — I10 Essential (primary) hypertension: Secondary | ICD-10-CM

## 2014-02-12 DIAGNOSIS — Z8546 Personal history of malignant neoplasm of prostate: Secondary | ICD-10-CM

## 2014-02-12 DIAGNOSIS — E785 Hyperlipidemia, unspecified: Secondary | ICD-10-CM

## 2014-02-12 DIAGNOSIS — I509 Heart failure, unspecified: Secondary | ICD-10-CM

## 2014-02-12 DIAGNOSIS — I951 Orthostatic hypotension: Secondary | ICD-10-CM

## 2014-02-12 DIAGNOSIS — I272 Pulmonary hypertension, unspecified: Secondary | ICD-10-CM

## 2014-02-12 DIAGNOSIS — H6123 Impacted cerumen, bilateral: Secondary | ICD-10-CM

## 2014-02-12 NOTE — Progress Notes (Signed)
Subjective:    Patient ID: Darius Castro, male    DOB: 10/05/1923, 80 y.o.   MRN: [id number]  HPI Patient is a 80 y.o. male presenting for cerumen impaction. Patient is currently being treated by cardiology for chronic dizziness due to orthostatic hypotension. He is currently being treated with both metoprolol and fludrocortisone in order to try and maintain his blood pressure at an acceptable level. Despite the attempts to manage his orthostatic hypotension and symptoms of dizziness, he is still having breakthrough dizziness intermittently most often whenever he stands up which she describes as lightheadedness. The patient states that he was told by someone with cardiology that occasionally the symptoms could actually be a result of earwax buildup. It was then suggested that he see if potentially he needed to have his ears irrigated, prompting his visit to the office today. The patient denies any discharge or drainage from his ears, and denies any significant change in his hearing. He also denies any ear pain, fullness, and denies vertigo. Patient denies fevers, chills, nausea, vomiting, diarrhea, shortness of breath, chest pain, headache, syncope.    Review of Systems As per HPI and are otherwise negative.   Past Medical History  Diagnosis Date  . Hyperlipidemia     takes Lipitor daily  . Adenomatous colon polyp 3/09  . Personal history of prostate cancer   . Dysphagia     with pills  . Hypertension     takes Fosinopril daily  . Sleep apnea     sleep study done in 03/30/04 in epic;doesn't use a CPAP  . Pneumonia     hx of as a child  . Arthritis     back and hip  . Dizziness     pt states when he is hot and stands up too fast  . Back pain     hx of ruptured disc  . Dry skin   . Hemorrhoid     internal   . Hx of colonic polyps   . Nocturia   . Prostate cancer   . Impaired hearing   . 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 Diabetes mellitus     patient denies  Diabetes    History   Social History  . Marital Status: Married    Spouse Name: N/A    Number of Children: 3  . Years of Education: N/A   Occupational History  . retired    Social History Main Topics  . Smoking status: Former Smoker    Quit date: 06/22/1961  . Smokeless tobacco: Never Used  . Alcohol Use: Yes     Comment: red wine 3-4 times/wk  . Drug Use: No  . Sexual Activity: Not Currently   Other Topics Concern  . Not on file   Social History Narrative  . No narrative on file    Past Surgical History  Procedure Laterality Date  . Lumbar laminectomy  1998  . Knee arthroscopy  2001  . Rotator cuff repair Right 2/10  . Cataract extraction Bilateral   . Tonsillectomy      as a child  . Colonoscopy    . Radioactive seed implant  2008  . Total hip arthroplasty  08/24/2011    Procedure: TOTAL HIP ARTHROPLASTY;  Surgeon: Lorn Junes, MD;  Location: [facility];  Service: Orthopedics;  Laterality: Right;  DR [place]  . Zenker's diverticulectomy  09/02/2011    Procedure: ZENKER'S DIVERTICULECTOMY;  Surgeon: Melida Quitter, MD;  Location: [facility] OR;  Service: ENT;  Laterality: N/A;    Family History  Problem Relation Age of Onset  . Heart disease Father   . Heart attack Father 10  . Colon polyps Daughter   . Colon cancer Neg Hx   . Anesthesia problems Neg Hx   . Hypotension Neg Hx   . Malignant hyperthermia Neg Hx   . Pseudochol deficiency Neg Hx   . Hypertension Mother   . Cancer Sister     No Known Allergies  Current Outpatient Prescriptions on File Prior to Visit  Medication Sig Dispense Refill  . aspirin 325 MG tablet Take 325 mg by mouth daily.      . carbidopa-levodopa (SINEMET IR) 25-100 MG per tablet Take 1 tablet by mouth 3 (three) times daily.      . Coenzyme Q10 (CO Q-10) 100 MG CAPS Take 100 mg by mouth 3 (three) times daily.      . ferrous sulfate 325 (65 FE) MG tablet Take 325 mg by mouth daily with breakfast.      .  fludrocortisone (FLORINEF) 0.1 MG tablet Take 1 tablet (0.1 mg total) by mouth daily. Pt states that our office called him & told him take only 1 tablet once a day (01/30/14)  90 tablet  6  . metoprolol succinate (TOPROL-XL) 12.5 mg TB24 24 hr tablet Take 0.5 tablets (12.5 mg total) by mouth 2 (two) times daily.  120 each  6  . Multiple Vitamins-Minerals (CENTRUM SILVER ULTRA WOMENS PO) Take 1 tablet by mouth daily.      . polycarbophil (FIBERCON) 625 MG tablet Take 625 mg by mouth daily.      . potassium chloride (K-DUR) 10 MEQ tablet Take 1 tablet (10 mEq total) by mouth daily.  90 tablet  3  . vitamin E 400 UNIT capsule Take 400 Units by mouth daily.       Current Facility-Administered Medications on File Prior to Visit  Medication Dose Route Frequency Provider Last Rate Last Dose  . povidone-iodine (BETADINE) 7.5 % scrub   Topical Once Kirstin J Shepperson, PA-C        EXAM: BP 120/88  Pulse 60  Temp(Src) 98.3 F (36.8 C) (Oral)  Resp 18  Wt 150 lb (68.04 kg)     Objective:   Physical Exam  Nursing note and vitals reviewed. Constitutional: He is oriented to person, place, and time. He appears well-developed and well-nourished. No distress.  HENT:  Head: Normocephalic and atraumatic.  Nose: Nose normal.  Mouth/Throat: Oropharynx is clear and moist. No oropharyngeal exudate.  Bilateral ear canals have a moderate amount of cerumen present, however neither our occluding the ear canals. Lavaged in office. Bilateral TMs appear normal.  Eyes: Conjunctivae and EOM are normal. Pupils are equal, round, and reactive to light.  Cardiovascular: Normal rate, regular rhythm and intact distal pulses.   Pulmonary/Chest: Effort normal and breath sounds normal. No respiratory distress. He has no wheezes. He has no rales. He exhibits no tenderness.  Musculoskeletal: He exhibits no edema.  Neurological: He is alert and oriented to person, place, and time.  Skin: Skin is warm and dry. He is not  diaphoretic.  Psychiatric: He has a normal mood and affect. His behavior is normal. Judgment and thought content normal.     Lab Results  Component Value Date   WBC 7.6 01/30/2014   HGB 12.8* 01/30/2014   HCT 38.5* 01/30/2014   PLT 239.0 01/30/2014   GLUCOSE 104* 01/30/2014  CHOL 155 05/30/2012   TRIG 57.0 05/30/2012   HDL 68.90 05/30/2012   LDLCALC 75 05/30/2012   ALT 5 01/30/2014   AST 21 01/30/2014   NA 138 01/30/2014   K 4.4 01/30/2014   CL 99 01/30/2014   CREATININE 1.1 01/30/2014   BUN 20 01/30/2014   CO2 27 01/30/2014   TSH 1.29 10/31/2013   PSA 0.06* 10/03/2010   INR 1.19 08/29/2011   HGBA1C 6.2 05/30/2012   MICROALBUR 1.7 07/29/2007        Assessment & Plan:  Darius Castro was seen today for ear irrigation.  Diagnoses and associated orders for this visit:  Excessive cerumen in both ear canals Comments: Discourage Q-tip use. Encourage OTC Debrox solution to prevent cerumen impaction.    Patient states that his next appointment with cardiology is in December. Encourage patient to check his blood pressure daily as directed, maintain fluid hydration, and to find out exactly what blood pressure parameters the cardiologist wishes for him to maintain. Will have patient followup in about 2 months with PCP to reassess blood pressure to bridge appointment with cardiology.  Return precautions provided, and patient handout on cerumen impaction and orthostatic hypotension.  Plan to follow up in about 2 months with PCP to reassess, or for worsening or persistent symptoms despite treatment.  Patient Instructions  Continue to monitor your blood pressure daily.  Contact cardiology to verify the exact blood pressure parameters that they want you to maintain.  Keep your followup as scheduled with cardiology.  Maintain adequate fluid hydration, and utilize compression stockings to prevent any leg swelling.  In the future to prevent earwax buildup, and use Debrox over-the-counter earwax cleaning  solution.  If emergency symptoms discussed during visit developed, seek medical attention immediately.  Followup in about 2 months with PCP to reassess, or for worsening or persistent symptoms despite treatment.

## 2014-02-12 NOTE — Progress Notes (Signed)
Echo performed. 

## 2014-02-12 NOTE — Patient Instructions (Addendum)
Continue to monitor your blood pressure daily.  Contact cardiology to verify the exact blood pressure parameters that they want you to maintain.  Keep your followup as scheduled with cardiology.  Maintain adequate fluid hydration, and utilize compression stockings to prevent any leg swelling.  In the future to prevent earwax buildup, and use Debrox over-the-counter earwax cleaning solution.  If emergency symptoms discussed during visit developed, seek medical attention immediately.  Followup in about 2 months with PCP to reassess, or for worsening or persistent symptoms despite treatment.    Orthostatic Hypotension Orthostatic hypotension is a sudden drop in blood pressure. It happens when you quickly stand up from a seated or lying position. You may feel dizzy or light-headed. This can last for just a few seconds or for up to a few minutes. It is usually not a serious problem. However, if this happens frequently or gets worse, it can be a sign of something more serious. CAUSES  Different things can cause orthostatic hypotension, including:   Loss of body fluids (dehydration).  Medicines that lower blood pressure.  Sudden changes in posture, such as standing up quickly after you have been sitting or lying down.  Taking too much of your medicine. SIGNS AND SYMPTOMS   Light-headedness or dizziness.   Fainting or near-fainting.   A fast heart rate.   Weakness.   Feeling tired (fatigue).  DIAGNOSIS  Your health care provider may do several things to help diagnose your condition and identify the cause. These may include:   Taking a medical history and doing a physical exam.  Checking your blood pressure. Your health care provider will check your blood pressure when you are:  Lying down.  Sitting.  Standing.  Using tilt table testing. In this test, you lie down on a table that moves from a lying position to a standing position. You will be strapped onto the table.  This test monitors your blood pressure and heart rate when you are in different positions. TREATMENT  Treatment will vary depending on the cause. Possible treatments include:   Changing the dosage of your medicines.  Wearing compression stockings on your lower legs.  Standing up slowly after sitting or lying down.  Eating more salt.  Eating frequent, small meals.  In some cases, getting IV fluids.  Taking medicine to enhance fluid retention. HOME CARE INSTRUCTIONS  Only take over-the-counter or prescription medicines as directed by your health care provider.  Follow your health care provider's instructions for changing the dosage of your current medicines.  Do not stop or adjust your medicine on your own.  Stand up slowly after sitting or lying down. This allows your body to adjust to the different position.  Wear compression stockings as directed.  Eat extra salt as directed.  Do not add extra salt to your diet unless directed to by your health care provider.  Eat frequent, small meals.  Avoid standing suddenly after eating.  Avoid hot showers or excessive heat as directed by your health care provider.  Keep all follow-up appointments. SEEK MEDICAL CARE IF:  You continue to feel dizzy or light-headed after standing.  You feel groggy or confused.  You feel cold, clammy, or sick to your stomach (nauseous).  You have blurred vision.  You feel short of breath. SEEK IMMEDIATE MEDICAL CARE IF:   You faint after standing.  You have chest pain.  You have difficulty breathing.   You lose feeling or movement in your arms or legs.   You  have slurred speech or difficulty talking, or you are unable to talk.  MAKE SURE YOU:   Understand these instructions.  Will watch your condition.  Will get help right away if you are not doing well or get worse. Document Released: 05/29/2002 Document Revised: 06/13/2013 Document Reviewed: 03/31/2013 [facility] Patient  Information 2015 [place], [place]. This information is not intended to replace advice given to you by your health care provider. Make sure you discuss any questions you have with your health care provider. Cerumen Impaction A cerumen impaction is when the wax in your ear forms a plug. This plug usually causes reduced hearing. Sometimes it also causes an earache or dizziness. Removing a cerumen impaction can be difficult and painful. The wax sticks to the ear canal. The canal is sensitive and bleeds easily. If you try to remove a heavy wax buildup with a cotton tipped swab, you may push it in further. Irrigation with water, suction, and small ear curettes may be used to clear out the wax. If the impaction is fixed to the skin in the ear canal, ear drops may be needed for a few days to loosen the wax. People who build up a lot of wax frequently can use ear wax removal products available in your local drugstore. SEEK MEDICAL CARE IF:  You develop an earache, increased hearing loss, or marked dizziness. Document Released: 07/16/2004 Document Revised: 08/31/2011 Document Reviewed: 09/05/2009 [facility] Patient Information 2015 [place], [place]. This information is not intended to replace advice given to you by your health care provider. Make sure you discuss any questions you have with your health care provider.

## 2014-02-13 ENCOUNTER — Telehealth: Payer: Self-pay | Admitting: Cardiology

## 2014-02-13 NOTE — Telephone Encounter (Signed)
New message      What is the highest and lowest range for her dad's bp?  What is too high and what is too low?

## 2014-02-13 NOTE — Telephone Encounter (Signed)
Pt education provided to Daughter about normal BP readings for lows and highs for geriatric pts.  Also informed Daughter that based on the pts BP from yesterday at PCP, all his vitals were WNL.  Daughter verbalized understanding and pleased with all the assistance provided.

## 2014-03-02 ENCOUNTER — Other Ambulatory Visit: Payer: Self-pay | Admitting: Internal Medicine

## 2014-03-02 DIAGNOSIS — M25512 Pain in left shoulder: Secondary | ICD-10-CM

## 2014-03-26 ENCOUNTER — Ambulatory Visit (INDEPENDENT_AMBULATORY_CARE_PROVIDER_SITE_OTHER): Payer: Medicare Other

## 2014-03-26 ENCOUNTER — Ambulatory Visit (INDEPENDENT_AMBULATORY_CARE_PROVIDER_SITE_OTHER): Payer: Medicare Other | Admitting: Neurology

## 2014-03-26 ENCOUNTER — Encounter: Payer: Self-pay | Admitting: Neurology

## 2014-03-26 VITALS — BP 110/58 | HR 71 | Ht 68.0 in | Wt 148.0 lb

## 2014-03-26 DIAGNOSIS — K592 Neurogenic bowel, not elsewhere classified: Secondary | ICD-10-CM | POA: Insufficient documentation

## 2014-03-26 DIAGNOSIS — G20 Parkinson's disease: Secondary | ICD-10-CM

## 2014-03-26 DIAGNOSIS — I10 Essential (primary) hypertension: Secondary | ICD-10-CM

## 2014-03-26 DIAGNOSIS — I951 Orthostatic hypotension: Secondary | ICD-10-CM

## 2014-03-26 DIAGNOSIS — K5900 Constipation, unspecified: Secondary | ICD-10-CM

## 2014-03-26 DIAGNOSIS — Z23 Encounter for immunization: Secondary | ICD-10-CM

## 2014-03-26 NOTE — Progress Notes (Signed)
Darius Castro was seen today in the movement disorders clinic for neurologic consultation at the request of [name], MD.  The consultation is for the evaluation of tremor and to r/o ET vs PD.  This patient is accompanied in the office by his spouse who supplements the history.  Pt reports a one year hx of tremor in the RUE.  He notes it most when he is just walking or at rest.  He will sometimes notice it when using it.  He is R hand dominant.  His wife also noted   Specific Symptoms:  Tremor: yes (R hand only)  -Affected by caffeine:  no  Affected by alcohol: unknown  Affected by stress:  no  Affected by fatigue:  no  Spills soup if on spoon:  no  Spills glass of liquid if full:  no  Affects ADL's (tying shoes, brushing teeth, etc):  no Voice: hypophonic and decreased ability to understand pt Sleep: sleeps well  Vivid Dreams:  no  Acting out dreams:  no Wet Pillows: no Postural symptoms:  yes (shuffling x 1 year and getting worse)  Falls?  yes (1-2 total and last was few months ago - doesn't recall exact circumstance) Bradykinesia symptoms: shuffling gait, slow movements, difficulty getting out of a chair and difficulty regaining balance Loss of smell:  yes Loss of taste:  no Urinary Incontinence:  yes (rarely and just leakage) Difficulty Swallowing:  no Handwriting, micrographia: yes Trouble with ADL's:  no  Trouble buttoning clothing: no Depression:  no Memory changes:  no (still does driving/finances) Hallucinations:  no  visual distortions: no N/V:  no Lightheaded:  yes  Syncope: no Diplopia:  no Dyskinesia:  no  Neuroimaging has previously been performed.  MRI brain was performed in 2008 demonstrating atrophy and mild small vessel disease.  It is available for my review today.  Pt saw Dr. Brett Fairy in 2008 for diplopia and was dx with migraine and was started on topamax.  Was free of visual changes for a year and then restarted and increased med to 25 mg -  2 po bid.  Had SE with the medication (tired, dizzy) and went back on topamax 25 mg bid 2 weeks ago after bad migraine (b/l frontal, cannot describe quality, no vision change - took advil with quick relief)  10/03/13: The patient is following up in Parkinson's disease, which was just diagnosed on 08/23/2013.  This patient is accompanied in the office by his spouse who supplements the history.  At that time I started him on levodopa. Pt is not sure the carbidopa/levodopa 25/100 is helping.  I did review notes available to me since last visit.  After our last visit, he immediately went to his primary care physician because of orthostatic hypotension.  His Monopril was decreased.  He went to the emergency room on 09/14/2013.  He had fallen after becoming dizzy.  There was no associated loss of consciousness.  He did end up fracturing his nasal bone.  In the end, his blood pressure medicine was discontinued.  Despite this, he continued to have dizziness that has been disabling. Pt feels rather insistent that this is not from the levodopa, as it has been going on for 2 years.  Occasional diplopia, lasting 3-4 minutes.  Is always with the dizziness.    11/20/13 update:  The patient is following up today, accompanied by his daughter and son-in-law who supplements the history.  The patient has seen cardiology and his primary  care physician today.  Records were reviewed since last visit.  Last visit, I started him on Florinef.  I also requested a cardiology consult.  They initially discontinued his Monopril because of a vasodilatory properties of ACE inhibitors and changed it to metoprolol 25 mg twice a day.  The patient went to the emergency room on May 27 because of a fall after near syncopal episode after getting out of a car.  The patient felt dizzy but did not actually lose consciousness.  He followed up with cardiology today and they discontinued his metoprolol and increased the Florinef to 0.2 mg.  I also received  notes from the patient's physical therapist stating that the patient will have bouts of dizziness that lasts up to 10 seconds and he will have to sit in order to not fall.  There have been no hallucinations.  No headaches.  12/20/13 update:  The patient is seen back in followup for his Parkinson's disease.  He is accompanied by his daughter who supplements the history.  He is currently on carbidopa/levodopa 25/100, one tablet 3 times per day.  Last visit, his biggest difficulty was orthostatic hypotension.  His Florinef was increased and metoprolol was decreased.  He sent me a series of blood pressure are observations, and most of those were quite good.  Today, the patient states that he is no longer dizzy, but his daughter disagrees.  She does give me a log that the patient has been keeping.  Often times, he has dizziness in the morning until approximately 10 AM or 11 AM and then he does well.  For the last 3 days he has not been dizzy at all.  She states that the physical therapists have told her that they have put therapy on hold because he could not participate because of dizziness.  The patient reports feeling much better than prior to last visit.  No headaches.  No hallucinations.  No near syncope.  03/26/14 update:  The patient was seen in follow up today, accompanied by his daughter who supplements the history.  In addition, had the opportunity to review records made available to me, specifically his cardiology records.  He remains on carbidopa/levodopa 25/100 3 times per day.  He states he needed to 7 AM/2 PM/6 PM.  I discontinued his Topamax for migraine last visit.  He has had no further headaches.  He has been struggling with the management of orthostatic hypotension, complicated by hypertension when he exerts himself.  Cardiology had back low dose metoprolol, 6.25 mg twice per day and slightly dropped his Florinef.  He is also drinking much more water and the BP is much better.  He brought a BP og with  him today and BP's are much better and the dizziness is basically resolved.  He does state that he became faint this AM and ended up falling because he couldn't sit fast enough.  He didn't get hurt.  This was the first time it happened in a long time.  PREVIOUS MEDICATIONS: none to date  ALLERGIES:  No Known Allergies  CURRENT MEDICATIONS:  Current Outpatient Prescriptions on File Prior to Visit  Medication Sig Dispense Refill  . aspirin 325 MG tablet Take 325 mg by mouth daily.      . carbidopa-levodopa (SINEMET IR) 25-100 MG per tablet Take 1 tablet by mouth 3 (three) times daily.      . Coenzyme Q10 (CO Q-10) 100 MG CAPS Take 100 mg by mouth 3 (three) times daily.      Marland Kitchen  ferrous sulfate 325 (65 FE) MG tablet Take 325 mg by mouth daily with breakfast.      . fludrocortisone (FLORINEF) 0.1 MG tablet Take 1 tablet (0.1 mg total) by mouth daily. Pt states that our office called him & told him take only 1 tablet once a day (01/30/14)  90 tablet  6  . metoprolol succinate (TOPROL-XL) 12.5 mg TB24 24 hr tablet Take 0.5 tablets (12.5 mg total) by mouth 2 (two) times daily.  120 each  6  . Multiple Vitamins-Minerals (CENTRUM SILVER ULTRA WOMENS PO) Take 1 tablet by mouth daily.      . polycarbophil (FIBERCON) 625 MG tablet Take 625 mg by mouth daily.      . potassium chloride (K-DUR) 10 MEQ tablet Take 1 tablet (10 mEq total) by mouth daily.  90 tablet  3  . vitamin E 400 UNIT capsule Take 400 Units by mouth daily.       Current Facility-Administered Medications on File Prior to Visit  Medication Dose Route Frequency Provider Last Rate Last Dose  . povidone-iodine (BETADINE) 7.5 % scrub   Topical Once Kirstin Earl Lagos, PA-C        PAST MEDICAL HISTORY:   Past Medical History  Diagnosis Date  . Hyperlipidemia     takes Lipitor daily  . Adenomatous colon polyp 3/09  . Personal history of prostate cancer   . Dysphagia     with pills  . Hypertension     takes Fosinopril daily  . Sleep  apnea     sleep study done in 03/30/04 in epic;doesn't use a CPAP  . Pneumonia     hx of as a child  . Arthritis     back and hip  . Dizziness     pt states when he is hot and stands up too fast  . Back pain     hx of ruptured disc  . Dry skin   . Hemorrhoid     internal   . Hx of colonic polyps   . Nocturia   . Prostate cancer   . Impaired hearing   . 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 Diabetes mellitus     patient denies Diabetes    PAST SURGICAL HISTORY:   Past Surgical History  Procedure Laterality Date  . Lumbar laminectomy  1998  . Knee arthroscopy  2001  . Rotator cuff repair Right 2/10  . Cataract extraction Bilateral   . Tonsillectomy      as a child  . Colonoscopy    . Radioactive seed implant  2008  . Total hip arthroplasty  08/24/2011    Procedure: TOTAL HIP ARTHROPLASTY;  Surgeon: Lorn Junes, MD;  Location: [facility];  Service: Orthopedics;  Laterality: Right;  DR [place]  . Zenker's diverticulectomy  09/02/2011    Procedure: ZENKER'S DIVERTICULECTOMY;  Surgeon: Melida Quitter, MD;  Location: [facility];  Service: ENT;  Laterality: N/A;    SOCIAL HISTORY:   History   Social History  . Marital Status: Married    Spouse Name: N/A    Number of Children: 3  . Years of Education: N/A   Occupational History  . retired    Social History Main Topics  . Smoking status: Former Smoker    Quit date: 06/22/1961  . Smokeless tobacco: Never Used  . Alcohol Use: Yes     Comment: red wine 3-4 times/wk  . Drug Use: No  . Sexual Activity: Not  Currently   Other Topics Concern  . Not on file   Social History Narrative  . No narrative on file    FAMILY HISTORY:   Family Status  Relation Status Death Age  . Father Deceased 78  . Mother Deceased 105    old age  . Sister Deceased 43    ROS:  A complete 10 system review of systems was obtained and was unremarkable apart from what is mentioned above.  PHYSICAL EXAMINATION:     VITALS:   Filed Vitals:   03/26/14 0947  BP: 110/58  Pulse: 71  Height: 5\' 8"  (1.727 m)  Weight: 148 lb (67.132 kg)   Wt Readings from Last 3 Encounters:  03/26/14 148 lb (67.132 kg)  02/12/14 150 lb (68.04 kg)  01/30/14 145 lb 1.9 oz (65.826 kg)     GEN:  The patient appears stated age and is in NAD. HEENT:  Normocephalic, atraumatic.  The mucous membranes are moist. The superficial temporal arteries are without ropiness or tenderness. CV:  RRR Lungs:  CTAB Neck/HEME:  There are no carotid bruits bilaterally.  Neurological examination:  Orientation: The patient is alert and oriented x3. Fund of knowledge is appropriate.  Recent and remote memory are intact.  Attention and concentration are normal.    Able to name objects and repeat phrases.  Very good historian. Cranial nerves: There is good facial symmetry. There is facial hypomimia. The visual fields are full to confrontational testing. The speech is fluent and clear. There is hypophonic speech.  Soft palate rises symmetrically and there is no tongue deviation. Hearing is intact to conversational tone. Motor: Strength is 5/5 in the bilateral upper and lower extremities.   Shoulder shrug is equal and symmetric.  There is no pronator drift.   Movement examination: Tone: There is increased tone bilaterally, left greater than right. Abnormal movements: No tremor noted today. Coordination:  There is decremation with RAM's, especially on the L Gait and Station: The patient uses his hands to get out of the chair.  He is short stepped, but is fairly steady once he gets in the hallway.  He does turn en bloc.   ASSESSMENT/PLAN:  1.  Parkinsonism.  I suspect that this does represent idiopathic Parkinson's disease.  The patient has tremor, bradykinesia, rigidity and postural instability.  One of the atypical states cannot be ruled out given the [facility] that seems fairly early on in the diagnosis, but given hx wonder if PD wasn't going on  longer and just not dx?  -  Will cautiously continue carbidopa/levodopa 25/100 tid.  I told him today that he needs to move the dosage is closer together so that he takes it at 7 AM/11 AM to noon/4 to 5 PM.  I think he is under dose, but we decided not to add further medication given the fact that his dizziness has just resolved. He may need an additional 50/200 at night, but I did not want to ask further medication right now when his blood pressure has just become stabilized. 2.  orthostatic hypotension  --This has been very difficult to control.  I greatly appreciate cardiology's input and management of his significant orthostatic hypotension.  He is doing much better now.  He is on Florinef 0.1 mg daily and is back on low dose metoprolol 6.25 mg twice a day (as pt would become HTN with exertion) and is doing much better. 3.  Hx of migraine  -Doing fine now that Topamax has been discontinued.  4.  Constipation  -He continues to complain about this.  His daughter is continuing to give him iron, which I think is contributing.  We talked about this today.  In addition, Parkinson's disease certainly causes constipation.  I gave them a copy of the rancho recipe again, which he got last time but admits that he never used.  I encouraged him to try this. 5.  followup in the next few months, sooner should new neurologic issues arise.

## 2014-03-26 NOTE — Patient Instructions (Signed)
1. Change your dosage of Levodopa to 7 am/ 11 am/ 4 pm.  2. Constipation and Parkinson's disease:   1.Rancho recipe for constipation in Parkinsons Disease:  -1 cup of bran, 2 cups of applesauce in 1 cup of prune juice  2.  Increase fiber intake (Metamucil,vegetables)  3.  Regular, moderate exercise can be beneficial.  4.  Avoid medications causing constipation, such as medications like antacids with calcium or magnesium  5.  Laxative overuse should be avoided.  6.  Stool softeners (Colace) can help with chronic constipation.

## 2014-03-28 ENCOUNTER — Telehealth: Payer: Self-pay | Admitting: Neurology

## 2014-03-28 NOTE — Telephone Encounter (Signed)
Spoke with Rise Paganini and advised how much of Rancho recipe to use daily. They will call with any other questions.

## 2014-03-28 NOTE — Telephone Encounter (Signed)
Pt daughter Zenaida Niece called and wants to talk to someone about a mixture that will help with a BM please call [phone]

## 2014-04-11 ENCOUNTER — Telehealth: Payer: Self-pay

## 2014-04-11 NOTE — Telephone Encounter (Signed)
Pt received flu shot on 10.5.2015.  Called and spoke with pt and pt is aware.

## 2014-04-11 NOTE — Telephone Encounter (Signed)
Message copied by Colleen Can on Wed Apr 11, 2014  4:38 PM ------      Message from: Scherrie Gerlach      Created: Wed Apr 11, 2014  2:41 PM      Regarding: flu shot       Pt called to verify flu shot,  It looks like chart was not upated to reflect the 10/5 date. Is this right? ------

## 2014-04-16 ENCOUNTER — Ambulatory Visit (INDEPENDENT_AMBULATORY_CARE_PROVIDER_SITE_OTHER): Payer: Medicare Other | Admitting: Family Medicine

## 2014-04-16 ENCOUNTER — Ambulatory Visit: Payer: Medicare Other | Admitting: Family Medicine

## 2014-04-16 ENCOUNTER — Encounter: Payer: Self-pay | Admitting: Family Medicine

## 2014-04-16 VITALS — BP 120/72 | HR 72 | Temp 97.9°F | Wt 150.0 lb

## 2014-04-16 DIAGNOSIS — I951 Orthostatic hypotension: Secondary | ICD-10-CM

## 2014-04-16 DIAGNOSIS — I10 Essential (primary) hypertension: Secondary | ICD-10-CM

## 2014-04-16 DIAGNOSIS — G20 Parkinson's disease: Secondary | ICD-10-CM | POA: Insufficient documentation

## 2014-04-16 NOTE — Patient Instructions (Signed)
Great to see you again today. I am glad overall things are stable. Keep going to your appointments with your neurologist and cardiologist. I will see you back in February.   No changes from me today.

## 2014-04-16 NOTE — Assessment & Plan Note (Signed)
Well controlled today-continue metoprolol

## 2014-04-16 NOTE — Assessment & Plan Note (Signed)
Reasonable balance at this point between florinef and metoprolol. Orthostatic symptoms seem minimal but still exist. No changes to medications.

## 2014-04-16 NOTE — Progress Notes (Signed)
  Garret Reddish, MD Phone: [phone]  Subjective:   Darius Castro is a 80 y.o. year old very pleasant male patient who presents with the following:  Hypertension-well controlled Orthostatic Hypotension-reasonable control  BP Readings from Last 3 Encounters:  04/16/14 120/72  03/26/14 110/58  02/12/14 120/88  very tough balance here as exertionally gets hypertensive per previous reports but otherwise gets orthostatic. He had a slight dizzy spell this morning but short lived.  Compliant with medications-yes, metoprolol and florinef ROS-Denies any CP, HA, SOB, blurry vision.   Past Medical History- Patient Active Problem List   Diagnosis Date Noted  . Parkinson's disease 04/16/2014    Priority: High  . Orthostatic hypotension 08/23/2013    Priority: High  . Hypertension 09/05/2013    Priority: Medium  . Hyperlipidemia 11/02/2006    Priority: Medium  . Constipation due to neurogenic bowel 03/26/2014    Priority: Low  . Paralysis agitans 08/23/2013    Priority: Low  . Diarrhea 12/28/2012    Priority: Low  . 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 Osteoarthritis 01/12/2011    Priority: Low  . PROSTATE CANCER, HX OF 11/02/2006    Priority: Low   Medications- reviewed and updated Current Outpatient Prescriptions  Medication Sig Dispense Refill  . aspirin 325 MG tablet Take 325 mg by mouth daily.      . carbidopa-levodopa (SINEMET IR) 25-100 MG per tablet Take 1 tablet by mouth 3 (three) times daily.      . Coenzyme Q10 (CO Q-10) 100 MG CAPS Take 100 mg by mouth 3 (three) times daily.      . ferrous sulfate 325 (65 FE) MG tablet Take 325 mg by mouth daily with breakfast.      . fludrocortisone (FLORINEF) 0.1 MG tablet Take 1 tablet (0.1 mg total) by mouth daily. Pt states that our office called him & told him take only 1 tablet once a day (01/30/14)  90 tablet  6  . metoprolol succinate (TOPROL-XL) 12.5 mg TB24 24 hr tablet Take 0.5 tablets (12.5  mg total) by mouth 2 (two) times daily.  120 each  6  . Multiple Vitamins-Minerals (CENTRUM SILVER ULTRA WOMENS PO) Take 1 tablet by mouth daily.      . polycarbophil (FIBERCON) 625 MG tablet Take 625 mg by mouth daily.      . potassium chloride (K-DUR) 10 MEQ tablet Take 1 tablet (10 mEq total) by mouth daily.  90 tablet  3  . vitamin E 400 UNIT capsule Take 400 Units by mouth daily.       No current facility-administered medications for this visit.   Facility-Administered Medications Ordered in Other Visits  Medication Dose Route Frequency Provider Last Rate Last Dose  . povidone-iodine (BETADINE) 7.5 % scrub   Topical Once Kirstin J Shepperson, PA-C        Objective: BP 120/72  Pulse 72  Temp(Src) 97.9 F (36.6 C)  Wt 150 lb (68.04 kg) Gen: NAD, resting comfortably in chair, appears stated age CV: RRR no murmurs rubs or gallops Mucous membranes are moist. Lungs: CTAB no crackles, wheeze, rhonchi Abdomen: soft/nontender/nondistended/normal bowel sounds.  Ext: no edema Skin: warm, dry Neuro: grossly normal, moves all extremities, CN XII intact   Assessment/Plan:  Orthostatic hypotension Reasonable balance at this point between florinef and metoprolol. Orthostatic symptoms seem minimal but still exist. No changes to medications.   Hypertension Well controlled today-continue metoprolol

## 2014-05-03 ENCOUNTER — Telehealth: Payer: Self-pay | Admitting: *Deleted

## 2014-05-03 DIAGNOSIS — M5489 Other dorsalgia: Secondary | ICD-10-CM

## 2014-05-03 NOTE — Telephone Encounter (Signed)
Pt stated he fell down today.  His legs froze up and he fell down landing on his right side.  He would like Dr Yong Channel to refer him to PT.  He stated his wife gets PT through [facility] and he talked to the physical therapist, whom came out to the house today and she stated that she could do his PT also.  Spoke with Dr Yong Channel and per Dr Yong Channel ok to put referral order in.  Referral order placed, pt aware

## 2014-05-07 ENCOUNTER — Other Ambulatory Visit: Payer: Self-pay | Admitting: Neurology

## 2014-05-08 ENCOUNTER — Telehealth: Payer: Self-pay | Admitting: Internal Medicine

## 2014-05-08 NOTE — Telephone Encounter (Signed)
Carbidopa Levodopa refill requested. Per last office note- patient to remain on medication. Refill approved and sent to patient's pharmacy.   

## 2014-05-08 NOTE — Telephone Encounter (Signed)
Pt following up on PT referral.  Someone called pt yesterday to schedule pt to come in to their office.  However pt does not drive.  It looks like referral was sent to [facility]. Referral needs to be w/ [facility], hopefully the same person who does pt's wife physical therapy.  Pt is a little confused at this point.  pls advise

## 2014-05-08 NOTE — Telephone Encounter (Signed)
Deborah referral is already in computer, can you schedule home PT?  Thank you!

## 2014-05-15 NOTE — Telephone Encounter (Signed)
Pt states this referral Home health (in home) request [place].  Darius Castro is working w/ Bel[facility] and states he will do pt's home health as well. pls schedule asap/

## 2014-05-15 NOTE — Telephone Encounter (Signed)
Sent to home [facility] to set pt up for home helath

## 2014-05-28 ENCOUNTER — Telehealth: Payer: Self-pay | Admitting: Internal Medicine

## 2014-05-28 NOTE — Telephone Encounter (Signed)
[facility] needs the medicare required documentation for the home health referral. Need face to face filled out w/ date of the last appt.

## 2014-05-29 NOTE — Telephone Encounter (Signed)
Do you have this documentation Dr. Yong Channel?

## 2014-05-29 NOTE — Telephone Encounter (Signed)
Pt will need to be seen Mrs. Kathy per Dr. Ansel Bong below message, please get pt scheduled. Thank You

## 2014-05-29 NOTE — Telephone Encounter (Signed)
Daughter states pt has an appt at heart dr on thurs 12/10 in the am. Daughter tries to combine appts and was hoping to get pt in thurs.  However there are not any appts on thurs. Anyway to work pt in for the face to face? Afternoon is best. Also, daughter would like to know if pt could get his iron checked as well. Daughter has been giving him iron supplements but doesn't want to overload. Has been giving this supplement 6 months. (she will try to get this lab done at heart dr thurs am.)

## 2014-05-29 NOTE — Telephone Encounter (Signed)
Unfortunately, he would need to be seen within 30 days of face to face order which I cannot provide at this time as I last saw him on 04/16/14. This is an affordable care act requirement. My apologies-he will need to be seen.

## 2014-05-30 NOTE — Telephone Encounter (Signed)
Can we have him in at 4:30 PM? There looks to be a red 3:45 spot-clear that one and have him in at 4:30.

## 2014-05-30 NOTE — Telephone Encounter (Signed)
Daughter will have him here at that time. Thank you dr Retail banker.

## 2014-05-30 NOTE — Telephone Encounter (Signed)
Dr. Hunter please advise. 

## 2014-05-31 ENCOUNTER — Telehealth: Payer: Self-pay | Admitting: *Deleted

## 2014-05-31 ENCOUNTER — Ambulatory Visit (INDEPENDENT_AMBULATORY_CARE_PROVIDER_SITE_OTHER): Payer: Medicare Other | Admitting: Family Medicine

## 2014-05-31 ENCOUNTER — Encounter: Payer: Self-pay | Admitting: Cardiology

## 2014-05-31 ENCOUNTER — Ambulatory Visit (INDEPENDENT_AMBULATORY_CARE_PROVIDER_SITE_OTHER): Payer: Medicare Other | Admitting: Cardiology

## 2014-05-31 ENCOUNTER — Encounter: Payer: Self-pay | Admitting: Family Medicine

## 2014-05-31 VITALS — BP 138/70 | HR 79 | Temp 97.8°F | Wt 151.0 lb

## 2014-05-31 VITALS — BP 109/56 | HR 78 | Ht 68.0 in | Wt 150.0 lb

## 2014-05-31 DIAGNOSIS — G909 Disorder of the autonomic nervous system, unspecified: Secondary | ICD-10-CM

## 2014-05-31 DIAGNOSIS — I10 Essential (primary) hypertension: Secondary | ICD-10-CM

## 2014-05-31 DIAGNOSIS — I951 Orthostatic hypotension: Secondary | ICD-10-CM

## 2014-05-31 DIAGNOSIS — R296 Repeated falls: Secondary | ICD-10-CM

## 2014-05-31 DIAGNOSIS — G20 Parkinson's disease: Secondary | ICD-10-CM

## 2014-05-31 DIAGNOSIS — R42 Dizziness and giddiness: Secondary | ICD-10-CM

## 2014-05-31 LAB — CBC WITH DIFFERENTIAL/PLATELET
Basophils Absolute: 0 10*3/uL (ref 0.0–0.1)
Basophils Relative: 0.2 % (ref 0.0–3.0)
Eosinophils Absolute: 0 10*3/uL (ref 0.0–0.7)
Eosinophils Relative: 0.1 % (ref 0.0–5.0)
HCT: 38.7 % — ABNORMAL LOW (ref 39.0–52.0)
Hemoglobin: 12.6 g/dL — ABNORMAL LOW (ref 13.0–17.0)
Lymphocytes Relative: 13.2 % (ref 12.0–46.0)
Lymphs Abs: 1.1 10*3/uL (ref 0.7–4.0)
MCHC: 32.5 g/dL (ref 30.0–36.0)
MCV: 97.6 fl (ref 78.0–100.0)
Monocytes Absolute: 0.8 10*3/uL (ref 0.1–1.0)
Monocytes Relative: 9.5 % (ref 3.0–12.0)
Neutro Abs: 6.2 10*3/uL (ref 1.4–7.7)
Neutrophils Relative %: 77 % (ref 43.0–77.0)
Platelets: 238 10*3/uL (ref 150.0–400.0)
RBC: 3.97 Mil/uL — ABNORMAL LOW (ref 4.22–5.81)
RDW: 13.3 % (ref 11.5–15.5)
WBC: 8 10*3/uL (ref 4.0–10.5)

## 2014-05-31 LAB — COMPREHENSIVE METABOLIC PANEL
ALT: 14 U/L (ref 0–53)
AST: 24 U/L (ref 0–37)
Albumin: 4 g/dL (ref 3.5–5.2)
Alkaline Phosphatase: 48 U/L (ref 39–117)
BUN: 21 mg/dL (ref 6–23)
CO2: 29 mEq/L (ref 19–32)
Calcium: 9 mg/dL (ref 8.4–10.5)
Chloride: 97 mEq/L (ref 96–112)
Creatinine, Ser: 1.1 mg/dL (ref 0.4–1.5)
GFR: 65.67 mL/min (ref >60.00)
Glucose, Bld: 54 mg/dL — ABNORMAL LOW (ref 70–99)
Potassium: 4.3 mEq/L (ref 3.5–5.1)
Sodium: 133 mEq/L — ABNORMAL LOW (ref 135–145)
Total Bilirubin: 0.5 mg/dL (ref 0.2–1.2)
Total Protein: 6.2 g/dL (ref 6.0–8.3)

## 2014-05-31 MED ORDER — DROXIDOPA 200 MG PO CAPS: ORAL_CAPSULE | ORAL | Status: DC

## 2014-05-31 MED ORDER — DROXIDOPA 300 MG PO CAPS: ORAL_CAPSULE | ORAL | Status: DC

## 2014-05-31 NOTE — Telephone Encounter (Signed)
Northera Treatment Form faxed to [facility]

## 2014-05-31 NOTE — Progress Notes (Signed)
Garret Reddish, MD Phone: [phone]  Subjective:   Darius Castro is a 80 y.o. year old very pleasant male patient who presents with the following:  Parkinson's Disease-largely stable Orthostatic hypotension as results-continued issue being treated by cardiology [place]  Fall day before yesterday, broke his nose about 5 months ago. Also fell about a month ago when rehab people were at the house. THey had come out to care for wife Barnetta Chapel and he fell while they were there and then they later started rehab with him. I had placed an order for patient but had not seen within 30 days and patient requiring face to face  History of Parkinson's disease and orthostatic hypotension. Saw Dr. Meda Coffee this morning and flonef decreased and Droxidopa (for parkinsons) was started. HHPT desired by patient/family  ROS- lightheadedness especially with standing, tremor in hands, slow shuffling gait  Past Medical History- Patient Active Problem List   Diagnosis Date Noted  . Parkinson's disease 04/16/2014    Priority: High  . Orthostatic hypotension 08/23/2013    Priority: High  . Hypertension 09/05/2013    Priority: Medium  . Hyperlipidemia 11/02/2006    Priority: Medium  . Constipation due to neurogenic bowel 03/26/2014    Priority: Low  . Paralysis agitans 08/23/2013    Priority: Low  . Diarrhea 12/28/2012    Priority: Low  . 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 Osteoarthritis 01/12/2011    Priority: Low  . PROSTATE CANCER, HX OF 11/02/2006    Priority: Low   Medications- reviewed and updated Current Outpatient Prescriptions  Medication Sig Dispense Refill  . aspirin 325 MG tablet Take 325 mg by mouth daily.    . carbidopa-levodopa (SINEMET IR) 25-100 MG per tablet Take 1 tablet by mouth 3 (three) times daily.    . carbidopa-levodopa (SINEMET IR) 25-100 MG per tablet TAKE 1 TABLET 3 X DAILY 270 tablet 3  . Coenzyme Q10 (CO Q-10) 100 MG CAPS Take 100 mg by  mouth 3 (three) times daily.    . Droxidopa (NORTHERA) 200 MG CAPS Take 100 mg (1/2 tablet) TID X 3 days then increase to 200 mg (whole tablet) TID X 3 days 26 capsule 0  . Droxidopa (NORTHERA) 300 MG CAPS Begin taking after the pt is finished taking his 100 mg and 200 mg doses 30 capsule 3  . ferrous sulfate 325 (65 FE) MG tablet Take 325 mg by mouth daily with breakfast.    . fludrocortisone (FLORINEF) 0.1 MG tablet Take 1 tablet (0.1 mg total) by mouth daily. Pt states that our office called him & told him take only 1 tablet once a day (01/30/14) 90 tablet 6  . KLOR-CON M10 10 MEQ tablet   3  . Multiple Vitamins-Minerals (CENTRUM SILVER ULTRA WOMENS PO) Take 1 tablet by mouth daily.    . polycarbophil (FIBERCON) 625 MG tablet Take 625 mg by mouth daily.    . potassium chloride (K-DUR) 10 MEQ tablet Take 1 tablet (10 mEq total) by mouth daily. 90 tablet 3  . vitamin E 400 UNIT capsule Take 400 Units by mouth daily.     No current facility-administered medications for this visit.   Facility-Administered Medications Ordered in Other Visits  Medication Dose Route Frequency Provider Last Rate Last Dose  . povidone-iodine (BETADINE) 7.5 % scrub   Topical Once Kirstin J Shepperson, PA-C        Objective: BP 138/70 mmHg  Pulse 79  Temp(Src) 97.8 F (36.6 C) (  Oral)  Wt 151 lb (68.493 kg)  SpO2 98% Gen: NAD, difficulty rising, resting comfortably in chair CV: RRR no murmurs rubs or gallops Lungs: CTAB no crackles, wheeze, rhonchi Abdomen: soft/nontender/nondistended/normal bowel sounds.  Ext: no edema Skin: warm, dry, no rash Neuro: slow shuffling gait, slight pill rolling tremor   Assessment/Plan:  Parkinson's disease Due to parkinson's and orthostatic hypotension. Home health physical therapy would certainly benefit patient. He is already using a rolling walker but requires assist even with these. Patient denies transfer issues but given difficulty noted on exam today with maneuvering  out of chair, ordered OT as well. Thankful to cardiology for adjusting medications to help with orthostatic hypotension. Await neurology recs at upcoming meeting as well.   Patient closely followed by cardiology and neurology. I am happy to see patient prn or for scheduled follow up in 6 months.   Orders Placed This Encounter  Procedures  . Ambulatory referral to Home Health    Referral Priority:  Routine    Referral Type:  Home Health Care    Referral Reason:  Specialty Services Required    Requested Specialty:  [place]    Number of Visits Requested:  1

## 2014-05-31 NOTE — Patient Instructions (Signed)
Refer for home health today  Hope the new medicine from Dr. Meda Coffee helps with the dizziness

## 2014-05-31 NOTE — Assessment & Plan Note (Addendum)
Due to parkinson's and orthostatic hypotension. Home health physical therapy would certainly benefit patient. He is already using a rolling walker but requires assist even with these. Patient denies transfer issues but given difficulty noted on exam today with maneuvering out of chair, ordered OT as well. Thankful to cardiology for adjusting medications to help with orthostatic hypotension. Await neurology recs at upcoming meeting as well.

## 2014-05-31 NOTE — Progress Notes (Signed)
Patient ID: CARLE FENECH, male   DOB: [birth year], 80 y.o.   MRN: [id number] Patient ID: JAY HASKEW, male   DOB: 01-23-1924, 80 y.o.   MRN: [id number]    Patient Name: Darius Castro Date of Encounter: 05/31/2014  Primary Care Provider:  Garret Reddish, MD Primary Cardiologist:  Dorothy Spark (previously Dr Verl Blalock)   Problem List   Past Medical History  Diagnosis Date  . Hyperlipidemia     takes Lipitor daily  . Adenomatous colon polyp 3/09  . Personal history of prostate cancer   . Dysphagia     with pills  . Hypertension     takes Fosinopril daily  . Sleep apnea     sleep study done in 03/30/04 in epic;doesn't use a CPAP  . Pneumonia     hx of as a child  . Arthritis     back and hip  . Dizziness     pt states when he is hot and stands up too fast  . Back pain     hx of ruptured disc  . Dry skin   . Hemorrhoid     internal   . Hx of colonic polyps   . Nocturia   . Prostate cancer   . Impaired hearing   . 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 Diabetes mellitus     patient denies Diabetes  . COLONIC POLYPS, HX OF 11/05/2008    Qualifier: Diagnosis of  By: Julaine Hua CMA (AAMA), Estill Bamberg     Past Surgical History  Procedure Laterality Date  . Lumbar laminectomy  1998  . Knee arthroscopy  2001  . Rotator cuff repair Right 2/10  . Cataract extraction Bilateral   . Tonsillectomy      as a child  . Colonoscopy    . Radioactive seed implant  2008  . Total hip arthroplasty  08/24/2011    Procedure: TOTAL HIP ARTHROPLASTY;  Surgeon: Lorn Junes, MD;  Location: [facility];  Service: Orthopedics;  Laterality: Right;  DR [place]  . Zenker's diverticulectomy  09/02/2011    Procedure: ZENKER'S DIVERTICULECTOMY;  Surgeon: Melida Quitter, MD;  Location: [facility] OR;  Service: ENT;  Laterality: N/A;    Allergies  No Known Allergies  HPI  A 80 year old male with h/o hyperlipidemia, hypertension who is coming with the main concern of dizziness on  a daily basis especially when he stands up too fast. Going off the BP med 2 wks ago is not what is making him dizzy, he states this has been going on 2 yrs, they think it could be his migraine med which on the bottle states it could cause dizziness topiramate.  Pt stays dizzy all the time. Even when he goes to exercise, he has to sit down cause he's dizzy.   He reports no falls, no palpitations, chest pain or shortness of breath. No syncope.  He is currently taking fludrocortisone 0.1 mg daily.  Orthostatics today: Lying: 181/83, HR 73 Sitting: 152/79, HR 75 Standing: 104/55, HR 73   Stop ACE inhibitor at the last visit and started beta blocker. However patient states that his symptoms got even worse. He brings a letter from his physical therapist that your not able to perform any therapy as he feels dizzy every time he stands up.  01/30/2014 - at the last visit we stopped the beta blockers and And increased fludrocortisone to 0.2 mg daily. His BP got to 160 at rest and  190-200 during exertion. We decreased fludrocortisone to 0.1 mg daily 2 weeks ago and he started to feel significantly better however his blood pressure still goes up when he tries to exert himself. We will add metoprolol 6.25 mg twice a day. The family is concerned about anemia. Last hemoglobin was 12.5  05/31/2014 - the patient is coming after 4 months. Since the last visit he experienced another on falls with no significant trauma. Some days he feels dizzy most of the time but overall his symptoms have remained pretty much the same. He denies any chest pain, shortness of breath no lower extremity edema. He has been drinking 8 glasses of water a day and is compliant with his medication regimen.  Home Medications  Prior to Admission medications   Medication Sig Start Date End Date Taking? Authorizing Provider  aspirin 325 MG tablet Take 325 mg by mouth daily.   Yes Historical Provider, MD  carbidopa-levodopa (SINEMET IR)  25-100 MG per tablet Take 1 tablet by mouth 3 (three) times daily. 08/23/13  Yes Historical Provider, MD  fludrocortisone (FLORINEF) 0.1 MG tablet Take 1 tablet (0.1 mg total) by mouth daily. 10/03/13  Yes Rebecca S Tat, DO  fosinopril (MONOPRIL) 20 MG tablet  08/23/13  Yes Historical Provider, MD  polycarbophil (FIBERCON) 625 MG tablet Take 625 mg by mouth daily.   Yes Historical Provider, MD  topiramate (TOPAMAX) 25 MG tablet Take 25 mg by mouth daily.    Yes Historical Provider, MD    Family History  Family History  Problem Relation Age of Onset  . Heart disease Father   . Heart attack Father 54  . Colon polyps Daughter   . Colon cancer Neg Hx   . Anesthesia problems Neg Hx   . Hypotension Neg Hx   . Malignant hyperthermia Neg Hx   . Pseudochol deficiency Neg Hx   . Hypertension Mother   . Cancer Sister     Social History  History   Social History  . Marital Status: Married    Spouse Name: N/A    Number of Children: 3  . Years of Education: N/A   Occupational History  . retired    Social History Main Topics  . Smoking status: Former Smoker    Quit date: 06/22/1961  . Smokeless tobacco: Never Used  . Alcohol Use: Yes     Comment: red wine 3-4 times/wk  . Drug Use: No  . Sexual Activity: Not Currently   Other Topics Concern  . Not on file   Social History Narrative     Review of Systems, as per HPI, otherwise negative General:  No chills, fever, night sweats or weight changes.  Cardiovascular:  No chest pain, dyspnea on exertion, edema, orthopnea, palpitations, paroxysmal nocturnal dyspnea. Dermatological: No rash, lesions/masses Respiratory: No cough, dyspnea Urologic: No hematuria, dysuria Abdominal:   No nausea, vomiting, diarrhea, bright red blood per rectum, melena, or hematemesis Neurologic:  No visual changes, wkns, changes in mental status. All other systems reviewed and are otherwise negative except as noted above.  Physical Exam  Blood pressure  109/56, pulse 78, height 5\' 8"  (1.727 m), weight 150 lb (68.04 kg), SpO2 97 %.  General: Pleasant, NAD Psych: Normal affect. Neuro: Alert and oriented X 3. Moves all extremities spontaneously. HEENT: Normal  Neck: Supple without bruits or JVD. Lungs:  Resp regular and unlabored, CTA. Heart: RRR no s3, s4, or 2/6 systolic murmur at the LLSB. Abdomen: Soft, non-tender, non-distended, BS + x 4.  Extremities: No clubbing, cyanosis or edema. DP/PT/Radials 2+ and equal bilaterally.  Labs:  No results for input(s): CKTOTAL, CKMB, TROPONINI in the last 72 hours. Lab Results  Component Value Date   WBC 7.6 01/30/2014   HGB 12.8* 01/30/2014   HCT 38.5* 01/30/2014   MCV 96.2 01/30/2014   PLT 239.0 01/30/2014    Lab Results  Component Value Date   DDIMER 0.73* 05/30/2012   Invalid input(s): POCBNP    Component Value Date/Time   NA 138 01/30/2014 0906   K 4.4 01/30/2014 0906   CL 99 01/30/2014 0906   CO2 27 01/30/2014 0906   GLUCOSE 104* 01/30/2014 0906   BUN 20 01/30/2014 0906   CREATININE 1.1 01/30/2014 0906   CALCIUM 9.2 01/30/2014 0906   PROT 6.6 01/30/2014 0906   ALBUMIN 4.1 01/30/2014 0906   AST 21 01/30/2014 0906   ALT 5 01/30/2014 0906   ALKPHOS 41 01/30/2014 0906   BILITOT 0.8 01/30/2014 0906   GFRNONAA 72* 11/15/2013 1230   GFRAA 84* 11/15/2013 1230   Lab Results  Component Value Date   CHOL 155 05/30/2012   HDL 68.90 05/30/2012   LDLCALC 75 05/30/2012   TRIG 57.0 05/30/2012    Accessory Clinical Findings  Echocardiogram - 2012 - Left ventricle: The cavity size was normal. There was mild focal basal hypertrophy of the septum. Systolic function was normal. The estimated ejection fraction was in the range of 55% to 65%. Wall motion was normal; there were no regional wall motion abnormalities. Doppler parameters are consistent with abnormal left ventricular relaxation (grade 1 diastolic dysfunction). - Aortic valve: Trivial regurgitation. - Mitral valve:  Mild regurgitation. - Tricuspid valve: Moderate regurgitation. - Pulmonary arteries: Systolic pressure was moderately increased. PA peak pressure: 56mm Hg (S).  02/12/2014 - Left ventricle: The cavity size was normal. There was mild focal basal hypertrophy of the septum. Systolic function was normal. The estimated ejection fraction was in the range of 55% to 60%. Wall motion was normal; there were no regional wall motion abnormalities. Doppler parameters are consistent with abnormal left ventricular relaxation (grade 1 diastolic dysfunction). - Pulmonary arteries: Systolic pressure was mildly increased. PA peak pressure: 35 mm Hg (S).  Impressions: - Normal LV function; grade 1 diastolic dysfunction; mildly elevated pulmonary pressures.  ECG - SR, PACs, nonspecific T wave abnormalities   Assessment & Plan  80 year old male   1. Orthostatic hypotension - primary autonomic failure sec to Parkinson disease - d/c-ed ACEI, we will discontinue metoprolol  He didn't tolerate  fludrocortisone to 0.2 mg daily due to BP 190-200 during exertion. We decreased fludrocortisone to 0.1 mg daily. We will also start him on Droxidopa (Northera) 100 mg po TID x 3 days, then 200 mg poYID x 3 days, then 300 mg po TID.   2. Chronic CHF with preserved LVEF - stable, asymptomatic  3. Pulmonary HTN - repeat echocardiogram shoed  4. History of anemia - the family is concerned about low hemoglobin contributing to his symptoms, they were giving him iron pills. His last hemoglobin was 12.5 in May 2015. We will check CBC and CMP today.  Will call patient in one week to see how her his symptoms. Followup in 2 months.   Dorothy Spark, MD, [facility] 05/31/2014, 10:48 AM

## 2014-05-31 NOTE — Patient Instructions (Addendum)
Your physician has recommended you make the following change in your medication: stop taking Metoprolol and start taking Northera (take 100 mg three times daily for 3 days then increase to 200 mg three times daily for 3 days then increase to 300 mg there after  Your physician recommends that you return for lab work in: today (CM[facility] and [address])  Your physician wants you to follow-up in: 3 months. You will receive a reminder letter in the mail two months in advance. If you don't receive a letter, please call our office to schedule the follow-up appointment.

## 2014-05-31 NOTE — Progress Notes (Signed)
Pre visit review using our clinic review tool, if applicable. No additional management support is needed unless otherwise documented below in the visit note. 

## 2014-06-11 ENCOUNTER — Telehealth: Payer: Self-pay | Admitting: Cardiology

## 2014-06-11 ENCOUNTER — Encounter: Payer: Self-pay | Admitting: *Deleted

## 2014-06-11 NOTE — Telephone Encounter (Signed)
New Message      Patients daughter call would like a call back about medication that they were going to put her dad on. Please call back .

## 2014-06-11 NOTE — Telephone Encounter (Signed)
LMTCB in regards to medication question

## 2014-06-11 NOTE — Telephone Encounter (Signed)
New Message  Pt requested to speak with [facility] about an authorization. Please call back and discuss.

## 2014-06-11 NOTE — Telephone Encounter (Signed)
Will forward this to [facility] for further review and follow-up.

## 2014-06-19 NOTE — Telephone Encounter (Signed)
Drug Rep for Era contacted the office and left a message with the operator in regards to pt denying free shipment of Era, that was recently prescribed by Dr Meda Coffee.  No contact number left by operator to return call back to Era Drug Rep.  Contacted Daughter who is pts POA and on DPR and informed her that the Era Drug Rep tried contacting her father to inform him that he will be sending a free shipment of Era to pts Residence, and pt denied this stating he is unsure of this new med and why he should be taking this.  Pts Daughter is aware of this newly prescribed med at last OV where Dr Meda Coffee ordered this, and states she will contact her father to explain the need for this medication, and attempt to contact the Rep back.  Daughter voiced that she will contact our office back if further assistance needed.

## 2014-06-19 NOTE — Telephone Encounter (Signed)
Pt c/o medication issue: 1. Name of Medication: New Era 2. How are you currently taking this medication (dosage and times per day)? Not yet 3. Are you having a reaction (difficulty breathing--STAT)?  no 4. What is your medication issue? New era rep Joe called. States he reached out to the patient to set up a free shipment. Apparently the pt is not sure that he was advised to take the medication.. Or he wasn't familiar. Please call the patient and advise the patient of why the medication was prescribed.

## 2014-06-28 ENCOUNTER — Telehealth: Payer: Self-pay | Admitting: Cardiology

## 2014-06-28 NOTE — Telephone Encounter (Signed)
LMTCB to inform the daughter that the prior authorization is still in process for this medication.  Left detailed message that the insurance company is requiring further testing like orthostatic VS repeated in increments to be done and faxed to [facility], to complete the process.  Left message for the daughter to call back tomorrow and ask for Dr [employer] scheduler, so she can schedule the pt to come in for a OV at Dr Francesca Oman very next available appt, and complete the prior auth checklist for Northera. Pt has an OV in March, but this should be moved up, in order to get the medication approved quicker.

## 2014-06-28 NOTE — Telephone Encounter (Signed)
F/u    Pt daughter need you to call her concerning Northera. She has questions.

## 2014-06-29 ENCOUNTER — Ambulatory Visit: Payer: Medicare Other | Admitting: Cardiology

## 2014-06-29 ENCOUNTER — Encounter: Payer: Self-pay | Admitting: Neurology

## 2014-06-29 ENCOUNTER — Ambulatory Visit (INDEPENDENT_AMBULATORY_CARE_PROVIDER_SITE_OTHER): Payer: Medicare Other | Admitting: Neurology

## 2014-06-29 VITALS — BP 140/72 | HR 88 | Ht 68.0 in | Wt 154.0 lb

## 2014-06-29 DIAGNOSIS — I951 Orthostatic hypotension: Secondary | ICD-10-CM

## 2014-06-29 DIAGNOSIS — G20 Parkinson's disease: Secondary | ICD-10-CM

## 2014-06-29 NOTE — Telephone Encounter (Signed)
I filled the form and we will refax for pre-approval, no appointment needed yet

## 2014-06-29 NOTE — Patient Instructions (Signed)
3 month follow-up

## 2014-06-29 NOTE — Progress Notes (Signed)
Darius Castro was seen today in the movement disorders clinic for neurologic consultation at the request of Garret Reddish, MD.  The consultation is for the evaluation of tremor and to r/o ET vs PD.  This patient is accompanied in the office by his spouse who supplements the history.  Pt reports a one year hx of tremor in the RUE.  He notes it most when he is just walking or at rest.  He will sometimes notice it when using it.  He is R hand dominant.  His wife also noted   Specific Symptoms:  Tremor: yes (R hand only)  -Affected by caffeine:  no  Affected by alcohol: unknown  Affected by stress:  no  Affected by fatigue:  no  Spills soup if on spoon:  no  Spills glass of liquid if full:  no  Affects ADL's (tying shoes, brushing teeth, etc):  no Voice: hypophonic and decreased ability to understand pt Sleep: sleeps well  Vivid Dreams:  no  Acting out dreams:  no Wet Pillows: no Postural symptoms:  yes (shuffling x 1 year and getting worse)  Falls?  yes (1-2 total and last was few months ago - doesn't recall exact circumstance) Bradykinesia symptoms: shuffling gait, slow movements, difficulty getting out of a chair and difficulty regaining balance Loss of smell:  yes Loss of taste:  no Urinary Incontinence:  yes (rarely and just leakage) Difficulty Swallowing:  no Handwriting, micrographia: yes Trouble with ADL's:  no  Trouble buttoning clothing: no Depression:  no Memory changes:  no (still does driving/finances) Hallucinations:  no  visual distortions: no N/V:  no Lightheaded:  yes  Syncope: no Diplopia:  no Dyskinesia:  no  Neuroimaging has previously been performed.  MRI brain was performed in 2008 demonstrating atrophy and mild small vessel disease.  It is available for my review today.  Pt saw Dr. Brett Fairy in 2008 for diplopia and was dx with migraine and was started on topamax.  Was free of visual changes for a year and then restarted and increased med to 25 mg - 2  po bid.  Had SE with the medication (tired, dizzy) and went back on topamax 25 mg bid 2 weeks ago after bad migraine (b/l frontal, cannot describe quality, no vision change - took advil with quick relief)  10/03/13: The patient is following up in Parkinson's disease, which was just diagnosed on 08/23/2013.  This patient is accompanied in the office by his spouse who supplements the history.  At that time I started him on levodopa. Pt is not sure the carbidopa/levodopa 25/100 is helping.  I did review notes available to me since last visit.  After our last visit, he immediately went to his primary care physician because of orthostatic hypotension.  His Monopril was decreased.  He went to the emergency room on 09/14/2013.  He had fallen after becoming dizzy.  There was no associated loss of consciousness.  He did end up fracturing his nasal bone.  In the end, his blood pressure medicine was discontinued.  Despite this, he continued to have dizziness that has been disabling. Pt feels rather insistent that this is not from the levodopa, as it has been going on for 2 years.  Occasional diplopia, lasting 3-4 minutes.  Is always with the dizziness.    11/20/13 update:  The patient is following up today, accompanied by his daughter and son-in-law who supplements the history.  The patient has seen cardiology and his primary  care physician today.  Records were reviewed since last visit.  Last visit, I started him on Florinef.  I also requested a cardiology consult.  They initially discontinued his Monopril because of a vasodilatory properties of ACE inhibitors and changed it to metoprolol 25 mg twice a day.  The patient went to the emergency room on May 27 because of a fall after near syncopal episode after getting out of a car.  The patient felt dizzy but did not actually lose consciousness.  He followed up with cardiology today and they discontinued his metoprolol and increased the Florinef to 0.2 mg.  I also received  notes from the patient's physical therapist stating that the patient will have bouts of dizziness that lasts up to 10 seconds and he will have to sit in order to not fall.  There have been no hallucinations.  No headaches.  12/20/13 update:  The patient is seen back in followup for his Parkinson's disease.  He is accompanied by his daughter who supplements the history.  He is currently on carbidopa/levodopa 25/100, one tablet 3 times per day.  Last visit, his biggest difficulty was orthostatic hypotension.  His Florinef was increased and metoprolol was decreased.  He sent me a series of blood pressure are observations, and most of those were quite good.  Today, the patient states that he is no longer dizzy, but his daughter disagrees.  She does give me a log that the patient has been keeping.  Often times, he has dizziness in the morning until approximately 10 AM or 11 AM and then he does well.  For the last 3 days he has not been dizzy at all.  She states that the physical therapists have told her that they have put therapy on hold because he could not participate because of dizziness.  The patient reports feeling much better than prior to last visit.  No headaches.  No hallucinations.  No near syncope.  03/26/14 update:  The patient was seen in follow up today, accompanied by his daughter who supplements the history.  In addition, had the opportunity to review records made available to me, specifically his cardiology records.  He remains on carbidopa/levodopa 25/100 3 times per day.  He states he needed to 7 AM/2 PM/6 PM.  I discontinued his Topamax for migraine last visit.  He has had no further headaches.  He has been struggling with the management of orthostatic hypotension, complicated by hypertension when he exerts himself.  Cardiology had back low dose metoprolol, 6.25 mg twice per day and slightly dropped his Florinef.  He is also drinking much more water and the BP is much better.  He brought a BP og with  him today and BP's are much better and the dizziness is basically resolved.  He does state that he became faint this AM and ended up falling because he couldn't sit fast enough.  He didn't get hurt.  This was the first time it happened in a long time.  06/29/14 update:  The patient has history of Parkinson's disease, which has been complicated by significant orthostatic hypotension.  He is accompanied by his son who supplements the history and we talked with his daughter on the phone during the visit and his request.   I reviewed records since last visit.  Patient is on carbidopa/levodopa 25/100, 3 times per day.  He probably could use a higher dose, but I have been leery to add higher dosages because of his orthostatic hypotension, which  has been kindly managed by cardiology.  His daughter, on the phone, says that tremor is worse.  Tremor does not bother the patient at all.    Since last visit they have decreased his Florinef and started him on droxidopa.  He actually has not gotten this yet.  He states that just occasionally he has felt lightheaded but according to the patient this is better.  His biggest c/o is poor energy and lack of stamina.  He did fall in 05/03/2014 and was referred to physical therapy by his primary care physician.  That was the only fall that he had.  He has completed PT and it helped somewhat.  No hallucinations.    PREVIOUS MEDICATIONS: none to date  ALLERGIES:  No Known Allergies  CURRENT MEDICATIONS:  Current Outpatient Prescriptions on File Prior to Visit  Medication Sig Dispense Refill  . aspirin 325 MG tablet Take 325 mg by mouth daily.    . carbidopa-levodopa (SINEMET IR) 25-100 MG per tablet Take 1 tablet by mouth 3 (three) times daily.    . carbidopa-levodopa (SINEMET IR) 25-100 MG per tablet TAKE 1 TABLET 3 X DAILY 270 tablet 3  . Coenzyme Q10 (CO Q-10) 100 MG CAPS Take 100 mg by mouth 3 (three) times daily.    . Droxidopa (NORTHERA) 200 MG CAPS Take 100 mg (1/2  tablet) TID X 3 days then increase to 200 mg (whole tablet) TID X 3 days 26 capsule 0  . Droxidopa (NORTHERA) 300 MG CAPS Begin taking after the pt is finished taking his 100 mg and 200 mg doses 30 capsule 3  . ferrous sulfate 325 (65 FE) MG tablet Take 325 mg by mouth daily with breakfast.    . fludrocortisone (FLORINEF) 0.1 MG tablet Take 1 tablet (0.1 mg total) by mouth daily. Pt states that our office called him & told him take only 1 tablet once a day (01/30/14) 90 tablet 6  . KLOR-CON M10 10 MEQ tablet   3  . Multiple Vitamins-Minerals (CENTRUM SILVER ULTRA WOMENS PO) Take 1 tablet by mouth daily.    . polycarbophil (FIBERCON) 625 MG tablet Take 625 mg by mouth daily.    . potassium chloride (K-DUR) 10 MEQ tablet Take 1 tablet (10 mEq total) by mouth daily. 90 tablet 3  . vitamin E 400 UNIT capsule Take 400 Units by mouth daily.     Current Facility-Administered Medications on File Prior to Visit  Medication Dose Route Frequency Provider Last Rate Last Dose  . povidone-iodine (BETADINE) 7.5 % scrub   Topical Once Kirstin Earl Lagos, PA-C        PAST MEDICAL HISTORY:   Past Medical History  Diagnosis Date  . Hyperlipidemia     takes Lipitor daily  . Adenomatous colon polyp 3/09  . Personal history of prostate cancer   . Dysphagia     with pills  . Hypertension     takes Fosinopril daily  . Sleep apnea     sleep study done in 03/30/04 in epic;doesn't use a CPAP  . Pneumonia     hx of as a child  . Arthritis     back and hip  . Dizziness     pt states when he is hot and stands up too fast  . Back pain     hx of ruptured disc  . Dry skin   . Hemorrhoid     internal   . Hx of colonic polyps   . Nocturia   .  Prostate cancer   . Impaired hearing   . 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 Diabetes mellitus     patient denies Diabetes  . COLONIC POLYPS, HX OF 11/05/2008    Qualifier: Diagnosis of  By: Julaine Hua CMA (AAMA), Estill Bamberg      PAST SURGICAL HISTORY:     Past Surgical History  Procedure Laterality Date  . Lumbar laminectomy  1998  . Knee arthroscopy  2001  . Rotator cuff repair Right 2/10  . Cataract extraction Bilateral   . Tonsillectomy      as a child  . Colonoscopy    . Radioactive seed implant  2008  . Total hip arthroplasty  08/24/2011    Procedure: TOTAL HIP ARTHROPLASTY;  Surgeon: Lorn Junes, MD;  Location: [place];  Service: Orthopedics;  Laterality: Right;  DR Baird  . Zenker's diverticulectomy  09/02/2011    Procedure: ZENKER'S DIVERTICULECTOMY;  Surgeon: Melida Quitter, MD;  Location: [place];  Service: ENT;  Laterality: N/A;    SOCIAL HISTORY:   History   Social History  . Marital Status: Married    Spouse Name: N/A    Number of Children: 3  . Years of Education: N/A   Occupational History  . retired    Social History Main Topics  . Smoking status: Former Smoker    Quit date: 06/22/1961  . Smokeless tobacco: Never Used  . Alcohol Use: Yes     Comment: red wine 3-4 times/wk  . Drug Use: No  . Sexual Activity: Not Currently   Other Topics Concern  . Not on file   Social History Narrative    FAMILY HISTORY:   Family Status  Relation Status Death Age  . Father Deceased 79  . Mother Deceased 62    old age  . Sister Deceased 71    ROS:  A complete 10 system review of systems was obtained and was unremarkable apart from what is mentioned above.  PHYSICAL EXAMINATION:    VITALS:   There were no vitals filed for this visit. Wt Readings from Last 3 Encounters:  05/31/14 151 lb (68.493 kg)  05/31/14 150 lb (68.04 kg)  04/16/14 150 lb (68.04 kg)     GEN:  The patient appears stated age and is in NAD. HEENT:  Normocephalic, atraumatic.  The mucous membranes are moist. The superficial temporal arteries are without ropiness or tenderness. CV:  RRR Lungs:  CTAB Neck/HEME:  There are no carotid bruits bilaterally.  Neurological examination:  Orientation: The patient is  alert and oriented x3. Fund of knowledge is appropriate.   Cranial nerves: There is good facial symmetry. There is facial hypomimia. The visual fields are full to confrontational testing. The speech is fluent and clear. There is hypophonic speech.  Soft palate rises symmetrically and there is no tongue deviation. Hearing is intact to conversational tone. Motor: Strength is 5/5 in the bilateral upper and lower extremities.   Shoulder shrug is equal and symmetric.  There is no pronator drift.   Movement examination: Tone: There is increased tone bilaterally Abnormal movements: No tremor noted today. Coordination:  There is decremation with RAM's, especially on the L Gait and Station: The patient uses his hands to get out of the chair.  He walks well with his walker  ASSESSMENT/PLAN:  1.  Parkinsonism.  I suspect that this does represent idiopathic Parkinson's disease.  The patient has tremor, bradykinesia, rigidity and postural instability.  One of the atypical  states cannot be ruled out given the [facility] that seems fairly early on in the diagnosis, but given hx wonder if PD wasn't going on longer and just not dx?  -  I had a very long talk with the patient and his son today.  Although his Parkinson's is slightly undertreated, I am leery to increase the dose of levodopa.  We talked about potentially changing to Rytary, but this would increase his pill burden.  This would also greatly increase the cost to him.  In addition, his biggest complaint is lack of energy and lack of stamina, and I don't think that the change to Rytary is going to help this.  In the end, we decided to see how he first does with the change to droxidopa and then if that is not better, then we can try to change to Rytary in the future.  The change to droxidopa is also likely going to involve a significant increase in cost. 2.  orthostatic hypotension  --This has been very difficult to control.  I greatly appreciate cardiology's input  and management of his significant orthostatic hypotension.  He is awaiting addition of droxidopa, in addition to Florinef 0.1 mg daily. 3.  Hx of migraine  -Doing fine now that Topamax has been discontinued. 4.  Constipation  -daughter is continuing to give him iron, which I think is contributing.   In addition, Parkinson's disease certainly causes constipation.  Given rancho recipe last visit. 5.  followup in the next few months, sooner should new neurologic issues arise.

## 2014-07-02 ENCOUNTER — Telehealth: Payer: Self-pay | Admitting: Cardiology

## 2014-07-02 NOTE — Telephone Encounter (Signed)
New Message  Pt called to have medication sorted out. Would not go into details. She reports nurse Karlene Einstein would know. Please call back to disucss

## 2014-07-02 NOTE — Telephone Encounter (Signed)
lmtcb/lr

## 2014-07-04 NOTE — Telephone Encounter (Signed)
Spoke with the pts daughter and EC (on [place]) and informed her that we faxed additional paper work requested by [facility], for the medication Northera today.  Informed the daughter that currently the prior authorization for this medication is still in process, and when the process is complete, we will follow-up with her and the pt thereafter.  Daughter verbalized understanding and agrees with this plan.

## 2014-07-17 ENCOUNTER — Telehealth: Payer: Self-pay | Admitting: Cardiology

## 2014-07-17 NOTE — Telephone Encounter (Signed)
Contacted the daughter in regards to the message below, and Rise Paganini the pts daughter states that [facility] [employer] sent the pt a letter in the mail denying coverage for the medication Northera.  Daughter stated that [facility] [employer] stated that no additional information was faxed to them, in order to make a decision to approve or deny this medication.  Informed the daughter that our office has documented that we have sent a multitude of clinical information to have this drug approved for the pt.  Informed the daughter that medical records was able to duplicate these documents sent.  Advised the daughter to fax the letter received by [facility] [employer] to our office to further review and follow-up with.  Daughter did send this letter and this was received. Letter stating that Dr Meda Coffee would need to call [facility] [employer] at [phone] to appeal and provide clinical information.  Informed the daughter that Dr Meda Coffee is out of the office at this time, but I will endorse this letter to her for further review and recommendation and follow-up with the daughter thereafter with a plan.  Daughter verbalized understanding, agrees with this plan, and gracious for all the assistance provided.

## 2014-07-17 NOTE — Telephone Encounter (Signed)
New message    Daughter calling has important letter that came in the mail - regarding medication that she need to discuss.

## 2014-07-18 NOTE — Telephone Encounter (Signed)
I am really sorry for the troubles we caused him, this is a new company and we expected more financial support.   We can try to use midodrine 5 mg po TID PRN just to be just as needed when he feels dizzy.

## 2014-07-19 ENCOUNTER — Telehealth: Payer: Self-pay | Admitting: Neurology

## 2014-07-19 MED ORDER — MIDODRINE HCL 5 MG PO TABS: 5.0000 mg | ORAL_TABLET | Freq: Three times a day (TID) | ORAL | Status: DC | PRN

## 2014-07-19 NOTE — Telephone Encounter (Signed)
Informed the daughter that per Dr Meda Coffee she apologizes for the troubles that [name] has caused, and not being able to financially support the pt to receive this drug.  Informed the daughter Rise Paganini that Dr Meda Coffee would like to try the pt on midodrine 5 mg po TID PRN when he feels dizzy.  Confirmed the pharmacy of choice with the daughter.  Daughter verbalized understanding and agrees with this plan.

## 2014-07-19 NOTE — Addendum Note (Signed)
Addended by: Nuala Alpha on: 07/19/2014 11:56 AM   Modules accepted: Orders, Medications

## 2014-07-19 NOTE — Telephone Encounter (Signed)
Pt daughter Rise Paganini called and states that pt needs a refill on sinement called in to the [place] phone number is [phone]

## 2014-07-19 NOTE — Telephone Encounter (Signed)
Called and made them aware that 90 day supply with 3 refills was sent in on 05/08/2014. They will contact the pharmacy for refill and call with any problems.

## 2014-08-14 ENCOUNTER — Ambulatory Visit: Payer: Medicare Other | Admitting: Family Medicine

## 2014-08-14 ENCOUNTER — Telehealth: Payer: Self-pay | Admitting: Cardiology

## 2014-08-14 NOTE — Telephone Encounter (Signed)
New message      Talk to Darius Castro----medication that was cancelled was delivered.  Need to know what to do

## 2014-08-14 NOTE — Telephone Encounter (Signed)
Informed Darius Castro the pts daughter that per Dr Meda Coffee she should send this medication Lauree Chandler) back to the company and the pt should continue on his current regimen as ordered by Dr Meda Coffee.  Darius Castro verbalized understanding and agrees with this plan.

## 2014-08-30 ENCOUNTER — Ambulatory Visit: Payer: Medicare Other | Admitting: Cardiology

## 2014-09-12 ENCOUNTER — Telehealth: Payer: Self-pay | Admitting: Cardiology

## 2014-09-12 NOTE — Telephone Encounter (Signed)
Dr Meda Coffee aware of this decline of Northera.  Pt on a different regimen.

## 2014-09-12 NOTE — Telephone Encounter (Signed)
New message    Patient decline medication - northerl     They wanted to let Dr. Meda Coffee know they are closing out the case.

## 2014-09-24 ENCOUNTER — Ambulatory Visit (INDEPENDENT_AMBULATORY_CARE_PROVIDER_SITE_OTHER): Payer: Medicare Other | Admitting: Cardiology

## 2014-09-24 ENCOUNTER — Encounter: Payer: Self-pay | Admitting: Cardiology

## 2014-09-24 VITALS — BP 122/60 | HR 78 | Ht 68.0 in | Wt 159.0 lb

## 2014-09-24 DIAGNOSIS — G909 Disorder of the autonomic nervous system, unspecified: Secondary | ICD-10-CM

## 2014-09-24 DIAGNOSIS — I951 Orthostatic hypotension: Secondary | ICD-10-CM

## 2014-09-24 DIAGNOSIS — I10 Essential (primary) hypertension: Secondary | ICD-10-CM | POA: Diagnosis not present

## 2014-09-24 DIAGNOSIS — I5032 Chronic diastolic (congestive) heart failure: Secondary | ICD-10-CM | POA: Diagnosis not present

## 2014-09-24 MED ORDER — MIDODRINE HCL 10 MG PO TABS: 10.0000 mg | ORAL_TABLET | Freq: Three times a day (TID) | ORAL | Status: AC

## 2014-09-24 NOTE — Patient Instructions (Signed)
Your physician has recommended you make the following change in your medication:    INCREASE YOUR MIDODRINE TO 10 MG THREE TIMES DAILY     Your physician wants you to follow-up in: [place] will receive a reminder letter in the mail two months in advance. If you don't receive a letter, please call our office to schedule the follow-up appointment.

## 2014-09-24 NOTE — Progress Notes (Signed)
Patient ID: TAMARICK KOVALCIK, male   DOB: 1923/09/28, 81 y.o.   MRN: [id number]    Patient Name: Darius Castro Date of Encounter: 05/31/2014  Primary Care Provider: Garret Reddish, MD Primary Cardiologist: Dorothy Spark (previously Dr Verl Blalock)   Chief complain: Dizziness  Problem List  Past Medical History  Diagnosis Date  . Hyperlipidemia     takes Lipitor daily  . Adenomatous colon polyp 3/09  . Personal history of prostate cancer   . Dysphagia     with pills  . Hypertension     takes Fosinopril daily  . Sleep apnea     sleep study done in 03/30/04 in epic;doesn't use a CPAP  . Pneumonia     hx of as a child  . Arthritis     back and hip  . Dizziness     pt states when he is hot and stands up too fast  . Back pain     hx of ruptured disc  . Dry skin   . Hemorrhoid     internal   . Hx of colonic polyps   . Nocturia   . Prostate cancer   . Impaired hearing   . 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 Diabetes mellitus     patient denies Diabetes  . COLONIC POLYPS, HX OF 11/05/2008    Qualifier: Diagnosis of By: Julaine Hua CMA (AAMA), Estill Bamberg    Past Surgical History  Procedure Laterality Date  . Lumbar laminectomy  1998  . Knee arthroscopy  2001  . Rotator cuff repair Right 2/10  . Cataract extraction Bilateral   . Tonsillectomy      as a child  . Colonoscopy    . Radioactive seed implant  2008  . Total hip arthroplasty  08/24/2011    Procedure: TOTAL HIP ARTHROPLASTY; Surgeon: Lorn Junes, MD; Location: [facility]; Service: Orthopedics; Laterality: Right; [place]  . Zenker's diverticulectomy  09/02/2011    Procedure: ZENKER'S DIVERTICULECTOMY; Surgeon: Melida Quitter, MD; Location: [facility] OR; Service: ENT; Laterality: N/A;    Allergies  No Known Allergies  HPI  A 81 year old  male with h/o hyperlipidemia, hypertension who is coming with the main concern of dizziness on a daily basis especially when he stands up too fast. Going off the BP med 2 wks ago is not what is making him dizzy, he states this has been going on 2 yrs, they think it could be his migraine med which on the bottle states it could cause dizziness topiramate.  Pt stays dizzy all the time. Even when he goes to exercise, he has to sit down cause he's dizzy.  He reports no falls, no palpitations, chest pain or shortness of breath. No syncope.  He is currently taking fludrocortisone 0.1 mg daily.  Orthostatics today: Lying: 181/83, HR 73 Sitting: 152/79, HR 75 Standing: 104/55, HR 73   Stop ACE inhibitor at the last visit and started beta blocker. However patient states that his symptoms got even worse. He brings a letter from his physical therapist that your not able to perform any therapy as he feels dizzy every time he stands up.  01/30/2014 - at the last visit we stopped the beta blockers and And increased fludrocortisone to 0.2 mg daily. His BP got to 160 at rest and 190-200 during exertion. We decreased fludrocortisone to 0.1 mg daily 2 weeks ago and he started to feel significantly better however his blood pressure still goes up  when he tries to exert himself. We will add metoprolol 6.25 mg twice a day. The family is concerned about anemia. Last hemoglobin was 12.5  05/31/2014 - the patient is coming after 4 months. Since the last visit he experienced another on falls with no significant trauma. Some days he feels dizzy most of the time but overall his symptoms have remained pretty much the same. He denies any chest pain, shortness of breath no lower extremity edema. He has been drinking 8 glasses of water a day and is compliant with his medication regimen.  09/24/14 - the patient is feeling well, 1 recent fall, he doesn't remember circumstances, he continues to feel dizzy, however its improved on  midodrine 5 mg po TID. Northera wasn't approved by [facility]. He denies DOE, CP< lower extremity edema.    Home Medications  Prior to Admission medications   Medication Sig Start Date End Date Taking? Authorizing Provider  aspirin 325 MG tablet Take 325 mg by mouth daily.   Yes Historical Provider, MD  carbidopa-levodopa (SINEMET IR) 25-100 MG per tablet Take 1 tablet by mouth 3 (three) times daily. 08/23/13  Yes Historical Provider, MD  fludrocortisone (FLORINEF) 0.1 MG tablet Take 1 tablet (0.1 mg total) by mouth daily. 10/03/13  Yes Rebecca S Tat, DO  fosinopril (MONOPRIL) 20 MG tablet  08/23/13  Yes Historical Provider, MD  polycarbophil (FIBERCON) 625 MG tablet Take 625 mg by mouth daily.   Yes Historical Provider, MD  topiramate (TOPAMAX) 25 MG tablet Take 25 mg by mouth daily.    Yes Historical Provider, MD    Family History  Family History  Problem Relation Age of Onset  . Heart disease Father   . Heart attack Father 56  . Colon polyps Daughter   . Colon cancer Neg Hx   . Anesthesia problems Neg Hx   . Hypotension Neg Hx   . Malignant hyperthermia Neg Hx   . Pseudochol deficiency Neg Hx   . Hypertension Mother   . Cancer Sister     Social History  History   Social History  . Marital Status: Married    Spouse Name: N/A    Number of Children: 3  . Years of Education: N/A   Occupational History  . retired    Social History Main Topics  . Smoking status: Former Smoker    Quit date: 06/22/1961  . Smokeless tobacco: Never Used  . Alcohol Use: Yes     Comment: red wine 3-4 times/wk  . Drug Use: No  . Sexual Activity: Not Currently   Other Topics Concern  . Not on file   Social History Narrative     Review of Systems, as per HPI, otherwise negative General: No chills, fever, night sweats or weight  changes.  Cardiovascular: No chest pain, dyspnea on exertion, edema, orthopnea, palpitations, paroxysmal nocturnal dyspnea. Dermatological: No rash, lesions/masses Respiratory: No cough, dyspnea Urologic: No hematuria, dysuria Abdominal: No nausea, vomiting, diarrhea, bright red blood per rectum, melena, or hematemesis Neurologic: No visual changes, wkns, changes in mental status. All other systems reviewed and are otherwise negative except as noted above.  Physical Exam  Blood pressure 109/56, pulse 78, height 5\' 8"  (1.727 m), weight 150 lb (68.04 kg), SpO2 97 %.  General: Pleasant, NAD Psych: Normal affect. Neuro: Alert and oriented X 3. Moves all extremities spontaneously. HEENT: Normal Neck: Supple without bruits or JVD. Lungs: Resp regular and unlabored, CTA. Heart: RRR no s3, s4, or 2/6 systolic murmur at  the LLSB. Abdomen: Soft, non-tender, non-distended, BS + x 4.  Extremities: No clubbing, cyanosis or edema. DP/PT/Radials 2+ and equal bilaterally.  Labs:   Recent Labs (last 2 labs)     No results for input(s): CKTOTAL, CKMB, TROPONINI in the last 72 hours.   Lab Results  Component Value Date   WBC 7.6 01/30/2014   HGB 12.8* 01/30/2014   HCT 38.5* 01/30/2014   MCV 96.2 01/30/2014   PLT 239.0 01/30/2014    Lab Results  Component Value Date   DDIMER 0.73* 05/30/2012    Last Labs     Invalid input(s): POCBNP    Labs (Brief)       Component Value Date/Time   NA 138 01/30/2014 0906   K 4.4 01/30/2014 0906   CL 99 01/30/2014 0906   CO2 27 01/30/2014 0906   GLUCOSE 104* 01/30/2014 0906   BUN 20 01/30/2014 0906   CREATININE 1.1 01/30/2014 0906   CALCIUM 9.2 01/30/2014 0906   PROT 6.6 01/30/2014 0906   ALBUMIN 4.1 01/30/2014 0906   AST 21 01/30/2014 0906   ALT 5 01/30/2014 0906   ALKPHOS 41 01/30/2014 0906   BILITOT 0.8 01/30/2014 0906   GFRNONAA 72*  11/15/2013 1230   GFRAA 84* 11/15/2013 1230     Lab Results  Component Value Date   CHOL 155 05/30/2012   HDL 68.90 05/30/2012   LDLCALC 75 05/30/2012   TRIG 57.0 05/30/2012    Accessory Clinical Findings  Echocardiogram - 2012 - Left ventricle: The cavity size was normal. There was mild focal basal hypertrophy of the septum. Systolic function was normal. The estimated ejection fraction was in the range of 55% to 65%. Wall motion was normal; there were no regional wall motion abnormalities. Doppler parameters are consistent with abnormal left ventricular relaxation (grade 1 diastolic dysfunction). - Aortic valve: Trivial regurgitation. - Mitral valve: Mild regurgitation. - Tricuspid valve: Moderate regurgitation. - Pulmonary arteries: Systolic pressure was moderately increased. PA peak pressure: 94mm Hg (S).  02/12/2014 - Left ventricle: The cavity size was normal. There was mild focal basal hypertrophy of the septum. Systolic function was normal. The estimated ejection fraction was in the range of 55% to 60%. Wall motion was normal; there were no regional wall motion abnormalities. Doppler parameters are consistent with abnormal left ventricular relaxation (grade 1 diastolic dysfunction). - Pulmonary arteries: Systolic pressure was mildly increased. PA peak pressure: 35 mm Hg (S).  Impressions: - Normal LV function; grade 1 diastolic dysfunction; mildly elevated pulmonary pressures.  ECG - SR, PACs, nonspecific T wave abnormalities   Assessment & Plan  81 year old male   1. Orthostatic hypotension - primary autonomic failure sec to Parkinson disease - d/c-ed ACEI, we discontinued metoprolol He didn't tolerate fludrocortisone to 0.2 mg daily due to BP 190-200 during exertion. Droxidopa (Northera) wasn't approved by [facility]. 100 mg po TID x 3 days, then 200 mg poYID x 3 days, then 300 mg po TID.  Improved on midodrine  5 mg po TID, we will increase to 10 mg po TID, if he developes HTN, advised to take 5 mg po TID and take an extra 5 mg PRN for dizziness.  2. Chronic CHF with preserved LVEF - stable, asymptomatic  3. Pulmonary HTN - repeat echocardiogram shows only borderline RVSP.  4. History of anemia - the family is concerned about low hemoglobin contributing to his symptoms, they were giving him iron pills. His last hemoglobin was 12.6 in 12/ 2015.  Followup in 6 months.  Dorothy Spark 09/24/2014

## 2014-10-12 ENCOUNTER — Other Ambulatory Visit: Payer: Self-pay | Admitting: Cardiology

## 2014-10-12 NOTE — Telephone Encounter (Signed)
Medication not on recent office visit. Should he still be taking this? Please advise. Thanks, MI

## 2014-10-15 NOTE — Telephone Encounter (Signed)
Will route to Dr Meda Coffee for further review and recommendation of refill of Potassium chloride

## 2014-12-14 ENCOUNTER — Encounter: Payer: Self-pay | Admitting: Gastroenterology

## 2014-12-21 ENCOUNTER — Telehealth: Payer: Self-pay | Admitting: Family Medicine

## 2014-12-21 NOTE — Telephone Encounter (Signed)
Rise Paganini (daughter) sent me a jury summons that pt received.  Advised her I would get to you and you could assure her he would not have to go. Pt was worried even to the point he thought he might need to get a attorney. Advised her not at all!! You will find summons coming to you via interoffice. Thanks!

## 2014-12-25 NOTE — Telephone Encounter (Signed)
The patient can submit the summons. I checked off all he needs to check and then he just needs to send a copy of his license or birth certificate. I left this with you Bevelyn Ngo

## 2014-12-25 NOTE — Telephone Encounter (Signed)
See below

## 2014-12-26 NOTE — Telephone Encounter (Signed)
The form is upfront for pt Darius Castro, was unable to reach pt.

## 2014-12-27 NOTE — Telephone Encounter (Signed)
Pt aware and paperwork sent to [facility].

## 2015-02-01 ENCOUNTER — Telehealth: Payer: Self-pay | Admitting: Neurology

## 2015-02-01 ENCOUNTER — Encounter: Payer: Self-pay | Admitting: Neurology

## 2015-02-01 ENCOUNTER — Ambulatory Visit
Admission: RE | Admit: 2015-02-01 | Discharge: 2015-02-01 | Disposition: A | Discharge status: Home / Self Care | Payer: Medicare Other | Source: Ambulatory Visit | Attending: Neurology | Admitting: Neurology

## 2015-02-01 ENCOUNTER — Telehealth: Payer: Self-pay | Admitting: Family Medicine

## 2015-02-01 ENCOUNTER — Ambulatory Visit (INDEPENDENT_AMBULATORY_CARE_PROVIDER_SITE_OTHER): Payer: Medicare Other | Admitting: Neurology

## 2015-02-01 VITALS — BP 144/74 | HR 78

## 2015-02-01 DIAGNOSIS — G20 Parkinson's disease: Secondary | ICD-10-CM

## 2015-02-01 DIAGNOSIS — I951 Orthostatic hypotension: Secondary | ICD-10-CM | POA: Diagnosis not present

## 2015-02-01 DIAGNOSIS — R55 Syncope and collapse: Secondary | ICD-10-CM | POA: Diagnosis not present

## 2015-02-01 DIAGNOSIS — W19XXXA Unspecified fall, initial encounter: Secondary | ICD-10-CM

## 2015-02-01 DIAGNOSIS — G20A1 Parkinson's disease without dyskinesia, without mention of fluctuations: Secondary | ICD-10-CM

## 2015-02-01 MED ORDER — CARBIDOPA-LEVODOPA 25-100 MG PO TABS: 1.5000 | ORAL_TABLET | Freq: Three times a day (TID) | ORAL | Status: AC

## 2015-02-01 NOTE — Patient Instructions (Addendum)
1. Increase Carbidopa Levodopa 25/100 1.5 tablets 3 times daily.  2. Please go to [facility] on the 1st floor of our building for STAT CT head today.  3. We have referred you to [facility] for physical therapy. They will contact you directly to set up care. They can be reached at [phone].

## 2015-02-01 NOTE — Telephone Encounter (Signed)
Referral to [facility] for Parkinson's Program PT/OT faxed to [phone] with confirmation received. They will contact patient.

## 2015-02-01 NOTE — Progress Notes (Signed)
Darius Castro was seen today in the movement disorders clinic for neurologic consultation at the request of Garret Reddish, MD.  The consultation is for the evaluation of tremor and to r/o ET vs PD.  This patient is accompanied in the office by his spouse who supplements the history.  Pt reports a one year hx of tremor in the RUE.  He notes it most when he is just walking or at rest.  He will sometimes notice it when using it.  He is R hand dominant.  His wife also noted   Specific Symptoms:  Tremor: yes (R hand only)  -Affected by caffeine:  no  Affected by alcohol: unknown  Affected by stress:  no  Affected by fatigue:  no  Spills soup if on spoon:  no  Spills glass of liquid if full:  no  Affects ADL's (tying shoes, brushing teeth, etc):  no Voice: hypophonic and decreased ability to understand pt Sleep: sleeps well  Vivid Dreams:  no  Acting out dreams:  no Wet Pillows: no Postural symptoms:  yes (shuffling x 1 year and getting worse)  Falls?  yes (1-2 total and last was few months ago - doesn't recall exact circumstance) Bradykinesia symptoms: shuffling gait, slow movements, difficulty getting out of a chair and difficulty regaining balance Loss of smell:  yes Loss of taste:  no Urinary Incontinence:  yes (rarely and just leakage) Difficulty Swallowing:  no Handwriting, micrographia: yes Trouble with ADL's:  no  Trouble buttoning clothing: no Depression:  no Memory changes:  no (still does driving/finances) Hallucinations:  no  visual distortions: no N/V:  no Lightheaded:  yes  Syncope: no Diplopia:  no Dyskinesia:  no  Neuroimaging has previously been performed.  MRI brain was performed in 2008 demonstrating atrophy and mild small vessel disease.  It is available for my review today.  Pt saw Dr. Brett Fairy in 2008 for diplopia and was dx with migraine and was started on topamax.  Was free of visual changes for a year and then restarted and increased med to 25 mg - 2  po bid.  Had SE with the medication (tired, dizzy) and went back on topamax 25 mg bid 2 weeks ago after bad migraine (b/l frontal, cannot describe quality, no vision change - took advil with quick relief)  10/03/13: The patient is following up in Parkinson's disease, which was just diagnosed on 08/23/2013.  This patient is accompanied in the office by his spouse who supplements the history.  At that time I started him on levodopa. Pt is not sure the carbidopa/levodopa 25/100 is helping.  I did review notes available to me since last visit.  After our last visit, he immediately went to his primary care physician because of orthostatic hypotension.  His Monopril was decreased.  He went to the emergency room on 09/14/2013.  He had fallen after becoming dizzy.  There was no associated loss of consciousness.  He did end up fracturing his nasal bone.  In the end, his blood pressure medicine was discontinued.  Despite this, he continued to have dizziness that has been disabling. Pt feels rather insistent that this is not from the levodopa, as it has been going on for 2 years.  Occasional diplopia, lasting 3-4 minutes.  Is always with the dizziness.    11/20/13 update:  The patient is following up today, accompanied by his daughter and son-in-law who supplements the history.  The patient has seen cardiology and his primary  care physician today.  Records were reviewed since last visit.  Last visit, I started him on Florinef.  I also requested a cardiology consult.  They initially discontinued his Monopril because of a vasodilatory properties of ACE inhibitors and changed it to metoprolol 25 mg twice a day.  The patient went to the emergency room on May 27 because of a fall after near syncopal episode after getting out of a car.  The patient felt dizzy but did not actually lose consciousness.  He followed up with cardiology today and they discontinued his metoprolol and increased the Florinef to 0.2 mg.  I also received  notes from the patient's physical therapist stating that the patient will have bouts of dizziness that lasts up to 10 seconds and he will have to sit in order to not fall.  There have been no hallucinations.  No headaches.  12/20/13 update:  The patient is seen back in followup for his Parkinson's disease.  He is accompanied by his daughter who supplements the history.  He is currently on carbidopa/levodopa 25/100, one tablet 3 times per day.  Last visit, his biggest difficulty was orthostatic hypotension.  His Florinef was increased and metoprolol was decreased.  He sent me a series of blood pressure are observations, and most of those were quite good.  Today, the patient states that he is no longer dizzy, but his daughter disagrees.  She does give me a log that the patient has been keeping.  Often times, he has dizziness in the morning until approximately 10 AM or 11 AM and then he does well.  For the last 3 days he has not been dizzy at all.  She states that the physical therapists have told her that they have put therapy on hold because he could not participate because of dizziness.  The patient reports feeling much better than prior to last visit.  No headaches.  No hallucinations.  No near syncope.  03/26/14 update:  The patient was seen in follow up today, accompanied by his daughter who supplements the history.  In addition, had the opportunity to review records made available to me, specifically his cardiology records.  He remains on carbidopa/levodopa 25/100 3 times per day.  He states he needed to 7 AM/2 PM/6 PM.  I discontinued his Topamax for migraine last visit.  He has had no further headaches.  He has been struggling with the management of orthostatic hypotension, complicated by hypertension when he exerts himself.  Cardiology had back low dose metoprolol, 6.25 mg twice per day and slightly dropped his Florinef.  He is also drinking much more water and the BP is much better.  He brought a BP og with  him today and BP's are much better and the dizziness is basically resolved.  He does state that he became faint this AM and ended up falling because he couldn't sit fast enough.  He didn't get hurt.  This was the first time it happened in a long time.  06/29/14 update:  The patient has history of Parkinson's disease, which has been complicated by significant orthostatic hypotension.  He is accompanied by his son who supplements the history and we talked with his daughter on the phone during the visit and his request.   I reviewed records since last visit.  Patient is on carbidopa/levodopa 25/100, 3 times per day.  He probably could use a higher dose, but I have been leery to add higher dosages because of his orthostatic hypotension, which  has been kindly managed by cardiology.  His daughter, on the phone, says that tremor is worse.  Tremor does not bother the patient at all.    Since last visit they have decreased his Florinef and started him on droxidopa.  He actually has not gotten this yet.  He states that just occasionally he has felt lightheaded but according to the patient this is better.  His biggest c/o is poor energy and lack of stamina.  He did fall in 05/03/2014 and was referred to physical therapy by his primary care physician.  That was the only fall that he had.  He has completed PT and it helped somewhat.  No hallucinations.    02/01/15 update:  The patient is following up today.  This patient is accompanied in the office by his child who supplements the history.  I have not seen him in approximately 7 months.  He is only on carbidopa/levodopa, 25/100 3 times per day.  I have been cautious with this medication because of significant orthostatic hypotension.  Last visit, he was waiting on approval of droxidopa, but unfortunately his insurance company denied this.  He ended up starting midodrine; he cannot state if this has helped.  He says that he has checked his BP and sometimes it is in the 180's  (very acceptable) and sometimes in the 80's.  He is having more falls.  He had 3 falls in the past week.  He states that earlier this week his legs just "locked up" on him and he fell.  He has trouble getting up.  He has an area of ecchymosis under the chin and states that this is where he fell.  Thinks he lost consciousness for few seconds but doesn't know.  Gets dizzy before a fall.  Cannot state if he is passing out and that is causing the falls.  "I just don't feel good."  Stamina is poor.  Trouble getting into bed at home.  PREVIOUS MEDICATIONS: none to date  ALLERGIES:  No Known Allergies  CURRENT MEDICATIONS:  Current Outpatient Prescriptions on File Prior to Visit  Medication Sig Dispense Refill  . aspirin 325 MG tablet Take 325 mg by mouth daily.    . carbidopa-levodopa (SINEMET IR) 25-100 MG per tablet TAKE 1 TABLET 3 X DAILY 270 tablet 3  . Coenzyme Q10 (CO Q-10) 100 MG CAPS Take 100 mg by mouth 3 (three) times daily.    . ferrous sulfate 325 (65 FE) MG tablet Take 325 mg by mouth daily with breakfast.    . fludrocortisone (FLORINEF) 0.1 MG tablet Take 1 tablet (0.1 mg total) by mouth daily. Pt states that our office called him & told him take only 1 tablet once a day (01/30/14) 90 tablet 6  . KLOR-CON M10 10 MEQ tablet TAKE 1 TABLET (10 MEQ TOTAL) BY MOUTH DAILY. 90 tablet 3  . midodrine (PROAMATINE) 10 MG tablet Take 1 tablet (10 mg total) by mouth 3 (three) times daily. 270 tablet 6  . Multiple Vitamins-Minerals (CENTRUM SILVER ULTRA WOMENS PO) Take 1 tablet by mouth daily.    . polycarbophil (FIBERCON) 625 MG tablet Take 625 mg by mouth daily.    . vitamin E 400 UNIT capsule Take 400 Units by mouth daily.     Current Facility-Administered Medications on File Prior to Visit  Medication Dose Route Frequency Provider Last Rate Last Dose  . povidone-iodine (BETADINE) 7.5 % scrub   Topical Once [employer], PA-C  PAST MEDICAL HISTORY:   Past Medical History  Diagnosis  Date  . Hyperlipidemia     takes Lipitor daily  . Adenomatous colon polyp 3/09  . Personal history of prostate cancer   . Dysphagia     with pills  . Hypertension     takes Fosinopril daily  . Sleep apnea     sleep study done in 03/30/04 in epic;doesn't use a CPAP  . Pneumonia     hx of as a child  . Arthritis     back and hip  . Dizziness     pt states when he is hot and stands up too fast  . Back pain     hx of ruptured disc  . Dry skin   . Hemorrhoid     internal   . Hx of colonic polyps   . Nocturia   . Prostate cancer   . Impaired hearing   . 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 Diabetes mellitus     patient denies Diabetes  . COLONIC POLYPS, HX OF 11/05/2008    Qualifier: Diagnosis of  By: Julaine Hua CMA (AAMA), Estill Bamberg      PAST SURGICAL HISTORY:   Past Surgical History  Procedure Laterality Date  . Lumbar laminectomy  1998  . Knee arthroscopy  2001  . Rotator cuff repair Right 2/10  . Cataract extraction Bilateral   . Tonsillectomy      as a child  . Colonoscopy    . Radioactive seed implant  2008  . Total hip arthroplasty  08/24/2011    Procedure: TOTAL HIP ARTHROPLASTY;  Surgeon: Lorn Junes, MD;  Location: [place] [facility];  Service: Orthopedics;  Laterality: Right;  DR Lutz  . Zenker's diverticulectomy  09/02/2011    Procedure: ZENKER'S DIVERTICULECTOMY;  Surgeon: Melida Quitter, MD;  Location[facility];  Service: ENT;  Laterality: N/A;    SOCIAL HISTORY:   Social History   Social History  . Marital Status: Married    Spouse Name: N/A  . Number of Children: 3  . Years of Education: N/A   Occupational History  . retired    Social History Main Topics  . Smoking status: Former Smoker    Quit date: 06/22/1961  . Smokeless tobacco: Never Used  . Alcohol Use: Yes     Comment: red wine 3-4 times/wk  . Drug Use: No  . Sexual Activity: Not Currently   Other Topics Concern  . Not on file   Social History Narrative     FAMILY HISTORY:   Family Status  Relation Status Death Age  . Father Deceased 85  . Mother Deceased 76    old age  . Sister Deceased 29    ROS:  A complete 10 system review of systems was obtained and was unremarkable apart from what is mentioned above.  PHYSICAL EXAMINATION:    VITALS:   Filed Vitals:   02/01/15 1438  BP: 144/74  Pulse: 78   Wt Readings from Last 3 Encounters:  09/24/14 159 lb (72.122 kg)  06/29/14 154 lb (69.854 kg)  05/31/14 151 lb (68.493 kg)     GEN:  The patient appears stated age and is in NAD. HEENT:  Normocephalic.  There is an area of ecchymosis under his mandible on the L that is in the healing stages.  The mucous membranes are moist. The superficial temporal arteries are without ropiness or tenderness. CV:  RRR Lungs:  CTAB Neck/HEME:  There  are no carotid bruits bilaterally.  Neurological examination:  Orientation: The patient is alert and oriented x3. Fund of knowledge is appropriate.   Cranial nerves: There is good facial symmetry. There is facial hypomimia. The visual fields are full to confrontational testing. The speech is fluent and clear. There is hypophonic speech.  Soft palate rises symmetrically and there is no tongue deviation. Hearing is intact to conversational tone. Motor: Strength is 5/5 in the bilateral upper and lower extremities.   Shoulder shrug is equal and symmetric.  There is no pronator drift.   Movement examination: Tone: There is mild increased tone (with gegenhalten as well) Abnormal movements: No tremor noted today. Coordination:  There is slowness to his rapid alternating movements, but not necessarily decremation. Gait and Station: The patient uses his hands to get out of the chair.  He walks well with his walker but has trouble getting back into the chair and has DOE and poor endurance.  ASSESSMENT/PLAN:  1.  Parkinsonism.  I suspect that this does represent idiopathic Parkinson's disease.  The patient  has tremor, bradykinesia, rigidity and postural instability.  One of the atypical states cannot be ruled out given the [facility] that seems fairly early on in the diagnosis, but given hx wonder if PD wasn't going on longer and just not dx?  -  I had a very long talk with the patient and his son today.  We will cautiously try to increase the levodopa to carbidopa/levodopa 25/100, 1-1/2 tablets 3 times per day.  Will need to watch BP and let me know if this exacerbates OH  -Based on his rather poor history today, it is difficult to figure out whether or not he is actually falling, or whether he is having episodes of orthostasis, which is causing falls.  He actually reports that he may be having syncopal episodes, which could be a result of significant orthostasis.  Because of a recent fall/orthostatic episode resulting in injury to the head, I am going to do a CT of the brain.  -I am going to send physical and occupational therapy to his house.  I think that much of his issue is because of poor endurance/poor stamina. 2.  orthostatic hypotension  --insurance apparently denied droxidopa and is now on midodrine and florinef.  Being managed by cardiology. 3.  Hx of migraine  -Doing fine now that Topamax has been discontinued. 4.  Constipation  -On iron (not sure he needs), which I think is contributing.   In addition, Parkinson's disease certainly causes constipation.  Given rancho recipe previously 5.  followup in the next 75months, sooner should new neurologic issues arise.  Much greater than 50% of this visit was spent in counseling with the patient and the family.  Total face to face time:  25 min

## 2015-02-01 NOTE — Addendum Note (Signed)
Addended byAnnamaria Helling on: 02/01/2015 03:13 PM   Modules accepted: Orders

## 2015-02-01 NOTE — Telephone Encounter (Signed)
-----   Message from [place], DO sent at 02/01/2015  4:42 PM EDT ----- Let pt/son know that CT looked good that was done today.  Nothing new from that fall.

## 2015-02-01 NOTE — Telephone Encounter (Signed)
Patient & patient's daughter/Beverly were notified of results.

## 2015-02-01 NOTE — Telephone Encounter (Signed)
[facility] called

## 2015-02-06 ENCOUNTER — Telehealth: Payer: Self-pay | Admitting: Neurology

## 2015-02-06 NOTE — Telephone Encounter (Signed)
Pt's daughter Rise Paganini called and needed the name and number of physical therapist Dr Carles Collet referred/Dawn CB# [phone]

## 2015-02-06 NOTE — Telephone Encounter (Signed)
Called and spoke with [facility] and they have heard from [place].

## 2015-03-05 ENCOUNTER — Other Ambulatory Visit: Payer: Self-pay | Admitting: Cardiology

## 2015-03-20 ENCOUNTER — Telehealth: Payer: Self-pay | Admitting: Family Medicine

## 2015-03-20 NOTE — Telephone Encounter (Addendum)
Daughter states pt has developed a toe fungus on both big toes. Would like to know if you could call in a med to help with this? [facility]    Also would like to know if you recommend a whole baby Asprin or one half daily for pt.

## 2015-03-20 NOTE — Telephone Encounter (Signed)
Please see message and advise 

## 2015-03-20 NOTE — Telephone Encounter (Signed)
I usually confirm fungus before treating by getting a sample of nail.  This would require an office visit.  The reason is that the medicine takes about 3 months to work, requires basline liver function tests and then repeat after a month or so on the medicine.   Whole baby aspirin every other day should be fine

## 2015-03-20 NOTE — Telephone Encounter (Signed)
Please schedule pt an OV for toenail fungus.

## 2015-03-22 NOTE — Telephone Encounter (Signed)
Daughter is traveling and will cb when she gets her calendar in front of her.

## 2015-04-01 ENCOUNTER — Telehealth: Payer: Self-pay | Admitting: Family Medicine

## 2015-04-01 NOTE — Telephone Encounter (Signed)
Daughter states that his physical therapist suggested if dr hunter could call in a "mild" anti-depressant. Physical therapist states they are trying to work with him but he has a lack of motivation. Thinking he may be a little depressed and would like to try something.  [facility]

## 2015-04-01 NOTE — Telephone Encounter (Signed)
Please schedule OV for pt per Dr. Yong Channel

## 2015-04-01 NOTE — Telephone Encounter (Signed)
Will this require an OV?

## 2015-04-01 NOTE — Telephone Encounter (Signed)
Yes would prefer to talk to patient. Although medicines generally safe- some questions I need to ask and questions we need to go over

## 2015-04-08 ENCOUNTER — Ambulatory Visit (INDEPENDENT_AMBULATORY_CARE_PROVIDER_SITE_OTHER): Payer: Medicare Other | Admitting: Cardiology

## 2015-04-08 ENCOUNTER — Encounter: Payer: Self-pay | Admitting: Cardiology

## 2015-04-08 ENCOUNTER — Encounter: Payer: Self-pay | Admitting: Family

## 2015-04-08 ENCOUNTER — Ambulatory Visit: Payer: Medicare Other | Admitting: Family Medicine

## 2015-04-08 ENCOUNTER — Ambulatory Visit (INDEPENDENT_AMBULATORY_CARE_PROVIDER_SITE_OTHER): Payer: Medicare Other | Admitting: Family

## 2015-04-08 VITALS — BP 150/80 | HR 74 | Ht 68.0 in

## 2015-04-08 VITALS — BP 138/84 | HR 75 | Temp 98.3°F | Ht 68.0 in

## 2015-04-08 DIAGNOSIS — F411 Generalized anxiety disorder: Secondary | ICD-10-CM

## 2015-04-08 DIAGNOSIS — F32A Depression, unspecified: Secondary | ICD-10-CM

## 2015-04-08 DIAGNOSIS — I10 Essential (primary) hypertension: Secondary | ICD-10-CM | POA: Diagnosis not present

## 2015-04-08 DIAGNOSIS — E785 Hyperlipidemia, unspecified: Secondary | ICD-10-CM

## 2015-04-08 DIAGNOSIS — Z9181 History of falling: Secondary | ICD-10-CM

## 2015-04-08 DIAGNOSIS — G20 Parkinson's disease: Secondary | ICD-10-CM | POA: Diagnosis not present

## 2015-04-08 DIAGNOSIS — G909 Disorder of the autonomic nervous system, unspecified: Secondary | ICD-10-CM | POA: Diagnosis not present

## 2015-04-08 DIAGNOSIS — F329 Major depressive disorder, single episode, unspecified: Secondary | ICD-10-CM

## 2015-04-08 HISTORY — DX: History of falling: Z91.81

## 2015-04-08 LAB — BASIC METABOLIC PANEL
BUN: 20 mg/dL (ref 6–23)
CALCIUM: 9.2 mg/dL (ref 8.4–10.5)
CO2: 30 meq/L (ref 19–32)
CREATININE: 1.08 mg/dL (ref 0.40–1.50)
Chloride: 100 mEq/L (ref 96–112)
GFR: 68.35 mL/min (ref >60.00)
Glucose, Bld: 104 mg/dL — ABNORMAL HIGH (ref 70–99)
Potassium: 4 mEq/L (ref 3.5–5.1)
Sodium: 140 mEq/L (ref 135–145)

## 2015-04-08 LAB — CBC WITH DIFFERENTIAL/PLATELET
Basophils Absolute: 0 10*3/uL (ref 0.0–0.1)
Basophils Relative: 0.2 % (ref 0.0–3.0)
EOS ABS: 0.1 10*3/uL (ref 0.0–0.7)
Eosinophils Relative: 0.7 % (ref 0.0–5.0)
HCT: 38 % — ABNORMAL LOW (ref 39.0–52.0)
Hemoglobin: 12.7 g/dL — ABNORMAL LOW (ref 13.0–17.0)
Lymphocytes Relative: 14.2 % (ref 12.0–46.0)
Lymphs Abs: 1.1 10*3/uL (ref 0.7–4.0)
MCHC: 33.3 g/dL (ref 30.0–36.0)
MCV: 94.2 fl (ref 78.0–100.0)
MONO ABS: 0.7 10*3/uL (ref 0.1–1.0)
Monocytes Relative: 8.5 % (ref 3.0–12.0)
NEUTROS ABS: 5.9 10*3/uL (ref 1.4–7.7)
Neutrophils Relative %: 76.4 % (ref 43.0–77.0)
PLATELETS: 281 10*3/uL (ref 150.0–400.0)
RBC: 4.04 Mil/uL — ABNORMAL LOW (ref 4.22–5.81)
RDW: 13.4 % (ref 11.5–15.5)
WBC: 7.7 10*3/uL (ref 4.0–10.5)

## 2015-04-08 LAB — TSH: TSH: 2.88 u[IU]/mL (ref 0.35–4.50)

## 2015-04-08 MED ORDER — PAROXETINE HCL 20 MG PO TABS: 20.0000 mg | ORAL_TABLET | Freq: Every day | ORAL | Status: DC

## 2015-04-08 NOTE — Progress Notes (Signed)
Pre visit review using our clinic review tool, if applicable. No additional management support is needed unless otherwise documented below in the visit note. Weight not measured today due to patient being in a wheelchair.

## 2015-04-08 NOTE — Patient Instructions (Signed)
Your physician recommends that you continue on your current medications as directed. Please refer to the Current Medication list given to you today.     Your physician wants you to follow-up in: 6 MONTHS WITH DR NELSON You will receive a reminder letter in the mail two months in advance. If you don't receive a letter, please call our office to schedule the follow-up appointment.  

## 2015-04-08 NOTE — Patient Instructions (Signed)
Major Depressive Disorder Major depressive disorder is a mental illness. It also may be called clinical depression or unipolar depression. Major depressive disorder usually causes feelings of sadness, hopelessness, or helplessness. Some people with this disorder do not feel particularly sad but lose interest in doing things they used to enjoy (anhedonia). Major depressive disorder also can cause physical symptoms. It can interfere with work, school, relationships, and other normal everyday activities. The disorder varies in severity but is longer lasting and more serious than the sadness we all feel from time to time in our lives. Major depressive disorder often is triggered by stressful life events or major life changes. Examples of these triggers include divorce, loss of your job or home, a move, and the death of a family member or close friend. Sometimes this disorder occurs for no obvious reason at all. People who have family members with major depressive disorder or bipolar disorder are at higher risk for developing this disorder, with or without life stressors. Major depressive disorder can occur at any age. It may occur just once in your life (single episode major depressive disorder). It may occur multiple times (recurrent major depressive disorder). SYMPTOMS People with major depressive disorder have either anhedonia or depressed mood on nearly a daily basis for at least 2 weeks or longer. Symptoms of depressed mood include:  Feelings of sadness (blue or down in the dumps) or emptiness.  Feelings of hopelessness or helplessness.  Tearfulness or episodes of crying (may be observed by others).  Irritability (children and adolescents). In addition to depressed mood or anhedonia or both, people with this disorder have at least four of the following symptoms:  Difficulty sleeping or sleeping too much.   Significant change (increase or decrease) in appetite or weight.   Lack of energy or  motivation.  Feelings of guilt and worthlessness.   Difficulty concentrating, remembering, or making decisions.  Unusually slow movement (psychomotor retardation) or restlessness (as observed by others).   Recurrent wishes for death, recurrent thoughts of self-harm (suicide), or a suicide attempt. People with major depressive disorder commonly have persistent negative thoughts about themselves, other people, and the world. People with severe major depressive disorder may experiencedistorted beliefs or perceptions about the world (psychotic delusions). They also may see or hear things that are not real (psychotic hallucinations). DIAGNOSIS Major depressive disorder is diagnosed through an assessment by your health care provider. Your health care provider will ask aboutaspects of your daily life, such as mood,sleep, and appetite, to see if you have the diagnostic symptoms of major depressive disorder. Your health care provider may ask about your medical history and use of alcohol or drugs, including prescription medicines. Your health care provider also may do a physical exam and blood work. This is because certain medical conditions and the use of certain substances can cause major depressive disorder-like symptoms (secondary depression). Your health care provider also may refer you to a mental health specialist for further evaluation and treatment. TREATMENT It is important to recognize the symptoms of major depressive disorder and seek treatment. The following treatments can be prescribed for this disorder:   Medicine. Antidepressant medicines usually are prescribed. Antidepressant medicines are thought to correct chemical imbalances in the brain that are commonly associated with major depressive disorder. Other types of medicine may be added if the symptoms do not respond to antidepressant medicines alone or if psychotic delusions or hallucinations occur.  Talk therapy. Talk therapy can be  helpful in treating major depressive disorder by providing   support, education, and guidance. Certain types of talk therapy also can help with negative thinking (cognitive behavioral therapy) and with relationship issues that trigger this disorder (interpersonal therapy). A mental health specialist can help determine which treatment is best for you. Most people with major depressive disorder do well with a combination of medicine and talk therapy. Treatments involving electrical stimulation of the brain can be used in situations with extremely severe symptoms or when medicine and talk therapy do not work over time. These treatments include electroconvulsive therapy, transcranial magnetic stimulation, and vagal nerve stimulation.   This information is not intended to replace advice given to you by your health care provider. Make sure you discuss any questions you have with your health care provider.   Document Released: 10/03/2012 Document Revised: 06/29/2014 Document Reviewed: 10/03/2012 Elsevier Interactive Patient Education 2016 [employer]  

## 2015-04-08 NOTE — Progress Notes (Signed)
Subjective:    Patient ID: Darius Castro, male    DOB: [birth year], 81 y.o.   MRN: [id number]  HPI 81 year old white male, nonsmoker with a history of hypertension, Parkinson disease, and hyperlipidemia is in today after physical therapy suggested that he take a mild antidepressant to help with depression and increased his motivation.   Review of Systems  Constitutional: Negative.   Respiratory: Negative.   Cardiovascular: Negative.   Gastrointestinal: Negative.   Endocrine: Negative.   Musculoskeletal: Negative.   Skin: Negative.   Allergic/Immunologic: Negative.   Neurological: Negative.   Psychiatric/Behavioral: Positive for sleep disturbance. The patient is nervous/anxious.    Past Medical History  Diagnosis Date  . Hyperlipidemia     takes Lipitor daily  . Adenomatous colon polyp 3/09  . Personal history of prostate cancer   . Dysphagia     with pills  . Hypertension     takes Fosinopril daily  . Sleep apnea     sleep study done in 03/30/04 in epic;doesn't use a CPAP  . Pneumonia     hx of as a child  . Arthritis     back and hip  . Dizziness     pt states when he is hot and stands up too fast  . Back pain     hx of ruptured disc  . Dry skin   . Hemorrhoid     internal   . Hx of colonic polyps   . Nocturia   . Prostate cancer ([place])   . Impaired hearing   . 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 Diabetes mellitus     patient denies Diabetes  . COLONIC POLYPS, HX OF 11/05/2008    Qualifier: Diagnosis of  By: Julaine Hua CMA (AAMA), Estill Bamberg      Social History   Social History  . Marital Status: Married    Spouse Name: N/A  . Number of Children: 3  . Years of Education: N/A   Occupational History  . retired    Social History Main Topics  . Smoking status: Former Smoker    Quit date: 06/22/1961  . Smokeless tobacco: Never Used  . Alcohol Use: Yes     Comment: red wine 3-4 times/wk  . Drug Use: No  . Sexual Activity: Not Currently   Other  Topics Concern  . Not on file   Social History Narrative    Past Surgical History  Procedure Laterality Date  . Lumbar laminectomy  1998  . Knee arthroscopy  2001  . Rotator cuff repair Right 2/10  . Cataract extraction Bilateral   . Tonsillectomy      as a child  . Colonoscopy    . Radioactive seed implant  2008  . Total hip arthroplasty  08/24/2011    Procedure: TOTAL HIP ARTHROPLASTY;  Surgeon: Lorn Junes, MD;  Location: [place];  Service: Orthopedics;  Laterality: Right;  [place]  . Zenker's diverticulectomy  09/02/2011    Procedure: ZENKER'S DIVERTICULECTOMY;  Surgeon: Melida Quitter, MD;  Location: [facility] OR;  Service: ENT;  Laterality: N/A;    Family History  Problem Relation Age of Onset  . Heart disease Father   . Heart attack Father 9  . Colon polyps Daughter   . Colon cancer Neg Hx   . Anesthesia problems Neg Hx   . Hypotension Neg Hx   . Malignant hyperthermia Neg Hx   . Pseudochol deficiency Neg Hx   . Hypertension Mother   .  Cancer Sister     No Known Allergies  Current Outpatient Prescriptions on File Prior to Visit  Medication Sig Dispense Refill  . aspirin 325 MG tablet Take 325 mg by mouth daily.    . carbidopa-levodopa (SINEMET IR) 25-100 MG per tablet Take 1.5 tablets by mouth 3 (three) times daily. 405 tablet 3  . Coenzyme Q10 (CO Q-10) 100 MG CAPS Take 100 mg by mouth 3 (three) times daily.    . ferrous sulfate 325 (65 FE) MG tablet Take 325 mg by mouth daily with breakfast.    . fludrocortisone (FLORINEF) 0.1 MG tablet TAKE 1 TABLET BY MOUTH EVERY DAY 90 tablet 1  . KLOR-CON M10 10 MEQ tablet TAKE 1 TABLET (10 MEQ TOTAL) BY MOUTH DAILY. 90 tablet 3  . midodrine (PROAMATINE) 10 MG tablet Take 1 tablet (10 mg total) by mouth 3 (three) times daily. 270 tablet 6  . Multiple Vitamins-Minerals (CENTRUM SILVER ULTRA WOMENS PO) Take 1 tablet by mouth daily.    . polycarbophil (FIBERCON) 625 MG tablet Take 625 mg by mouth daily.     . vitamin E 400 UNIT capsule Take 400 Units by mouth daily.     Current Facility-Administered Medications on File Prior to Visit  Medication Dose Route Frequency Provider Last Rate Last Dose  . povidone-iodine (BETADINE) 7.5 % scrub   Topical Once Kirstin Shepperson, PA-C        BP 138/84 mmHg  Pulse 75  Temp(Src) 98.3 F (36.8 C) (Oral)  Ht 5\' 8"  (1.727 m)chart    Objective:   Physical Exam  Constitutional: He is oriented to person, place, and time. He appears well-developed and well-nourished.  Neck: Normal range of motion. Neck supple. No thyromegaly present.  Cardiovascular: Normal rate, regular rhythm and normal heart sounds.   Pulmonary/Chest: Effort normal and breath sounds normal.  Abdominal: Soft. Bowel sounds are normal.  Musculoskeletal: Normal range of motion.  Neurological: He is alert and oriented to person, place, and time.  Skin: Skin is warm and dry.  Psychiatric:  Flat affect          Assessment & Plan:  Darius Castro was seen today for medication refill.  Diagnoses and all orders for this visit:  Parkinson disease ([place]) -     TSH -     Basic Metabolic Panel -     CBC with Differential  Depression -     TSH -     Basic Metabolic Panel -     CBC with Differential  Generalized anxiety disorder -     TSH -     Basic Metabolic Panel -     CBC with Differential  Other orders -     PARoxetine (PAXIL) 20 MG tablet; Take 1 tablet (20 mg total) by mouth daily.   Call the office with any questions or concerns. Recheck in 3 weeks and sooner as needed.

## 2015-04-08 NOTE — Progress Notes (Signed)
Patient ID: Darius Castro, male   DOB: 07-28-1923, 81 y.o.   MRN: [id number]     Patient Name: DELDRICK Castro Date of Encounter: 05/31/2014  Primary Care Provider: Garret Reddish, MD Primary Cardiologist: Darius Castro (previously Darius Castro)   Chief complain: Dizziness  Problem List  Past Medical History  Diagnosis Date  . Hyperlipidemia     takes Lipitor daily  . Adenomatous colon polyp 3/09  . Personal history of prostate cancer   . Dysphagia     with pills  . Hypertension     takes Fosinopril daily  . Sleep apnea     sleep study done in 03/30/04 in epic;doesn't use a CPAP  . Pneumonia     hx of as a child  . Arthritis     back and hip  . Dizziness     pt states when he is hot and stands up too fast  . Back pain     hx of ruptured disc  . Dry skin   . Hemorrhoid     internal   . Hx of colonic polyps   . Nocturia   . Prostate cancer   . Impaired hearing   . 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 Diabetes mellitus     patient denies Diabetes  . COLONIC POLYPS, HX OF 11/05/2008    Qualifier: Diagnosis of By: Darius Castro CMA (AAMA), Darius Castro    Past Surgical History  Procedure Laterality Date  . Lumbar laminectomy  1998  . Knee arthroscopy  2001  . Rotator cuff repair Right 2/10  . Cataract extraction Bilateral   . Tonsillectomy      as a child  . Colonoscopy    . Radioactive seed implant  2008  . Total hip arthroplasty  08/24/2011    Procedure: TOTAL HIP ARTHROPLASTY; Surgeon: Darius Junes, MD; Location: [place]; Service: Orthopedics; Laterality: Right; Darius [place]  . Zenker's diverticulectomy  09/02/2011    Procedure: ZENKER'S DIVERTICULECTOMY; Surgeon: Darius Quitter, MD; Location: [facility] OR; Service: ENT; Laterality: N/A;    Allergies  No Known Allergies  HPI  A 81 year  old male with h/o hyperlipidemia, hypertension who is coming with the main concern of dizziness on a daily basis especially when he stands up too fast. Going off the BP med 2 wks ago is not what is making him dizzy, he states this has been going on 2 yrs, they think it could be his migraine med which on the bottle states it could cause dizziness topiramate.  Pt stays dizzy all the time. Even when he goes to exercise, he has to sit down cause he's dizzy.  He reports no falls, no palpitations, chest pain or shortness of breath. No syncope.  He is currently taking fludrocortisone 0.1 mg daily.  Orthostatics today: Lying: 181/83, HR 73 Sitting: 152/79, HR 75 Standing: 104/55, HR 73   Stop ACE inhibitor at the last visit and started beta blocker. However patient states that his symptoms got even worse. He brings a letter from his physical therapist that your not able to perform any therapy as he feels dizzy every time he stands up.  01/30/2014 - at the last visit we stopped the beta blockers and And increased fludrocortisone to 0.2 mg daily. His BP got to 160 at rest and 190-200 during exertion. We decreased fludrocortisone to 0.1 mg daily 2 weeks ago and he started to feel significantly better however his blood pressure still goes  up when he tries to exert himself. We will add metoprolol 6.25 mg twice a day. The family is concerned about anemia. Last hemoglobin was 12.5  05/31/2014 - the patient is coming after 4 months. Since the last visit he experienced another on falls with no significant trauma. Some days he feels dizzy most of the time but overall his symptoms have remained pretty much the same. He denies any chest pain, shortness of breath no lower extremity edema. He has been drinking 8 glasses of water a day and is compliant with his medication regimen.  09/24/14 - the patient is feeling well, 1 recent fall, he doesn't remember circumstances, he continues to feel dizzy, however its improved  on midodrine 5 mg po TID. Northera wasn't approved by [facility]. He denies DOE, CP< lower extremity edema.   04/08/2015 - 6 months follow up, stable dizziness, but happens in clusters, he is quite depressed about it and was started on Paxil recently. He was used to a very active lifestyle, and is not able to do much. His son is planning to move him in with him.  Home Medications  Prior to Admission medications   Medication Sig Start Date End Date Taking? Authorizing Provider  aspirin 325 MG tablet Take 325 mg by mouth daily.   Yes Historical Provider, MD  carbidopa-levodopa (SINEMET IR) 25-100 MG per tablet Take 1 tablet by mouth 3 (three) times daily. 08/23/13  Yes Historical Provider, MD  fludrocortisone (FLORINEF) 0.1 MG tablet Take 1 tablet (0.1 mg total) by mouth daily. 10/03/13  Yes Darius S Tat, DO  fosinopril (MONOPRIL) 20 MG tablet  08/23/13  Yes Historical Provider, MD  polycarbophil (FIBERCON) 625 MG tablet Take 625 mg by mouth daily.   Yes Historical Provider, MD  topiramate (TOPAMAX) 25 MG tablet Take 25 mg by mouth daily.    Yes Historical Provider, MD    Family History  Family History  Problem Relation Age of Onset  . Heart disease Father   . Heart attack Father 20  . Colon polyps Daughter   . Colon cancer Neg Hx   . Anesthesia problems Neg Hx   . Hypotension Neg Hx   . Malignant hyperthermia Neg Hx   . Pseudochol deficiency Neg Hx   . Hypertension Mother   . Cancer Sister     Social History  History   Social History  . Marital Status: Married    Spouse Name: N/A    Number of Children: 3  . Years of Education: N/A   Occupational History  . retired    Social History Main Topics  . Smoking status: Former Smoker    Quit date: 06/22/1961  . Smokeless tobacco: Never Used  . Alcohol Use: Yes     Comment: red wine 3-4  times/wk  . Drug Use: No  . Sexual Activity: Not Currently   Other Topics Concern  . Not on file   Social History Narrative     Review of Systems, as per HPI, otherwise negative General: No chills, fever, night sweats or weight changes.  Cardiovascular: No chest pain, dyspnea on exertion, edema, orthopnea, palpitations, paroxysmal nocturnal dyspnea. Dermatological: No rash, lesions/masses Respiratory: No cough, dyspnea Urologic: No hematuria, dysuria Abdominal: No nausea, vomiting, diarrhea, bright red blood per rectum, melena, or hematemesis Neurologic: No visual changes, wkns, changes in mental status. All other systems reviewed and are otherwise negative except as noted above.  Physical Exam  Blood pressure 109/56, pulse 78, height 5\' 8"  (1.727  m), weight 150 lb (68.04 kg), SpO2 97 %.  General: Pleasant, NAD Psych: Normal affect. Neuro: Alert and oriented X 3. Moves all extremities spontaneously. HEENT: Normal Neck: Supple without bruits or JVD. Lungs: Resp regular and unlabored, CTA. Heart: RRR no s3, s4, or 2/6 systolic murmur at the LLSB. Abdomen: Soft, non-tender, non-distended, BS + x 4.  Extremities: No clubbing, cyanosis or edema. DP/PT/Radials 2+ and equal bilaterally.  Labs:   Recent Labs (last 2 labs)     No results for input(s): CKTOTAL, CKMB, TROPONINI in the last 72 hours.   Lab Results  Component Value Date   WBC 7.6 01/30/2014   HGB 12.8* 01/30/2014   HCT 38.5* 01/30/2014   MCV 96.2 01/30/2014   PLT 239.0 01/30/2014    Lab Results  Component Value Date   DDIMER 0.73* 05/30/2012    Last Labs     Invalid input(s): POCBNP    Labs (Brief)       Component Value Date/Time   NA 138 01/30/2014 0906   K 4.4 01/30/2014 0906   CL 99 01/30/2014 0906   CO2 27 01/30/2014 0906   GLUCOSE 104* 01/30/2014 0906   BUN 20 01/30/2014 0906   CREATININE 1.1  01/30/2014 0906   CALCIUM 9.2 01/30/2014 0906   PROT 6.6 01/30/2014 0906   ALBUMIN 4.1 01/30/2014 0906   AST 21 01/30/2014 0906   ALT 5 01/30/2014 0906   ALKPHOS 41 01/30/2014 0906   BILITOT 0.8 01/30/2014 0906   GFRNONAA 72* 11/15/2013 1230   GFRAA 84* 11/15/2013 1230     Lab Results  Component Value Date   CHOL 155 05/30/2012   HDL 68.90 05/30/2012   LDLCALC 75 05/30/2012   TRIG 57.0 05/30/2012    Accessory Clinical Findings  Echocardiogram - 2012 - Left ventricle: The cavity size was normal. There was mild focal basal hypertrophy of the septum. Systolic function was normal. The estimated ejection fraction was in the range of 55% to 65%. Wall motion was normal; there were no regional wall motion abnormalities. Doppler parameters are consistent with abnormal left ventricular relaxation (grade 1 diastolic dysfunction). - Aortic valve: Trivial regurgitation. - Mitral valve: Mild regurgitation. - Tricuspid valve: Moderate regurgitation. - Pulmonary arteries: Systolic pressure was moderately increased. PA peak pressure: 32mm Hg (S).  02/12/2014 - Left ventricle: The cavity size was normal. There was mild focal basal hypertrophy of the septum. Systolic function was normal. The estimated ejection fraction was in the range of 55% to 60%. Wall motion was normal; there were no regional wall motion abnormalities. Doppler parameters are consistent with abnormal left ventricular relaxation (grade 1 diastolic dysfunction). - Pulmonary arteries: Systolic pressure was mildly increased. PA peak pressure: 35 mm Hg (S).  Impressions: - Normal LV function; grade 1 diastolic dysfunction; mildly elevated pulmonary pressures.  ECG - SR, PACs, nonspecific T wave abnormalities   Assessment & Plan  81 year old male   1. Orthostatic hypotension - primary autonomic failure sec to Parkinson disease - d/c-ed ACEI, we  discontinued metoprolol He didn't tolerate fludrocortisone to 0.2 mg daily due to BP 190-200 during exertion. Droxidopa (Northera) wasn't approved by [facility].  Improved on midodrine 10 mg po TID, take an extra 5 mg PRN for dizziness, also on Florinef 0.1 mg po daily, the son instructed to increase to 0.2 if his dizziness and falls worsen but monitor his BP and decrease if his BP goes above 160 mmHg.  2. Chronic CHF with preserved LVEF - stable, asymptomatic  3.  Pulmonary HTN - repeat echocardiogram shows only borderline RVSP.  4. History of anemia - the family is concerned about low hemoglobin contributing to his symptoms, they were giving him iron pills. His last hemoglobin was 12.4 today.     Followup in 6 months.  Darius Castro 04/08/2015

## 2015-04-09 ENCOUNTER — Ambulatory Visit: Payer: Medicare Other | Admitting: Cardiology

## 2015-04-09 ENCOUNTER — Telehealth: Payer: Self-pay | Admitting: Neurology

## 2015-04-09 NOTE — Telephone Encounter (Signed)
Spoke with patient's daughter. She did not know he had fell. She states patient's son was with him all day yesterday. She will check on him and make sure no injury/head trauma. I did try to call the physical therapist with no answer. Patient had appt with cardiology yesterday who is managing dizziness.

## 2015-04-09 NOTE — Telephone Encounter (Signed)
PT's physical therapist Stanton Kidney called and said PT fell yesterday and is really dizzy/Darius Castro CB# [phone]

## 2015-04-13 ENCOUNTER — Emergency Department (HOSPITAL_COMMUNITY): Payer: Medicare Other

## 2015-04-13 ENCOUNTER — Inpatient Hospital Stay (HOSPITAL_COMMUNITY)
Admission: EM | Admit: 2015-04-13 | Discharge: 2015-04-16 | DRG: 565 | Disposition: A | Discharge status: Nursing Home (Includes ICF) | Payer: Medicare Other | Attending: Internal Medicine | Admitting: Internal Medicine

## 2015-04-13 ENCOUNTER — Encounter (HOSPITAL_COMMUNITY): Payer: Self-pay

## 2015-04-13 DIAGNOSIS — I951 Orthostatic hypotension: Secondary | ICD-10-CM | POA: Diagnosis not present

## 2015-04-13 DIAGNOSIS — G20 Parkinson's disease: Secondary | ICD-10-CM | POA: Diagnosis present

## 2015-04-13 DIAGNOSIS — S0093XA Contusion of unspecified part of head, initial encounter: Secondary | ICD-10-CM | POA: Diagnosis present

## 2015-04-13 DIAGNOSIS — Z96641 Presence of right artificial hip joint: Secondary | ICD-10-CM | POA: Diagnosis present

## 2015-04-13 DIAGNOSIS — K592 Neurogenic bowel, not elsewhere classified: Secondary | ICD-10-CM | POA: Diagnosis present

## 2015-04-13 DIAGNOSIS — G4730 Sleep apnea, unspecified: Secondary | ICD-10-CM | POA: Diagnosis present

## 2015-04-13 DIAGNOSIS — M6282 Rhabdomyolysis: Secondary | ICD-10-CM | POA: Diagnosis present

## 2015-04-13 DIAGNOSIS — Y92002 Bathroom of unspecified non-institutional (private) residence single-family (private) house as the place of occurrence of the external cause: Secondary | ICD-10-CM

## 2015-04-13 DIAGNOSIS — I10 Essential (primary) hypertension: Secondary | ICD-10-CM | POA: Diagnosis present

## 2015-04-13 DIAGNOSIS — Z87891 Personal history of nicotine dependence: Secondary | ICD-10-CM | POA: Diagnosis not present

## 2015-04-13 DIAGNOSIS — N179 Acute kidney failure, unspecified: Secondary | ICD-10-CM | POA: Diagnosis not present

## 2015-04-13 DIAGNOSIS — E871 Hypo-osmolality and hyponatremia: Secondary | ICD-10-CM | POA: Diagnosis present

## 2015-04-13 DIAGNOSIS — Z79899 Other long term (current) drug therapy: Secondary | ICD-10-CM

## 2015-04-13 DIAGNOSIS — E785 Hyperlipidemia, unspecified: Secondary | ICD-10-CM | POA: Diagnosis present

## 2015-04-13 DIAGNOSIS — Z7982 Long term (current) use of aspirin: Secondary | ICD-10-CM

## 2015-04-13 DIAGNOSIS — Z9181 History of falling: Secondary | ICD-10-CM

## 2015-04-13 DIAGNOSIS — S0181XA Laceration without foreign body of other part of head, initial encounter: Secondary | ICD-10-CM | POA: Diagnosis present

## 2015-04-13 DIAGNOSIS — K5900 Constipation, unspecified: Secondary | ICD-10-CM | POA: Diagnosis not present

## 2015-04-13 DIAGNOSIS — E860 Dehydration: Secondary | ICD-10-CM | POA: Diagnosis not present

## 2015-04-13 DIAGNOSIS — T796XXA Traumatic ischemia of muscle, initial encounter: Principal | ICD-10-CM | POA: Diagnosis present

## 2015-04-13 DIAGNOSIS — Z9841 Cataract extraction status, right eye: Secondary | ICD-10-CM

## 2015-04-13 DIAGNOSIS — Z8546 Personal history of malignant neoplasm of prostate: Secondary | ICD-10-CM

## 2015-04-13 DIAGNOSIS — M1990 Unspecified osteoarthritis, unspecified site: Secondary | ICD-10-CM | POA: Diagnosis present

## 2015-04-13 DIAGNOSIS — Z9842 Cataract extraction status, left eye: Secondary | ICD-10-CM | POA: Diagnosis not present

## 2015-04-13 DIAGNOSIS — D649 Anemia, unspecified: Secondary | ICD-10-CM

## 2015-04-13 DIAGNOSIS — N170 Acute kidney failure with tubular necrosis: Secondary | ICD-10-CM | POA: Diagnosis not present

## 2015-04-13 DIAGNOSIS — W1830XA Fall on same level, unspecified, initial encounter: Secondary | ICD-10-CM | POA: Diagnosis present

## 2015-04-13 DIAGNOSIS — R296 Repeated falls: Secondary | ICD-10-CM | POA: Diagnosis present

## 2015-04-13 DIAGNOSIS — H9190 Unspecified hearing loss, unspecified ear: Secondary | ICD-10-CM | POA: Diagnosis present

## 2015-04-13 DIAGNOSIS — D638 Anemia in other chronic diseases classified elsewhere: Secondary | ICD-10-CM | POA: Diagnosis present

## 2015-04-13 DIAGNOSIS — R51 Headache: Secondary | ICD-10-CM | POA: Diagnosis present

## 2015-04-13 DIAGNOSIS — I509 Heart failure, unspecified: Secondary | ICD-10-CM | POA: Diagnosis not present

## 2015-04-13 LAB — BASIC METABOLIC PANEL
ANION GAP: 13 (ref 5–15)
BUN: 30 mg/dL — ABNORMAL HIGH (ref 6–20)
CO2: 25 mmol/L (ref 22–32)
Calcium: 8.6 mg/dL — ABNORMAL LOW (ref 8.9–10.3)
Chloride: 91 mmol/L — ABNORMAL LOW (ref 101–111)
Creatinine, Ser: 1.97 mg/dL — ABNORMAL HIGH (ref 0.61–1.24)
GFR calc Af Amer: 33 mL/min — ABNORMAL LOW (ref >60)
GFR, EST NON AFRICAN AMERICAN: 28 mL/min — AB (ref >60)
GLUCOSE: 139 mg/dL — AB (ref 65–99)
POTASSIUM: 4 mmol/L (ref 3.5–5.1)
Sodium: 129 mmol/L — ABNORMAL LOW (ref 135–145)

## 2015-04-13 LAB — CBC WITH DIFFERENTIAL/PLATELET
BASOS ABS: 0 10*3/uL (ref 0.0–0.1)
Basophils Relative: 0 %
Eosinophils Absolute: 0 10*3/uL (ref 0.0–0.7)
Eosinophils Relative: 0 %
HEMATOCRIT: 29.7 % — AB (ref 39.0–52.0)
Hemoglobin: 10.2 g/dL — ABNORMAL LOW (ref 13.0–17.0)
LYMPHS PCT: 6 %
Lymphs Abs: 0.7 10*3/uL (ref 0.7–4.0)
MCH: 31.3 pg (ref 26.0–34.0)
MCHC: 34.3 g/dL (ref 30.0–36.0)
MCV: 91.1 fL (ref 78.0–100.0)
Monocytes Absolute: 0.7 10*3/uL (ref 0.1–1.0)
Monocytes Relative: 6 %
NEUTROS ABS: 11.1 10*3/uL — AB (ref 1.7–7.7)
NEUTROS PCT: 88 %
PLATELETS: 221 10*3/uL (ref 150–400)
RBC: 3.26 MIL/uL — AB (ref 4.22–5.81)
RDW: 12.6 % (ref 11.5–15.5)
WBC: 12.5 10*3/uL — AB (ref 4.0–10.5)

## 2015-04-13 LAB — TSH: TSH: 1.846 u[IU]/mL (ref 0.350–4.500)

## 2015-04-13 LAB — CK: Total CK: 1015 U/L — ABNORMAL HIGH (ref 49–397)

## 2015-04-13 LAB — RETICULOCYTES
RBC.: 3.18 MIL/uL — AB (ref 4.22–5.81)
RETIC CT PCT: 1.6 % (ref 0.4–3.1)
Retic Count, Absolute: 50.9 10*3/uL (ref 19.0–186.0)

## 2015-04-13 LAB — VITAMIN B12: VITAMIN B 12: 705 pg/mL (ref 180–914)

## 2015-04-13 LAB — IRON AND TIBC
Iron: 34 ug/dL — ABNORMAL LOW (ref 45–182)
SATURATION RATIOS: 11 % — AB (ref 17.9–39.5)
TIBC: 308 ug/dL (ref 250–450)
UIBC: 274 ug/dL

## 2015-04-13 LAB — OSMOLALITY: Osmolality: 281 mOsm/kg (ref 275–300)

## 2015-04-13 LAB — GLUCOSE, CAPILLARY: Glucose-Capillary: 121 mg/dL — ABNORMAL HIGH (ref 65–99)

## 2015-04-13 LAB — FERRITIN: Ferritin: 151 ng/mL (ref 24–336)

## 2015-04-13 LAB — FOLATE: Folate: 38.4 ng/mL (ref >5.9)

## 2015-04-13 MED ORDER — ACETAMINOPHEN 650 MG RE SUPP: 650.0000 mg | Freq: Four times a day (QID) | RECTAL | Status: DC | PRN

## 2015-04-13 MED ORDER — SODIUM CHLORIDE 0.9 % IV BOLUS (SEPSIS)
1000.0000 mL | Freq: Once | INTRAVENOUS | Status: AC
  Administered 2015-04-13: 1000 mL via INTRAVENOUS

## 2015-04-13 MED ORDER — MIDODRINE HCL 5 MG PO TABS: 10.0000 mg | ORAL_TABLET | Freq: Three times a day (TID) | ORAL | Status: DC

## 2015-04-13 MED ORDER — SODIUM CHLORIDE 0.9 % IV SOLN
INTRAVENOUS | Status: DC
  Administered 2015-04-13: 19:00:00 via INTRAVENOUS

## 2015-04-13 MED ORDER — INFLUENZA VAC SPLIT QUAD 0.5 ML IM SUSY
0.5000 mL | PREFILLED_SYRINGE | INTRAMUSCULAR | Status: AC
  Administered 2015-04-15: 0.5 mL via INTRAMUSCULAR
  Filled 2015-04-13: qty 0.5

## 2015-04-13 MED ORDER — CO Q-10 100 MG PO CAPS: 100.0000 mg | ORAL_CAPSULE | Freq: Three times a day (TID) | ORAL | Status: DC

## 2015-04-13 MED ORDER — FERROUS SULFATE 325 (65 FE) MG PO TABS
325.0000 mg | ORAL_TABLET | Freq: Every day | ORAL | Status: DC
  Administered 2015-04-14 – 2015-04-16 (×3): 325 mg via ORAL
  Filled 2015-04-13 (×3): qty 1

## 2015-04-13 MED ORDER — ASPIRIN 325 MG PO TABS
325.0000 mg | ORAL_TABLET | Freq: Every day | ORAL | Status: DC
  Administered 2015-04-14 – 2015-04-16 (×3): 325 mg via ORAL
  Filled 2015-04-13 (×3): qty 1

## 2015-04-13 MED ORDER — ONDANSETRON HCL 4 MG PO TABS: 4.0000 mg | ORAL_TABLET | Freq: Four times a day (QID) | ORAL | Status: DC | PRN

## 2015-04-13 MED ORDER — ACETAMINOPHEN 325 MG PO TABS: 650.0000 mg | ORAL_TABLET | Freq: Four times a day (QID) | ORAL | Status: DC | PRN

## 2015-04-13 MED ORDER — ONDANSETRON HCL 4 MG/2ML IJ SOLN: 4.0000 mg | Freq: Four times a day (QID) | INTRAMUSCULAR | Status: DC | PRN

## 2015-04-13 MED ORDER — CALCIUM POLYCARBOPHIL 625 MG PO TABS
625.0000 mg | ORAL_TABLET | Freq: Every day | ORAL | Status: DC
  Administered 2015-04-14 – 2015-04-16 (×3): 625 mg via ORAL
  Filled 2015-04-13 (×3): qty 1

## 2015-04-13 MED ORDER — ALUM & MAG HYDROXIDE-SIMETH 200-200-20 MG/5ML PO SUSP: 30.0000 mL | Freq: Four times a day (QID) | ORAL | Status: DC | PRN

## 2015-04-13 MED ORDER — BACITRACIN ZINC 500 UNIT/GM EX OINT
TOPICAL_OINTMENT | Freq: Two times a day (BID) | CUTANEOUS | Status: DC
  Administered 2015-04-13 – 2015-04-16 (×6): via TOPICAL
  Filled 2015-04-13: qty 28.35

## 2015-04-13 MED ORDER — PAROXETINE HCL 20 MG PO TABS
20.0000 mg | ORAL_TABLET | Freq: Every day | ORAL | Status: DC
  Administered 2015-04-14 – 2015-04-16 (×3): 20 mg via ORAL
  Filled 2015-04-13 (×3): qty 1

## 2015-04-13 MED ORDER — POLYETHYLENE GLYCOL 3350 17 G PO PACK: 17.0000 g | PACK | Freq: Every day | ORAL | Status: DC | PRN

## 2015-04-13 MED ORDER — OXYCODONE HCL 5 MG PO TABS: 5.0000 mg | ORAL_TABLET | ORAL | Status: DC | PRN

## 2015-04-13 MED ORDER — FLUDROCORTISONE ACETATE 0.1 MG PO TABS
100.0000 ug | ORAL_TABLET | Freq: Every day | ORAL | Status: DC
  Administered 2015-04-14 – 2015-04-15 (×2): 100 ug via ORAL
  Filled 2015-04-13 (×2): qty 1

## 2015-04-13 MED ORDER — MIDODRINE HCL 5 MG PO TABS
10.0000 mg | ORAL_TABLET | Freq: Three times a day (TID) | ORAL | Status: DC
  Administered 2015-04-14 – 2015-04-16 (×9): 10 mg via ORAL
  Filled 2015-04-13 (×8): qty 2

## 2015-04-13 MED ORDER — CARBIDOPA-LEVODOPA 25-100 MG PO TABS
1.5000 | ORAL_TABLET | Freq: Three times a day (TID) | ORAL | Status: DC
  Administered 2015-04-13 – 2015-04-16 (×9): 1.5 via ORAL
  Filled 2015-04-13 (×12): qty 1.5

## 2015-04-13 MED ORDER — DOCUSATE SODIUM 100 MG PO CAPS
100.0000 mg | ORAL_CAPSULE | Freq: Two times a day (BID) | ORAL | Status: DC
  Administered 2015-04-13 – 2015-04-16 (×5): 100 mg via ORAL
  Filled 2015-04-13 (×5): qty 1

## 2015-04-13 MED ORDER — POTASSIUM CHLORIDE CRYS ER 20 MEQ PO TBCR: 10.0000 meq | EXTENDED_RELEASE_TABLET | Freq: Every day | ORAL | Status: DC

## 2015-04-13 NOTE — ED Notes (Signed)
MD at bedside. 

## 2015-04-13 NOTE — Progress Notes (Signed)
CSW went to speak with patient and family. CSW introduced self and acknowledged the patient. Patient was alert and able to answer questions. Patient's daughter Rise Paganini and her husband were at bedside. Patient is being admitted for rhabdomyolysis. Daughter informed CSW and CM that the patient has been receiving HH but was unable to provide name of agency. Daughter appeared to be overwhelmed. Daughter was informed that once patient is admitted, there will be a CSW following the patient in regards to disposition. Daughter was appreciative of conversation with CSW and CM. No further needs reported at this time. Please consult if further CSW needs arise.   Lucius Conn, [facility] Ph: [phone]

## 2015-04-13 NOTE — Progress Notes (Signed)
Called ED for report.  Spoke with Hildred Alamin.

## 2015-04-13 NOTE — ED Notes (Addendum)
Pt has Parkinsons and had unwitnessed fall in bathroom around 0330 this morning but wife did not call family. Family found pt when they came over to visit. Pt hit head, LOC unknown. Lac to left eyebrow; Swelling and bruising to left eye socket; Bruising around right eye sockets; Lac to right knee; Bruising to right foot; Bruising to anterior pelvic area.   Pt hx intermittent dizzy spells x 2 years.

## 2015-04-13 NOTE — H&P (Signed)
[facility] History and Physical                                                                                    Darius Castro, is a 81 y.o. male  MRN: [id number]   DOB - 11-Mar-1924  Admit Date - 04/13/2015  Outpatient Primary MD for the patient is Garret Reddish, MD  Referring Physician:  Dr. Maryan Rued  Chief Complaint:   Chief Complaint  Patient presents with  . Fall     HPI  Darius Castro  is a 81 y.o. male, with Parkinson's, autonomic orthostatic hypotension, mild cognitive deficit and history of multiple falls. He was brought to the ER after a fall. History was gathered from his daughter who states that the patient got up to go to the bathroom between 3 and 4 AM this morning he fell and hit his head. The patient's wife (who is 34 years old) called the patient's daughter for help at approximately 10 AM this morning as she could not get him to stand up.  The patient has had no recent signs of illness: No chest pain, difficulty breathing, cough, congestion, abdominal pain, changes in bowel habits, or dysuria. In the emergency department the patient is found to have an elevated creatinine of 1.9 (baseline 1.1), he is dry with a sodium of 129 and a chloride of 91. CK is 1015. Fortunately all imaging is negative for fracture or displacement. UA is pending. We will admit the patient for acute renal failure. He will likely need short-term SNF placement in [facility].   Review of Systems  Constitutional: Negative for fever, chills, weight loss, malaise/fatigue and diaphoresis.  HENT: Negative for congestion, ear discharge, ear pain, hearing loss, nosebleeds, sore throat and tinnitus.   Eyes: Negative for blurred vision, double vision, photophobia, pain, discharge and redness.  Respiratory: Negative.  Negative for stridor.   Cardiovascular: Negative.   Gastrointestinal: Negative.   Genitourinary: Negative.   Musculoskeletal: Positive for falls. Negative for myalgias, back pain,  joint pain and neck pain.  Skin: Negative for itching and rash.  Neurological: Positive for dizziness. Negative for tingling, tremors, sensory change, speech change, focal weakness, seizures, loss of consciousness and headaches.  Endo/Heme/Allergies: Negative.   Psychiatric/Behavioral: Positive for memory loss. Negative for depression, suicidal ideas, hallucinations and substance abuse. The patient is not nervous/anxious and does not have insomnia.       Past Medical History  Past Medical History  Diagnosis Date  . Hyperlipidemia     takes Lipitor daily  . Adenomatous colon polyp 3/09  . Personal history of prostate cancer   . Dysphagia     with pills  . Hypertension     takes Fosinopril daily  . Sleep apnea     sleep study done in 03/30/04 in epic;doesn't use a CPAP  . Pneumonia     hx of as a child  . Arthritis     back and hip  . Dizziness     pt states when he is hot and stands up too fast  . Back pain     hx of ruptured disc  . Dry skin   .  Hemorrhoid     internal   . Hx of colonic polyps   . Nocturia   . Prostate cancer ([place])   . Impaired hearing   . 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 Diabetes mellitus     patient denies Diabetes  . COLONIC POLYPS, HX OF 11/05/2008    Qualifier: Diagnosis of  By: Julaine Hua CMA (AAMA), Estill Bamberg      Past Surgical History  Procedure Laterality Date  . Lumbar laminectomy  1998  . Knee arthroscopy  2001  . Rotator cuff repair Right 2/10  . Cataract extraction Bilateral   . Tonsillectomy      as a child  . Colonoscopy    . Radioactive seed implant  2008  . Total hip arthroplasty  08/24/2011    Procedure: TOTAL HIP ARTHROPLASTY;  Surgeon: Lorn Junes, MD;  Location: [facility];  Service: Orthopedics;  Laterality: Right;  DR [place]  . Zenker's diverticulectomy  09/02/2011    Procedure: ZENKER'S DIVERTICULECTOMY;  Surgeon: Melida Quitter, MD;  Location[facility];  Service: ENT;  Laterality: N/A;       Social History Social History  Substance Use Topics  . Smoking status: Former Smoker    Quit date: 06/22/1961  . Smokeless tobacco: Never Used  . Alcohol Use: Yes     Comment: red wine 3-4 times/wk   has a history of tobacco use but stopped many years ago. Does not drink alcohol any longer  Family History Family History  Problem Relation Age of Onset  . Heart disease Father   . Heart attack Father 66  . Colon polyps Daughter   . Colon cancer Neg Hx   . Anesthesia problems Neg Hx   . Hypotension Neg Hx   . Malignant hyperthermia Neg Hx   . Pseudochol deficiency Neg Hx   . Hypertension Mother   . Cancer Sister     Prior to Admission medications   Medication Sig Start Date End Date Taking? Authorizing Provider  aspirin 325 MG tablet Take 325 mg by mouth daily.   Yes Historical Provider, MD  carbidopa-levodopa (SINEMET IR) 25-100 MG per tablet Take 1.5 tablets by mouth 3 (three) times daily. 02/01/15  Yes Rebecca S Tat, DO  Coenzyme Q10 (CO Q-10) 100 MG CAPS Take 100 mg by mouth 3 (three) times daily.   Yes Historical Provider, MD  ferrous sulfate 325 (65 FE) MG tablet Take 325 mg by mouth daily with breakfast.   Yes Historical Provider, MD  fludrocortisone (FLORINEF) 0.1 MG tablet TAKE 1 TABLET BY MOUTH EVERY DAY 03/05/15  Yes Dorothy Spark, MD  KLOR-CON M10 10 MEQ tablet TAKE 1 TABLET (10 MEQ TOTAL) BY MOUTH DAILY. 10/15/14  Yes Dorothy Spark, MD  midodrine (PROAMATINE) 10 MG tablet Take 1 tablet (10 mg total) by mouth 3 (three) times daily. 09/24/14  Yes Dorothy Spark, MD  Multiple Vitamins-Minerals (CENTRUM SILVER ULTRA WOMENS PO) Take 1 tablet by mouth daily.   Yes Historical Provider, MD  PARoxetine (PAXIL) 20 MG tablet Take 1 tablet (20 mg total) by mouth daily. 04/08/15  Yes Kennyth Arnold, FNP  polycarbophil (FIBERCON) 625 MG tablet Take 625 mg by mouth daily.   Yes Historical Provider, MD     Physical Exam  Vitals  Blood pressure 129/96, pulse 69,  temperature 97.6 F (36.4 C), temperature source Oral, resp. rate 22, SpO2 96 %.   General:  lying in bed in NAD, daughter at bedside  Psych:  Patient with mild cognitive deficit, Not Suicidal or Homicidal, Awake   Neuro:   No F.N deficits, ALL C.Nerves Intact, Strength 5/5 all 4 extremities, Sensation intact all 4 extremities.  ENT:  Ears and Eyes appear Normal, Conjunctivae clear, PER. Moist oral mucosa without erythema or exudates.  Neck:  Supple, No lymphadenopathy appreciated  Respiratory:  Symmetrical chest wall movement, Good air movement bilaterally, CTAB.  Cardiac:  RRR, No Murmurs, no LE edema noted, no JVD.    Abdomen:  Positive bowel sounds, Soft, Non tender, Non distended,  No masses appreciated  Skin:  Significant hematoma covering his forehead, multiple scrapes on his knees  Extremities:  Able to move all 4. 5/5 strength in each,  no effusions.  Data Review  Wt Readings from Last 3 Encounters:  09/24/14 72.122 kg (159 lb)  06/29/14 69.854 kg (154 lb)  05/31/14 68.493 kg (151 lb)    CBC  Recent Labs Lab 04/08/15 1123 04/13/15 1429  WBC 7.7 12.5*  HGB 12.7* 10.2*  HCT 38.0* 29.7*  PLT 281.0 221  MCV 94.2 91.1  MCH  --  31.3  MCHC 33.3 34.3  RDW 13.4 12.6  LYMPHSABS 1.1 0.7  MONOABS 0.7 0.7  EOSABS 0.1 0.0  BASOSABS 0.0 0.0    Chemistries   Recent Labs Lab 04/08/15 1123 04/13/15 1429  NA 140 129*  K 4.0 4.0  CL 100 91*  CO2 30 25  GLUCOSE 104* 139*  BUN 20 30*  CREATININE 1.08 1.97*  CALCIUM 9.2 8.6*       Lab Results  Component Value Date   HGBA1C 6.2 05/30/2012    Urinalysis: Pending  Imaging results:   Ct Head Wo Contrast  04/13/2015  CLINICAL DATA:  Unwitnessed fall in the bathroom this morning, landing face down on the floor. Swelling and bruising around both eyes and nose. Uncertain loss of consciousness. Initial encounter. EXAM: CT HEAD WITHOUT CONTRAST CT MAXILLOFACIAL WITHOUT CONTRAST CT CERVICAL SPINE WITHOUT  CONTRAST TECHNIQUE: Multidetector CT imaging of the head, cervical spine, and maxillofacial structures were performed using the standard protocol without intravenous contrast. Multiplanar CT image reconstructions of the cervical spine and maxillofacial structures were also generated. COMPARISON:  Head CT 02/01/2015. Head, cervical, and maxillofacial CT 09/14/2013. FINDINGS: CT HEAD FINDINGS There is no evidence of acute cortical infarct, intracranial hemorrhage, mass, midline shift, or extra-axial fluid collection. Moderate cerebral atrophy is unchanged. Periventricular white-matter hypodensities are unchanged and nonspecific but compatible with mild-to-moderate chronic small vessel ischemic disease. Prior bilateral cataract extraction. Frontal scalp swelling/hematoma. Left maxillary sinus mucous retention cyst. Trace right mastoid effusion. No skull fracture. CT MAXILLOFACIAL FINDINGS There is soft tissue swelling/hematoma involving the frontal scalp soft tissues. No acute maxillofacial fracture is identified. Slight nasal bone deformity is chronic. Prior bilateral cataract extraction is noted. Small right sphenoid and moderate left maxillary sinus mucous retention cysts are present. There is a trace right mastoid effusion. CT CERVICAL SPINE FINDINGS Vertebral alignment is unchanged, with trace anterolisthesis again seen of C4 on C5 and C7 on T1. Multilevel disc space narrowing does not appear significantly changed, severe at C5-6, C6-7, and T1-2. Mild, chronic T1 superior endplate compression fracture is unchanged. Facet ankylosis is noted on the right at C3-4 and bilaterally at C4-5. Prominent calcified pannus is again seen posterior to the dens with associated erosions of the dens as well as C1 lateral masses. Mild posterior subluxation of the right lateral mass with respect to C2 is unchanged. No acute cervical spine fracture  is identified. Mild centrilobular emphysema is partially visualized in the lung  apices. IMPRESSION: 1. No evidence of acute intracranial abnormality. 2. Moderate cerebral atrophy and mild-to-moderate chronic small vessel ischemic disease. 3. Frontal scalp hematoma.  No acute maxillofacial fracture. 4. No acute osseous abnormality in the cervical spine. Electronically Signed   By: Logan Bores M.D.   On: 04/13/2015 15:34   Ct Cervical Spine Wo Contrast  04/13/2015  CLINICAL DATA:  Unwitnessed fall in the bathroom this morning, landing face down on the floor. Swelling and bruising around both eyes and nose. Uncertain loss of consciousness. Initial encounter. EXAM: CT HEAD WITHOUT CONTRAST CT MAXILLOFACIAL WITHOUT CONTRAST CT CERVICAL SPINE WITHOUT CONTRAST TECHNIQUE: Multidetector CT imaging of the head, cervical spine, and maxillofacial structures were performed using the standard protocol without intravenous contrast. Multiplanar CT image reconstructions of the cervical spine and maxillofacial structures were also generated. COMPARISON:  Head CT 02/01/2015. Head, cervical, and maxillofacial CT 09/14/2013. FINDINGS: CT HEAD FINDINGS There is no evidence of acute cortical infarct, intracranial hemorrhage, mass, midline shift, or extra-axial fluid collection. Moderate cerebral atrophy is unchanged. Periventricular white-matter hypodensities are unchanged and nonspecific but compatible with mild-to-moderate chronic small vessel ischemic disease. Prior bilateral cataract extraction. Frontal scalp swelling/hematoma. Left maxillary sinus mucous retention cyst. Trace right mastoid effusion. No skull fracture. CT MAXILLOFACIAL FINDINGS There is soft tissue swelling/hematoma involving the frontal scalp soft tissues. No acute maxillofacial fracture is identified. Slight nasal bone deformity is chronic. Prior bilateral cataract extraction is noted. Small right sphenoid and moderate left maxillary sinus mucous retention cysts are present. There is a trace right mastoid effusion. CT CERVICAL SPINE  FINDINGS Vertebral alignment is unchanged, with trace anterolisthesis again seen of C4 on C5 and C7 on T1. Multilevel disc space narrowing does not appear significantly changed, severe at C5-6, C6-7, and T1-2. Mild, chronic T1 superior endplate compression fracture is unchanged. Facet ankylosis is noted on the right at C3-4 and bilaterally at C4-5. Prominent calcified pannus is again seen posterior to the dens with associated erosions of the dens as well as C1 lateral masses. Mild posterior subluxation of the right lateral mass with respect to C2 is unchanged. No acute cervical spine fracture is identified. Mild centrilobular emphysema is partially visualized in the lung apices. IMPRESSION: 1. No evidence of acute intracranial abnormality. 2. Moderate cerebral atrophy and mild-to-moderate chronic small vessel ischemic disease. 3. Frontal scalp hematoma.  No acute maxillofacial fracture. 4. No acute osseous abnormality in the cervical spine. Electronically Signed   By: Logan Bores M.D.   On: 04/13/2015 15:34   Dg Shoulder Left  04/13/2015  CLINICAL DATA:  Lateral LEFT shoulder pain, fell last night, initial encounter EXAM: LEFT SHOULDER - 2+ VIEW COMPARISON:  09/14/2013 FINDINGS: Osseous demineralization. AC joint alignment normal. Minimal acromial spur formation. No acute fracture, dislocation, or bone destruction. Visualized LEFT ribs intact. IMPRESSION: No acute osseous abnormalities. Electronically Signed   By: Lavonia Dana M.D.   On: 04/13/2015 15:40   Ct Maxillofacial Wo Cm  04/13/2015  CLINICAL DATA:  Unwitnessed fall in the bathroom this morning, landing face down on the floor. Swelling and bruising around both eyes and nose. Uncertain loss of consciousness. Initial encounter. EXAM: CT HEAD WITHOUT CONTRAST CT MAXILLOFACIAL WITHOUT CONTRAST CT CERVICAL SPINE WITHOUT CONTRAST TECHNIQUE: Multidetector CT imaging of the head, cervical spine, and maxillofacial structures were performed using the standard  protocol without intravenous contrast. Multiplanar CT image reconstructions of the cervical spine and maxillofacial structures were also generated.  COMPARISON:  Head CT 02/01/2015. Head, cervical, and maxillofacial CT 09/14/2013. FINDINGS: CT HEAD FINDINGS There is no evidence of acute cortical infarct, intracranial hemorrhage, mass, midline shift, or extra-axial fluid collection. Moderate cerebral atrophy is unchanged. Periventricular white-matter hypodensities are unchanged and nonspecific but compatible with mild-to-moderate chronic small vessel ischemic disease. Prior bilateral cataract extraction. Frontal scalp swelling/hematoma. Left maxillary sinus mucous retention cyst. Trace right mastoid effusion. No skull fracture. CT MAXILLOFACIAL FINDINGS There is soft tissue swelling/hematoma involving the frontal scalp soft tissues. No acute maxillofacial fracture is identified. Slight nasal bone deformity is chronic. Prior bilateral cataract extraction is noted. Small right sphenoid and moderate left maxillary sinus mucous retention cysts are present. There is a trace right mastoid effusion. CT CERVICAL SPINE FINDINGS Vertebral alignment is unchanged, with trace anterolisthesis again seen of C4 on C5 and C7 on T1. Multilevel disc space narrowing does not appear significantly changed, severe at C5-6, C6-7, and T1-2. Mild, chronic T1 superior endplate compression fracture is unchanged. Facet ankylosis is noted on the right at C3-4 and bilaterally at C4-5. Prominent calcified pannus is again seen posterior to the dens with associated erosions of the dens as well as C1 lateral masses. Mild posterior subluxation of the right lateral mass with respect to C2 is unchanged. No acute cervical spine fracture is identified. Mild centrilobular emphysema is partially visualized in the lung apices. IMPRESSION: 1. No evidence of acute intracranial abnormality. 2. Moderate cerebral atrophy and mild-to-moderate chronic small vessel  ischemic disease. 3. Frontal scalp hematoma.  No acute maxillofacial fracture. 4. No acute osseous abnormality in the cervical spine. Electronically Signed   By: Logan Bores M.D.   On: 04/13/2015 15:34    My personal review of EKG: Sinus rhythm, normal QTC   Assessment & Plan  Principal Problem:   Acute renal failure (ARF) (HCC) Active Problems:   Parkinson's disease (HCC)   Autonomic orthostatic hypotension   Traumatic hematoma of head   Hyperlipidemia   PROSTATE CANCER, HX OF   Hypertension   Constipation due to neurogenic bowel   Normocytic anemia   Hyponatremia   Rhabdomyolysis    Acute renal failure Patient dehydrated. Uncertain if this is due to fall v. anorexia v. Infection Patient received a liter bolus in the ER. Will place on 100 mL an hour 18 hours. Recheck bmet in a.m. Checking urine sodium, urine creatinine for FENA in order to determine if this was prerenal v. ATN UA is pending.  Rhabdomyolysis - mild Secondary to lying on the floor for 6 hours. CK 1015 on admit. Gently hydrate and recheck bmet and CK in a.m.  Multiple falls Patient has autonomic orthostatic hypotension secondary to Parkinson's. Continue Midodrine and fluorinef.  Allow permissive hypertension. We'll check TSH.  Hyponatremia We due to dehydration versus SIADH. Patient started Paxil last week.  Checking serum osmoles and urine osmoles Will give gentle IV hydration  Leukocytosis with elevated neutrophils Elevated white count likely partially due to fall, but need to rule out infection Lungs are clear to auscultation. Patient is without shortness of breath cough or fever. No diarrhea, vomiting, abdominal pain UA pending.    Normocytic anemia Acute drop from 12 to 10 with a drop in hematocrit as well. Patient is on a 325 mg aspirin daily. Check anemia panel and stool for guiac. Continue iron supplementation that was started outpatient.  Parkinson's Continue carbidopa  levodopa    Consultants Called:  None  Family Communication:   Daughter at bedside  Code Status:  Full code. Family may benefit from palliative medicine consult.  Condition:  Guarded  Potential Disposition: SNF versus his son's house with 24-hour supervision in approximately 3 days  Time spent in minutes : Kimball,  PA-C on 04/13/2015 at 5:07 PM Between 7am to 7pm - Pager - [phone] After 7pm go to [email or web address] - password [email or web address] And look for the night coverage person covering me after hours   I have examined the patient, reviewed the chart and agree with the above assessment and plan as outlined by Imogene Burn, PA.  Debbe Odea, MD

## 2015-04-13 NOTE — Care Management (Addendum)
ED CM consulted by Dr. Maryan Rued regarding family needing assistance with questions regarding long term care. ED CSW consulted, regarding consult. Reviewed chart, patient lives at home with 79 yo wife, patient has parkinson,presented today with fall.  ED CM and CSW met with patient and  Daughter Darius Castro at bedside. Daughter stated that patient is receiving [facility] services but unable to recall the name of agency at this time. Long term goal of care is to have patient move in with patient's son according to daughter but home wont be ready until late November. Patient is being admitted, CM will continue to follow for discharge planning.

## 2015-04-13 NOTE — ED Notes (Signed)
In and out cath attempted, resistance met, no further attempt made.  Floor RN aware.

## 2015-04-13 NOTE — Progress Notes (Signed)
Patient arrived on unit via stretcher from ED, family at bedside.

## 2015-04-13 NOTE — ED Provider Notes (Addendum)
CSN: [id number]     Arrival date & time 04/13/15  1259 History   First MD Initiated Contact with Patient 04/13/15 1333     Chief Complaint  Patient presents with  . Fall     (Consider location/radiation/quality/duration/timing/severity/associated sxs/prior Treatment) HPI Comments: Patient is an 81 year old male with a history of Parkinson's disease, frequent falls, intermittent dizziness, hyperlipidemia, diabetes coming in with his family today due to a fall that occurred last night. Patient cannot remember the details but family states he got up to go to the bathroom fell while in the bathroom and laid on the floor from 4 AM until noon today. Patient cannot recall if he lost consciousness or not. He does not remember the fall. Patient's daughter states that in the last 2 years he's had multiple falls including dizziness and syncope which is related to his Parkinson's disease. He has not had any recent illness but did start Paxil last week for depressive type symptoms. Today he denies any chest pain, shortness of breath, abdominal pain, nausea or vomiting. He is complaining of a headache and has a laceration to his for head. Also complaining of left shoulder pain. Family states he is intermittently confused and that's not unusual. No blood thinning medications   Patient is a 81 y.o. male presenting with fall. The history is provided by the patient and a relative.  Fall    Past Medical History  Diagnosis Date  . Hyperlipidemia     takes Lipitor daily  . Adenomatous colon polyp 3/09  . Personal history of prostate cancer   . Dysphagia     with pills  . Hypertension     takes Fosinopril daily  . Sleep apnea     sleep study done in 03/30/04 in epic;doesn't use a CPAP  . Pneumonia     hx of as a child  . Arthritis     back and hip  . Dizziness     pt states when he is hot and stands up too fast  . Back pain     hx of ruptured disc  . Dry skin   . Hemorrhoid     internal   . Hx of  colonic polyps   . Nocturia   . Prostate cancer ([place])   . Impaired hearing   . 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 Diabetes mellitus     patient denies Diabetes  . COLONIC POLYPS, HX OF 11/05/2008    Qualifier: Diagnosis of  By: Julaine Hua CMA (AAMA), Estill Bamberg     Past Surgical History  Procedure Laterality Date  . Lumbar laminectomy  1998  . Knee arthroscopy  2001  . Rotator cuff repair Right 2/10  . Cataract extraction Bilateral   . Tonsillectomy      as a child  . Colonoscopy    . Radioactive seed implant  2008  . Total hip arthroplasty  08/24/2011    Procedure: TOTAL HIP ARTHROPLASTY;  Surgeon: Lorn Junes, MD;  Location: [facility];  Service: Orthopedics;  Laterality: Right;  DR [place]  . Zenker's diverticulectomy  09/02/2011    Procedure: ZENKER'S DIVERTICULECTOMY;  Surgeon: Melida Quitter, MD;  Location: [facility] OR;  Service: ENT;  Laterality: N/A;   Family History  Problem Relation Age of Onset  . Heart disease Father   . Heart attack Father 58  . Colon polyps Daughter   . Colon cancer Neg Hx   . Anesthesia problems Neg Hx   .  Hypotension Neg Hx   . Malignant hyperthermia Neg Hx   . Pseudochol deficiency Neg Hx   . Hypertension Mother   . Cancer Sister    Social History  Substance Use Topics  . Smoking status: Former Smoker    Quit date: 06/22/1961  . Smokeless tobacco: Never Used  . Alcohol Use: Yes     Comment: red wine 3-4 times/wk    Review of Systems  All other systems reviewed and are negative.     Allergies  Review of patient's allergies indicates no known allergies.  Home Medications   Prior to Admission medications   Medication Sig Start Date End Date Taking? Authorizing Provider  aspirin 325 MG tablet Take 325 mg by mouth daily.   Yes Historical Provider, MD  carbidopa-levodopa (SINEMET IR) 25-100 MG per tablet Take 1.5 tablets by mouth 3 (three) times daily. 02/01/15  Yes Rebecca S Tat, DO  Coenzyme Q10 (CO  Q-10) 100 MG CAPS Take 100 mg by mouth 3 (three) times daily.   Yes Historical Provider, MD  ferrous sulfate 325 (65 FE) MG tablet Take 325 mg by mouth daily with breakfast.   Yes Historical Provider, MD  fludrocortisone (FLORINEF) 0.1 MG tablet TAKE 1 TABLET BY MOUTH EVERY DAY 03/05/15  Yes Dorothy Spark, MD  KLOR-CON M10 10 MEQ tablet TAKE 1 TABLET (10 MEQ TOTAL) BY MOUTH DAILY. 10/15/14  Yes Dorothy Spark, MD  midodrine (PROAMATINE) 10 MG tablet Take 1 tablet (10 mg total) by mouth 3 (three) times daily. 09/24/14  Yes Dorothy Spark, MD  Multiple Vitamins-Minerals (CENTRUM SILVER ULTRA WOMENS PO) Take 1 tablet by mouth daily.   Yes Historical Provider, MD  PARoxetine (PAXIL) 20 MG tablet Take 1 tablet (20 mg total) by mouth daily. 04/08/15  Yes Kennyth Arnold, FNP  polycarbophil (FIBERCON) 625 MG tablet Take 625 mg by mouth daily.   Yes Historical Provider, MD   BP 156/88 mmHg  Pulse 70  Temp(Src) 97.6 F (36.4 C) (Oral)  Resp 22  SpO2 100% Physical Exam  Constitutional: He is oriented to person, place, and time. He appears well-developed and well-nourished. No distress.  HENT:  Head: Normocephalic. Head is with laceration.    Mouth/Throat: Oropharynx is clear and moist.  Jagged laceration over the left eyebrow  Eyes: Conjunctivae and EOM are normal. Pupils are equal, round, and reactive to light.  Neck: Normal range of motion. Neck supple.  Cardiovascular: Normal rate, regular rhythm and intact distal pulses.   No murmur heard. Pulmonary/Chest: Effort normal and breath sounds normal. No respiratory distress. He has no wheezes. He has no rales.  Abdominal: Soft. He exhibits no distension. There is no tenderness. There is no rebound and no guarding.    Musculoskeletal: Normal range of motion. He exhibits tenderness. He exhibits no edema.       Left shoulder: He exhibits tenderness, bony tenderness and pain. He exhibits normal range of motion and no deformity.       Cervical  back: Normal.       Thoracic back: Normal.       Lumbar back: Normal.       Arms: Neurological: He is alert and oriented to person, place, and time.  Skin: Skin is warm and dry. No rash noted. No erythema.     Psychiatric: He has a normal mood and affect. His behavior is normal.  Nursing note and vitals reviewed.   ED Course  Procedures (including critical care time) Labs Review  Labs Reviewed  CBC WITH DIFFERENTIAL/PLATELET - Abnormal; Notable for the following:    WBC 12.5 (*)    RBC 3.26 (*)    Hemoglobin 10.2 (*)    HCT 29.7 (*)    Neutro Abs 11.1 (*)    All other components within normal limits  BASIC METABOLIC PANEL - Abnormal; Notable for the following:    Sodium 129 (*)    Chloride 91 (*)    Glucose, Bld 139 (*)    BUN 30 (*)    Creatinine, Ser 1.97 (*)    Calcium 8.6 (*)    GFR calc non Af Amer 28 (*)    GFR calc Af Amer 33 (*)    All other components within normal limits  CK - Abnormal; Notable for the following:    Total CK 1015 (*)    All other components within normal limits  URINALYSIS, ROUTINE W REFLEX MICROSCOPIC (NOT AT [facility])    Imaging Review Ct Head Wo Contrast  04/13/2015  CLINICAL DATA:  Unwitnessed fall in the bathroom this morning, landing face down on the floor. Swelling and bruising around both eyes and nose. Uncertain loss of consciousness. Initial encounter. EXAM: CT HEAD WITHOUT CONTRAST CT MAXILLOFACIAL WITHOUT CONTRAST CT CERVICAL SPINE WITHOUT CONTRAST TECHNIQUE: Multidetector CT imaging of the head, cervical spine, and maxillofacial structures were performed using the standard protocol without intravenous contrast. Multiplanar CT image reconstructions of the cervical spine and maxillofacial structures were also generated. COMPARISON:  Head CT 02/01/2015. Head, cervical, and maxillofacial CT 09/14/2013. FINDINGS: CT HEAD FINDINGS There is no evidence of acute cortical infarct, intracranial hemorrhage, mass, midline shift, or extra-axial fluid  collection. Moderate cerebral atrophy is unchanged. Periventricular white-matter hypodensities are unchanged and nonspecific but compatible with mild-to-moderate chronic small vessel ischemic disease. Prior bilateral cataract extraction. Frontal scalp swelling/hematoma. Left maxillary sinus mucous retention cyst. Trace right mastoid effusion. No skull fracture. CT MAXILLOFACIAL FINDINGS There is soft tissue swelling/hematoma involving the frontal scalp soft tissues. No acute maxillofacial fracture is identified. Slight nasal bone deformity is chronic. Prior bilateral cataract extraction is noted. Small right sphenoid and moderate left maxillary sinus mucous retention cysts are present. There is a trace right mastoid effusion. CT CERVICAL SPINE FINDINGS Vertebral alignment is unchanged, with trace anterolisthesis again seen of C4 on C5 and C7 on T1. Multilevel disc space narrowing does not appear significantly changed, severe at C5-6, C6-7, and T1-2. Mild, chronic T1 superior endplate compression fracture is unchanged. Facet ankylosis is noted on the right at C3-4 and bilaterally at C4-5. Prominent calcified pannus is again seen posterior to the dens with associated erosions of the dens as well as C1 lateral masses. Mild posterior subluxation of the right lateral mass with respect to C2 is unchanged. No acute cervical spine fracture is identified. Mild centrilobular emphysema is partially visualized in the lung apices. IMPRESSION: 1. No evidence of acute intracranial abnormality. 2. Moderate cerebral atrophy and mild-to-moderate chronic small vessel ischemic disease. 3. Frontal scalp hematoma.  No acute maxillofacial fracture. 4. No acute osseous abnormality in the cervical spine. Electronically Signed   By: Logan Bores M.D.   On: 04/13/2015 15:34   Ct Cervical Spine Wo Contrast  04/13/2015  CLINICAL DATA:  Unwitnessed fall in the bathroom this morning, landing face down on the floor. Swelling and bruising  around both eyes and nose. Uncertain loss of consciousness. Initial encounter. EXAM: CT HEAD WITHOUT CONTRAST CT MAXILLOFACIAL WITHOUT CONTRAST CT CERVICAL SPINE WITHOUT CONTRAST TECHNIQUE: Multidetector CT imaging of  the head, cervical spine, and maxillofacial structures were performed using the standard protocol without intravenous contrast. Multiplanar CT image reconstructions of the cervical spine and maxillofacial structures were also generated. COMPARISON:  Head CT 02/01/2015. Head, cervical, and maxillofacial CT 09/14/2013. FINDINGS: CT HEAD FINDINGS There is no evidence of acute cortical infarct, intracranial hemorrhage, mass, midline shift, or extra-axial fluid collection. Moderate cerebral atrophy is unchanged. Periventricular white-matter hypodensities are unchanged and nonspecific but compatible with mild-to-moderate chronic small vessel ischemic disease. Prior bilateral cataract extraction. Frontal scalp swelling/hematoma. Left maxillary sinus mucous retention cyst. Trace right mastoid effusion. No skull fracture. CT MAXILLOFACIAL FINDINGS There is soft tissue swelling/hematoma involving the frontal scalp soft tissues. No acute maxillofacial fracture is identified. Slight nasal bone deformity is chronic. Prior bilateral cataract extraction is noted. Small right sphenoid and moderate left maxillary sinus mucous retention cysts are present. There is a trace right mastoid effusion. CT CERVICAL SPINE FINDINGS Vertebral alignment is unchanged, with trace anterolisthesis again seen of C4 on C5 and C7 on T1. Multilevel disc space narrowing does not appear significantly changed, severe at C5-6, C6-7, and T1-2. Mild, chronic T1 superior endplate compression fracture is unchanged. Facet ankylosis is noted on the right at C3-4 and bilaterally at C4-5. Prominent calcified pannus is again seen posterior to the dens with associated erosions of the dens as well as C1 lateral masses. Mild posterior subluxation of the  right lateral mass with respect to C2 is unchanged. No acute cervical spine fracture is identified. Mild centrilobular emphysema is partially visualized in the lung apices. IMPRESSION: 1. No evidence of acute intracranial abnormality. 2. Moderate cerebral atrophy and mild-to-moderate chronic small vessel ischemic disease. 3. Frontal scalp hematoma.  No acute maxillofacial fracture. 4. No acute osseous abnormality in the cervical spine. Electronically Signed   By: Logan Bores M.D.   On: 04/13/2015 15:34   Dg Shoulder Left  04/13/2015  CLINICAL DATA:  Lateral LEFT shoulder pain, fell last night, initial encounter EXAM: LEFT SHOULDER - 2+ VIEW COMPARISON:  09/14/2013 FINDINGS: Osseous demineralization. AC joint alignment normal. Minimal acromial spur formation. No acute fracture, dislocation, or bone destruction. Visualized LEFT ribs intact. IMPRESSION: No acute osseous abnormalities. Electronically Signed   By: Lavonia Dana M.D.   On: 04/13/2015 15:40   Ct Maxillofacial Wo Cm  04/13/2015  CLINICAL DATA:  Unwitnessed fall in the bathroom this morning, landing face down on the floor. Swelling and bruising around both eyes and nose. Uncertain loss of consciousness. Initial encounter. EXAM: CT HEAD WITHOUT CONTRAST CT MAXILLOFACIAL WITHOUT CONTRAST CT CERVICAL SPINE WITHOUT CONTRAST TECHNIQUE: Multidetector CT imaging of the head, cervical spine, and maxillofacial structures were performed using the standard protocol without intravenous contrast. Multiplanar CT image reconstructions of the cervical spine and maxillofacial structures were also generated. COMPARISON:  Head CT 02/01/2015. Head, cervical, and maxillofacial CT 09/14/2013. FINDINGS: CT HEAD FINDINGS There is no evidence of acute cortical infarct, intracranial hemorrhage, mass, midline shift, or extra-axial fluid collection. Moderate cerebral atrophy is unchanged. Periventricular white-matter hypodensities are unchanged and nonspecific but compatible  with mild-to-moderate chronic small vessel ischemic disease. Prior bilateral cataract extraction. Frontal scalp swelling/hematoma. Left maxillary sinus mucous retention cyst. Trace right mastoid effusion. No skull fracture. CT MAXILLOFACIAL FINDINGS There is soft tissue swelling/hematoma involving the frontal scalp soft tissues. No acute maxillofacial fracture is identified. Slight nasal bone deformity is chronic. Prior bilateral cataract extraction is noted. Small right sphenoid and moderate left maxillary sinus mucous retention cysts are present. There is a trace right  mastoid effusion. CT CERVICAL SPINE FINDINGS Vertebral alignment is unchanged, with trace anterolisthesis again seen of C4 on C5 and C7 on T1. Multilevel disc space narrowing does not appear significantly changed, severe at C5-6, C6-7, and T1-2. Mild, chronic T1 superior endplate compression fracture is unchanged. Facet ankylosis is noted on the right at C3-4 and bilaterally at C4-5. Prominent calcified pannus is again seen posterior to the dens with associated erosions of the dens as well as C1 lateral masses. Mild posterior subluxation of the right lateral mass with respect to C2 is unchanged. No acute cervical spine fracture is identified. Mild centrilobular emphysema is partially visualized in the lung apices. IMPRESSION: 1. No evidence of acute intracranial abnormality. 2. Moderate cerebral atrophy and mild-to-moderate chronic small vessel ischemic disease. 3. Frontal scalp hematoma.  No acute maxillofacial fracture. 4. No acute osseous abnormality in the cervical spine. Electronically Signed   By: Logan Bores M.D.   On: 04/13/2015 15:34   I have personally reviewed and evaluated these images and lab results as part of my medical decision-making.   EKG Interpretation   Date/Time:  Saturday April 13 2015 13:12:49 EDT Ventricular Rate:  73 PR Interval:  205 QRS Duration: 88 QT Interval:  530 QTC Calculation: 584 R Axis:   75 Text  Interpretation:  Sinus rhythm new Nonspecific T abnormalities,  diffuse leads Prolonged QT interval Artifact Confirmed by Maryan Rued  MD,  Loree Fee ([id number]) on 04/13/2015 3:14:40 PM      MDM   Final diagnoses:  Traumatic rhabdomyolysis, initial encounter ([place])  Acute renal failure, unspecified acute renal failure type ([facility])  Dehydration  Forehead laceration, initial encounter    Patient is an 81 year old male with a history of Parkinson's disease, frequent falls, intermittent dizziness, hyperlipidemia, diabetes who presents today after a fall in the bathroom early this morning. He was laying on the floor for approximately 8-10 hours before family arrived and more able to help him up. He has had ongoing issues with mobility, dizziness and syncope for the last 2 years. Family feels that he is at his baseline mental status. Laceration to the for head with ecchymosis around the eyes. Normal visual exam normal neurologic exam. No abdominal or chest tenderness. Pain with left shoulder palpitation but able to range both hips without reproduction of pain or tenderness. Low suspicion for hip fracture at this time.  Patient takes no anticoagulation medications and has no cervical neck tenderness however given the fall and injury to the front of his head will do a CT of the head, neck and maxillofacial. Tetanus shot is up-to-date. Wound repaired. CBC, BMP, CK, UA, EKG pending  3:15 PM Labs are consistent with a new acute renal failure as well as rhabdomyolysis with a CK of greater than 1000. Creatinine has increased to 1.97 from 1. No evidence of urinary retention. Imaging without acute findings. Patient given IV fluids. Will admit for further care  Blanchie Dessert, MD 04/13/15 [place], MD 04/13/15 [phone]

## 2015-04-13 NOTE — ED Notes (Signed)
MD at bedside stitching pt.

## 2015-04-14 DIAGNOSIS — N170 Acute kidney failure with tubular necrosis: Secondary | ICD-10-CM

## 2015-04-14 LAB — BASIC METABOLIC PANEL
Anion gap: 7 (ref 5–15)
BUN: 35 mg/dL — ABNORMAL HIGH (ref 6–20)
CHLORIDE: 95 mmol/L — AB (ref 101–111)
CO2: 28 mmol/L (ref 22–32)
Calcium: 8.3 mg/dL — ABNORMAL LOW (ref 8.9–10.3)
Creatinine, Ser: 1.57 mg/dL — ABNORMAL HIGH (ref 0.61–1.24)
GFR calc non Af Amer: 37 mL/min — ABNORMAL LOW (ref >60)
GFR, EST AFRICAN AMERICAN: 43 mL/min — AB (ref >60)
Glucose, Bld: 110 mg/dL — ABNORMAL HIGH (ref 65–99)
POTASSIUM: 3.4 mmol/L — AB (ref 3.5–5.1)
SODIUM: 130 mmol/L — AB (ref 135–145)

## 2015-04-14 LAB — CBC
HEMATOCRIT: 24.6 % — AB (ref 39.0–52.0)
HEMOGLOBIN: 8.4 g/dL — AB (ref 13.0–17.0)
MCH: 31 pg (ref 26.0–34.0)
MCHC: 34.1 g/dL (ref 30.0–36.0)
MCV: 90.8 fL (ref 78.0–100.0)
Platelets: 220 10*3/uL (ref 150–400)
RBC: 2.71 MIL/uL — AB (ref 4.22–5.81)
RDW: 12.8 % (ref 11.5–15.5)
WBC: 9.5 10*3/uL (ref 4.0–10.5)

## 2015-04-14 LAB — URINALYSIS, ROUTINE W REFLEX MICROSCOPIC
Bilirubin Urine: NEGATIVE
GLUCOSE, UA: NEGATIVE mg/dL
HGB URINE DIPSTICK: NEGATIVE
Ketones, ur: NEGATIVE mg/dL
Leukocytes, UA: NEGATIVE
NITRITE: NEGATIVE
PH: 5.5 (ref 5.0–8.0)
PROTEIN: NEGATIVE mg/dL
SPECIFIC GRAVITY, URINE: 1.015 (ref 1.005–1.030)
UROBILINOGEN UA: 0.2 mg/dL (ref 0.0–1.0)

## 2015-04-14 LAB — OSMOLALITY: Osmolality: 281 mOsm/kg (ref 275–300)

## 2015-04-14 LAB — SODIUM, URINE, RANDOM: Sodium, Ur: <10 mmol/L

## 2015-04-14 LAB — CREATININE, URINE, RANDOM: Creatinine, Urine: 102.4 mg/dL

## 2015-04-14 LAB — CK: Total CK: 1115 U/L — ABNORMAL HIGH (ref 49–397)

## 2015-04-14 LAB — OSMOLALITY, URINE: OSMOLALITY UR: 422 mosm/kg (ref 390–1090)

## 2015-04-14 MED ORDER — SODIUM CHLORIDE 0.9 % IV SOLN: INTRAVENOUS | Status: DC

## 2015-04-14 MED ORDER — POTASSIUM CHLORIDE CRYS ER 20 MEQ PO TBCR
40.0000 meq | EXTENDED_RELEASE_TABLET | Freq: Every day | ORAL | Status: DC
  Administered 2015-04-15 – 2015-04-16 (×2): 40 meq via ORAL
  Filled 2015-04-14 (×3): qty 2

## 2015-04-14 NOTE — Progress Notes (Signed)
Utilization review completed.  

## 2015-04-14 NOTE — Progress Notes (Signed)
Patient Demographics:    Darius Castro, is a 81 y.o. male, DOB - [birth year], AST:[id number]  Admit date - 04/13/2015   Admitting Physician Debbe Odea, MD  Outpatient Primary MD for the patient is Garret Reddish, MD  LOS - 1   Chief Complaint  Patient presents with  . Fall        Subjective:    Darius Castro today has, No headache, No chest pain, No abdominal pain - No Nausea, No new weakness tingling or numbness, No Cough - SOB.     Assessment  & Plan :    Principal Problem:   Acute renal failure (ARF) (HCC) Active Problems:   Hyperlipidemia   PROSTATE CANCER, HX OF   Hypertension   Constipation due to neurogenic bowel   Parkinson's disease (HCC)   Autonomic orthostatic hypotension   Traumatic hematoma of head   Normocytic anemia   Hyponatremia   Rhabdomyolysis   1. Fall with loss of consciousness, extensive facial injury and bruising. Denies any seizure-like episode, no bowel or bladder incontinence, does have history of autonomic dysfunction from underlying Parkinson's disease. This could have been the cause of his fall however he claims that he lost consciousness for at least 10-15 minutes. We will monitor on telemetry, check echo to rule out structural heart disease, increase activity monitor orthostatics.  2. Parkinson's with autonomic dysfunction. Continue carbidopa/levodopa, continue midodrine and Florinef supplementation. Monitor orthostatics. PT eval, may require placement.   3. Mild rhabdomyolysis due to fall along with ARF. Hydrate. Improving. Check UA.   4. Reactionary leukocytosis due to fall and facial injury with bruising. Resolved.   5. Anemia, ferritin stable, stable reticulocyte count. Outpatient workup.   6. Hyponatremia. Could be due to dehydration. Improving with  IV fluids continue.   Code Status : Full  Family Communication  : None present  Disposition Plan  : To be decided  Consults  :  None  Procedures  : CT head and C-spine nonacute. X-ray left shoulder not acute  DVT Prophylaxis  :  SCD  Lab Results  Component Value Date   PLT 220 04/14/2015    Inpatient Medications  Scheduled Meds: . aspirin  325 mg Oral Daily  . bacitracin   Topical BID  . carbidopa-levodopa  1.5 tablet Oral TID  . docusate sodium  100 mg Oral BID  . ferrous sulfate  325 mg Oral Q breakfast  . fludrocortisone  100 mcg Oral Daily  . Influenza vac split quadrivalent PF  0.5 mL Intramuscular Tomorrow-1000  . midodrine  10 mg Oral TID WC  . PARoxetine  20 mg Oral Daily  . polycarbophil  625 mg Oral Daily  . potassium chloride  10 mEq Oral Daily   Continuous Infusions: . sodium chloride 100 mL/hr at 04/13/15 1846   PRN Meds:.acetaminophen **OR** acetaminophen, alum & mag hydroxide-simeth, ondansetron **OR** ondansetron (ZOFRAN) IV, oxyCODONE, polyethylene glycol  Antibiotics  :    Anti-infectives    None        Objective:   Filed Vitals:   04/13/15 2123 04/14/15 0100 04/14/15 0530 04/14/15 0953  BP: 134/47  145/73 124/52  Pulse: 59  65 65  Temp: 99.4 F (37.4 C)  98.4 F (36.9 C) 98 F (36.7 C)  TempSrc: Oral  Oral Oral  Resp: 19 18 18 17   Height:      Weight: 64.4 kg (141 lb 15.6 oz)     SpO2: 96%  98% 100%    Wt Readings from Last 3 Encounters:  04/13/15 64.4 kg (141 lb 15.6 oz)  09/24/14 72.122 kg (159 lb)  06/29/14 69.854 kg (154 lb)     Intake/Output Summary (Last 24 hours) at 04/14/15 1106 Last data filed at 04/14/15 1044  Gross per 24 hour  Intake      0 ml  Output    200 ml  Net   -200 ml     Physical Exam  Awake Alert, Oriented X 1, No new F.N deficits, Normal affect [place].extensive stable facial bruising from fall,PERRAL Supple Neck,No JVD, No cervical lymphadenopathy appriciated.  Symmetrical Chest wall movement,  Good air movement bilaterally, CTAB RRR,No Gallops,Rubs or new Murmurs, No Parasternal Heave +ve B.Sounds, Abd Soft, No tenderness, No organomegaly appriciated, No rebound - guarding or rigidity. No Cyanosis, Clubbing or edema, No new Rash      Data Review:   Micro Results No results found for this or any previous visit (from the past 240 hour(s)).  Radiology Reports Ct Head Wo Contrast  04/13/2015  CLINICAL DATA:  Unwitnessed fall in the bathroom this morning, landing face down on the floor. Swelling and bruising around both eyes and nose. Uncertain loss of consciousness. Initial encounter. EXAM: CT HEAD WITHOUT CONTRAST CT MAXILLOFACIAL WITHOUT CONTRAST CT CERVICAL SPINE WITHOUT CONTRAST TECHNIQUE: Multidetector CT imaging of the head, cervical spine, and maxillofacial structures were performed using the standard protocol without intravenous contrast. Multiplanar CT image reconstructions of the cervical spine and maxillofacial structures were also generated. COMPARISON:  Head CT 02/01/2015. Head, cervical, and maxillofacial CT 09/14/2013. FINDINGS: CT HEAD FINDINGS There is no evidence of acute cortical infarct, intracranial hemorrhage, mass, midline shift, or extra-axial fluid collection. Moderate cerebral atrophy is unchanged. Periventricular white-matter hypodensities are unchanged and nonspecific but compatible with mild-to-moderate chronic small vessel ischemic disease. Prior bilateral cataract extraction. Frontal scalp swelling/hematoma. Left maxillary sinus mucous retention cyst. Trace right mastoid effusion. No skull fracture. CT MAXILLOFACIAL FINDINGS There is soft tissue swelling/hematoma involving the frontal scalp soft tissues. No acute maxillofacial fracture is identified. Slight nasal bone deformity is chronic. Prior bilateral cataract extraction is noted. Small right sphenoid and moderate left maxillary sinus mucous retention cysts are present. There is a trace right mastoid effusion.  CT CERVICAL SPINE FINDINGS Vertebral alignment is unchanged, with trace anterolisthesis again seen of C4 on C5 and C7 on T1. Multilevel disc space narrowing does not appear significantly changed, severe at C5-6, C6-7, and T1-2. Mild, chronic T1 superior endplate compression fracture is unchanged. Facet ankylosis is noted on the right at C3-4 and bilaterally at C4-5. Prominent calcified pannus is again seen posterior to the dens with associated erosions of the dens as well as C1 lateral masses. Mild posterior subluxation of the right lateral mass with respect to C2 is unchanged. No acute cervical spine fracture is identified. Mild centrilobular emphysema is partially visualized in the lung apices. IMPRESSION: 1. No evidence of acute intracranial abnormality. 2. Moderate cerebral atrophy and mild-to-moderate chronic small vessel ischemic disease. 3. Frontal scalp hematoma.  No acute maxillofacial fracture. 4. No acute osseous abnormality in the cervical spine. Electronically Signed   By: Logan Bores M.D.   On: 04/13/2015 15:34   Ct Cervical Spine Wo Contrast  04/13/2015  CLINICAL DATA:  Unwitnessed fall in the  bathroom this morning, landing face down on the floor. Swelling and bruising around both eyes and nose. Uncertain loss of consciousness. Initial encounter. EXAM: CT HEAD WITHOUT CONTRAST CT MAXILLOFACIAL WITHOUT CONTRAST CT CERVICAL SPINE WITHOUT CONTRAST TECHNIQUE: Multidetector CT imaging of the head, cervical spine, and maxillofacial structures were performed using the standard protocol without intravenous contrast. Multiplanar CT image reconstructions of the cervical spine and maxillofacial structures were also generated. COMPARISON:  Head CT 02/01/2015. Head, cervical, and maxillofacial CT 09/14/2013. FINDINGS: CT HEAD FINDINGS There is no evidence of acute cortical infarct, intracranial hemorrhage, mass, midline shift, or extra-axial fluid collection. Moderate cerebral atrophy is unchanged.  Periventricular white-matter hypodensities are unchanged and nonspecific but compatible with mild-to-moderate chronic small vessel ischemic disease. Prior bilateral cataract extraction. Frontal scalp swelling/hematoma. Left maxillary sinus mucous retention cyst. Trace right mastoid effusion. No skull fracture. CT MAXILLOFACIAL FINDINGS There is soft tissue swelling/hematoma involving the frontal scalp soft tissues. No acute maxillofacial fracture is identified. Slight nasal bone deformity is chronic. Prior bilateral cataract extraction is noted. Small right sphenoid and moderate left maxillary sinus mucous retention cysts are present. There is a trace right mastoid effusion. CT CERVICAL SPINE FINDINGS Vertebral alignment is unchanged, with trace anterolisthesis again seen of C4 on C5 and C7 on T1. Multilevel disc space narrowing does not appear significantly changed, severe at C5-6, C6-7, and T1-2. Mild, chronic T1 superior endplate compression fracture is unchanged. Facet ankylosis is noted on the right at C3-4 and bilaterally at C4-5. Prominent calcified pannus is again seen posterior to the dens with associated erosions of the dens as well as C1 lateral masses. Mild posterior subluxation of the right lateral mass with respect to C2 is unchanged. No acute cervical spine fracture is identified. Mild centrilobular emphysema is partially visualized in the lung apices. IMPRESSION: 1. No evidence of acute intracranial abnormality. 2. Moderate cerebral atrophy and mild-to-moderate chronic small vessel ischemic disease. 3. Frontal scalp hematoma.  No acute maxillofacial fracture. 4. No acute osseous abnormality in the cervical spine. Electronically Signed   By: Logan Bores M.D.   On: 04/13/2015 15:34   Dg Shoulder Left  04/13/2015  CLINICAL DATA:  Lateral LEFT shoulder pain, fell last night, initial encounter EXAM: LEFT SHOULDER - 2+ VIEW COMPARISON:  09/14/2013 FINDINGS: Osseous demineralization. AC joint alignment  normal. Minimal acromial spur formation. No acute fracture, dislocation, or bone destruction. Visualized LEFT ribs intact. IMPRESSION: No acute osseous abnormalities. Electronically Signed   By: Lavonia Dana M.D.   On: 04/13/2015 15:40   Ct Maxillofacial Wo Cm  04/13/2015  CLINICAL DATA:  Unwitnessed fall in the bathroom this morning, landing face down on the floor. Swelling and bruising around both eyes and nose. Uncertain loss of consciousness. Initial encounter. EXAM: CT HEAD WITHOUT CONTRAST CT MAXILLOFACIAL WITHOUT CONTRAST CT CERVICAL SPINE WITHOUT CONTRAST TECHNIQUE: Multidetector CT imaging of the head, cervical spine, and maxillofacial structures were performed using the standard protocol without intravenous contrast. Multiplanar CT image reconstructions of the cervical spine and maxillofacial structures were also generated. COMPARISON:  Head CT 02/01/2015. Head, cervical, and maxillofacial CT 09/14/2013. FINDINGS: CT HEAD FINDINGS There is no evidence of acute cortical infarct, intracranial hemorrhage, mass, midline shift, or extra-axial fluid collection. Moderate cerebral atrophy is unchanged. Periventricular white-matter hypodensities are unchanged and nonspecific but compatible with mild-to-moderate chronic small vessel ischemic disease. Prior bilateral cataract extraction. Frontal scalp swelling/hematoma. Left maxillary sinus mucous retention cyst. Trace right mastoid effusion. No skull fracture. CT MAXILLOFACIAL FINDINGS There is soft tissue swelling/hematoma  involving the frontal scalp soft tissues. No acute maxillofacial fracture is identified. Slight nasal bone deformity is chronic. Prior bilateral cataract extraction is noted. Small right sphenoid and moderate left maxillary sinus mucous retention cysts are present. There is a trace right mastoid effusion. CT CERVICAL SPINE FINDINGS Vertebral alignment is unchanged, with trace anterolisthesis again seen of C4 on C5 and C7 on T1. Multilevel disc  space narrowing does not appear significantly changed, severe at C5-6, C6-7, and T1-2. Mild, chronic T1 superior endplate compression fracture is unchanged. Facet ankylosis is noted on the right at C3-4 and bilaterally at C4-5. Prominent calcified pannus is again seen posterior to the dens with associated erosions of the dens as well as C1 lateral masses. Mild posterior subluxation of the right lateral mass with respect to C2 is unchanged. No acute cervical spine fracture is identified. Mild centrilobular emphysema is partially visualized in the lung apices. IMPRESSION: 1. No evidence of acute intracranial abnormality. 2. Moderate cerebral atrophy and mild-to-moderate chronic small vessel ischemic disease. 3. Frontal scalp hematoma.  No acute maxillofacial fracture. 4. No acute osseous abnormality in the cervical spine. Electronically Signed   By: Logan Bores M.D.   On: 04/13/2015 15:34     CBC  Recent Labs Lab 04/08/15 1123 04/13/15 1429 04/14/15 0331  WBC 7.7 12.5* 9.5  HGB 12.7* 10.2* 8.4*  HCT 38.0* 29.7* 24.6*  PLT 281.0 221 220  MCV 94.2 91.1 90.8  MCH  --  31.3 31.0  MCHC 33.3 34.3 34.1  RDW 13.4 12.6 12.8  LYMPHSABS 1.1 0.7  --   MONOABS 0.7 0.7  --   EOSABS 0.1 0.0  --   BASOSABS 0.0 0.0  --     Chemistries   Recent Labs Lab 04/08/15 1123 04/13/15 1429 04/14/15 0331  NA 140 129* 130*  K 4.0 4.0 3.4*  CL 100 91* 95*  CO2 30 25 28   GLUCOSE 104* 139* 110*  BUN 20 30* 35*  CREATININE 1.08 1.97* 1.57*  CALCIUM 9.2 8.6* 8.3*   ------------------------------------------------------------------------------------------------------------------ estimated creatinine clearance is 29.1 mL/min (by C-G formula based on Cr of 1.57). ------------------------------------------------------------------------------------------------------------------ No results for input(s): HGBA1C in the last 72  hours. ------------------------------------------------------------------------------------------------------------------ No results for input(s): CHOL, HDL, LDLCALC, TRIG, CHOLHDL, LDLDIRECT in the last 72 hours. ------------------------------------------------------------------------------------------------------------------  Recent Labs  04/13/15 1936  TSH 1.846   ------------------------------------------------------------------------------------------------------------------  Recent Labs  04/13/15 1936  VITAMINB12 705  FOLATE 38.4  FERRITIN 151  TIBC 308  IRON 34*  RETICCTPCT 1.6    Coagulation profile No results for input(s): INR, PROTIME in the last 168 hours.  No results for input(s): DDIMER in the last 72 hours.  Cardiac Enzymes No results for input(s): CKMB, TROPONINI, MYOGLOBIN in the last 168 hours.  Invalid input(s): CK ------------------------------------------------------------------------------------------------------------------ Invalid input(s): POCBNP   Time Spent in minutes  35   SINGH,PRASHANT K M.D on 04/14/2015 at 11:06 AM  Between 7am to 7pm - Pager - [phone]  After 7pm go to [email or web address] - password [facility]  [facility] -  Office  [phone]

## 2015-04-15 ENCOUNTER — Ambulatory Visit (HOSPITAL_COMMUNITY): Payer: Medicare Other

## 2015-04-15 DIAGNOSIS — G20 Parkinson's disease: Secondary | ICD-10-CM

## 2015-04-15 DIAGNOSIS — I509 Heart failure, unspecified: Secondary | ICD-10-CM

## 2015-04-15 DIAGNOSIS — I10 Essential (primary) hypertension: Secondary | ICD-10-CM

## 2015-04-15 DIAGNOSIS — T796XXA Traumatic ischemia of muscle, initial encounter: Secondary | ICD-10-CM | POA: Diagnosis not present

## 2015-04-15 DIAGNOSIS — M6282 Rhabdomyolysis: Secondary | ICD-10-CM

## 2015-04-15 DIAGNOSIS — S0093XD Contusion of unspecified part of head, subsequent encounter: Secondary | ICD-10-CM

## 2015-04-15 DIAGNOSIS — E785 Hyperlipidemia, unspecified: Secondary | ICD-10-CM

## 2015-04-15 DIAGNOSIS — I951 Orthostatic hypotension: Secondary | ICD-10-CM

## 2015-04-15 LAB — BASIC METABOLIC PANEL
ANION GAP: 9 (ref 5–15)
BUN: 23 mg/dL — AB (ref 6–20)
CHLORIDE: 101 mmol/L (ref 101–111)
CO2: 26 mmol/L (ref 22–32)
CREATININE: 0.96 mg/dL (ref 0.61–1.24)
Calcium: 8.1 mg/dL — ABNORMAL LOW (ref 8.9–10.3)
GFR calc non Af Amer: >60 mL/min (ref >60)
GLUCOSE: 103 mg/dL — AB (ref 65–99)
POTASSIUM: 3.6 mmol/L (ref 3.5–5.1)
SODIUM: 136 mmol/L (ref 135–145)

## 2015-04-15 LAB — URINE CULTURE

## 2015-04-15 MED ORDER — HALOPERIDOL LACTATE 5 MG/ML IJ SOLN
2.0000 mg | Freq: Four times a day (QID) | INTRAMUSCULAR | Status: DC | PRN
  Administered 2015-04-15 – 2015-04-16 (×2): 2 mg via INTRAVENOUS
  Filled 2015-04-15 (×2): qty 1

## 2015-04-15 MED ORDER — FLUDROCORTISONE ACETATE 0.1 MG PO TABS
100.0000 ug | ORAL_TABLET | Freq: Two times a day (BID) | ORAL | Status: DC
  Filled 2015-04-15: qty 1

## 2015-04-15 MED ORDER — HYDRALAZINE HCL 20 MG/ML IJ SOLN: 10.0000 mg | Freq: Four times a day (QID) | INTRAMUSCULAR | Status: DC | PRN

## 2015-04-15 MED ORDER — HYDRALAZINE HCL 20 MG/ML IJ SOLN
10.0000 mg | Freq: Once | INTRAMUSCULAR | Status: DC
  Filled 2015-04-15: qty 1

## 2015-04-15 MED ORDER — SODIUM CHLORIDE 0.9 % IV SOLN: INTRAVENOUS | Status: DC

## 2015-04-15 MED ORDER — FLUDROCORTISONE ACETATE 0.1 MG PO TABS
100.0000 ug | ORAL_TABLET | Freq: Every day | ORAL | Status: DC
  Administered 2015-04-15 – 2015-04-16 (×2): 100 ug via ORAL
  Filled 2015-04-15 (×2): qty 1

## 2015-04-15 NOTE — Evaluation (Signed)
Occupational Therapy Evaluation Patient Details Name: CARMINO OCAIN MRN: [id number] DOB: March 18, 1924 Today's Date: 04/15/2015    History of Present Illness  81 y.o. Male, admitted due to acute renal failure and falling while at home. PMH includes Parkinson's , autonomic orthostatic hypotension, hyperlipidemia, Adenomatous colon polyp, personal hx of prostate cancer, dysphagia and HTN.   Clinical Impression  Pt admitted to hospital due to reason stated above. Pt currently with functional limitiations due to the deficits listed below (see OT problem list). Prior to admission pt was receiving minimal assistance for ADLs. Pt currently requires minimal to moderate assistance for safety with ADLs. Pt limited to bed level ADLs due to increase in BP of 202/77 while in supine position. RN, notified regarding HTN. Pt will benefit from skilled OT to increase his independence and safety with ADLs and balance to allow discharge to venue listed below.  Follow Up Recommendations  SNF    Equipment Recommendations  3 in 1 bedside comode    Recommendations for Other Services       Precautions / Restrictions Precautions Precautions: Fall      Mobility Bed Mobility               General bed mobility comments: Unable to have pt sit EOB due to increase in BP of 202/77 while in supine position  Transfers                 General transfer comment: Unable to have pt perform transfer due to increase in BP of 202/77    Balance                                            ADL Overall ADL's : Needs assistance/impaired Eating/Feeding: Independent;Sitting   Grooming: Wash/dry face;Set up;Bed level   Upper Body Bathing: Minimal assitance;Sitting   Lower Body Bathing: Moderate assistance;Sitting/lateral leans;Sit to/from stand   Upper Body Dressing : Minimal assistance;Sitting   Lower Body Dressing: Moderate assistance;Sitting/lateral leans;Sit to/from stand                  General ADL Comments: Pt limited to bed level ADLs due to increase in BP of 202/77     Vision     Perception     Praxis      Pertinent Vitals/Pain Pain Assessment: No/denies pain     Hand Dominance Right   Extremity/Trunk Assessment Upper Extremity Assessment Upper Extremity Assessment: Overall WFL for tasks assessed   Lower Extremity Assessment Lower Extremity Assessment: Defer to PT evaluation       Communication Communication Communication: No difficulties;HOH (has hearing aid but does not wear them)   Cognition Arousal/Alertness: Awake/alert Behavior During Therapy: WFL for tasks assessed/performed Overall Cognitive Status: History of cognitive impairments - at baseline Area of Impairment: Orientation Orientation Level: Disoriented to;Time             General Comments: Pt stated today as "Thursday"   General Comments    Monitored Orthostatics prior to activity, increase in BP of 202/77 limiting session.    Exercises       Shoulder Instructions      Home Living Family/patient expects to be discharged to:: Private residence Living Arrangements: Spouse/significant other Available Help at Discharge: Family;Available 24 hours/day Type of Home: House Home Access: Level entry     Home Layout: One level     Bathroom  Shower/Tub: Occupational psychologist: Standard Bathroom Accessibility: Yes   Home Equipment: Cane - single point;Wheelchair - manual;Grab bars - toilet;Grab bars - tub/shower   Additional Comments: Daughter lives 2 miles away and comes and checks on pt frequently      Prior Functioning/Environment Level of Independence: Needs assistance    ADL's / Homemaking Assistance Needed: Per pt PTA he was recieving assistance for lower body dressing from daughter and daughter's husband.        OT Diagnosis: Generalized weakness;Cognitive deficits;Acute pain   OT Problem List: Decreased activity tolerance;Impaired balance  (sitting and/or standing);Decreased cognition;Decreased coordination;Decreased safety awareness;Decreased knowledge of use of DME or AE;Pain   OT Treatment/Interventions: Self-care/ADL training;Therapeutic exercise;DME and/or AE instruction;Therapeutic activities;Cognitive remediation/compensation;Patient/family education;Balance training    OT Goals(Current goals can be found in the care plan section) Acute Rehab OT Goals Patient Stated Goal: return home with wife OT Goal Formulation: With patient Time For Goal Achievement: 04/28/16 Potential to Achieve Goals: Fair ADL Goals Pt Will Perform Grooming: with min guard assist;sitting;standing (at sink) Pt Will Perform Upper Body Bathing: with supervision;with adaptive equipment;sitting Pt Will Perform Lower Body Bathing: with min assist;with adaptive equipment;sitting/lateral leans;sit to/from stand Pt Will Perform Upper Body Dressing: with supervision;sitting Pt Will Perform Lower Body Dressing: with min assist;with adaptive equipment;sitting/lateral leans;sit to/from stand Pt Will Transfer to Toilet: with min assist;stand pivot transfer;bedside commode Pt Will Perform Toileting - Clothing Manipulation and hygiene: with min assist;sitting/lateral leans;sit to/from stand Pt Will Perform Tub/Shower Transfer: Shower transfer;with min assist;ambulating;rolling walker;3 in 1  OT Frequency: Min 2X/week   Barriers to D/C:            Co-evaluation              End of Session Nurse Communication: Other (comment) (Orthostatics)  Activity Tolerance: Other (comment) (Increase in BP) Patient left: in bed;with call bell/phone within reach;with bed alarm set   Time: 3491-7915 OT Time Calculation (min): 35 min Charges:  OT General Charges $OT Visit: 1 Procedure OT Evaluation $Initial OT Evaluation Tier I: 1 Procedure OT Treatments $Self Care/Home Management : 8-22 mins G-Codes:    Lin Landsman 2015/04/23, 1:59 PM

## 2015-04-15 NOTE — Progress Notes (Signed)
  Echocardiogram 2D Echocardiogram has been performed.  Diamond Nickel 04/15/2015, 2:32 PM

## 2015-04-15 NOTE — Progress Notes (Signed)
Patient Demographics:    Rodell Marrs, is a 81 y.o. male, DOB - [birth year], YKZ:[id number]  Admit date - 04/13/2015   Admitting Physician Debbe Odea, MD  Outpatient Primary MD for the patient is Garret Reddish, MD  LOS - 2   Chief Complaint  Patient presents with  . Fall        Subjective:    Wilfredo Canterbury today has, No headache, No chest pain, No abdominal pain - No Nausea, No new weakness tingling or numbness, No Cough - SOB.     Assessment  & Plan :     1. Fall with loss of consciousness, extensive facial injury and bruising. Denies any seizure-like episode, no bowel or bladder incontinence, does have history of autonomic dysfunction from underlying Parkinson's disease. This could have been the cause of his fall however he claims that he lost consciousness for at least 10-15 minutes. We will monitor on telemetry, check echo to rule out structural heart disease, he is orthostatic likely due to underlying Parkinson's with autonomic dysfunction. Continue hydration continue midodrine and Florinef. TED stockings applied.   2. Parkinson's with autonomic dysfunction. Continue carbidopa/levodopa, continue midodrine and Florinef supplementation. Monitor orthostatics. PT eval, social work consulted he is extremely frail and weak and will require placement.   3. Mild rhabdomyolysis due to fall along with ARF. Hydrate, monitor trend.   4. Reactionary leukocytosis due to fall and facial injury with bruising. Resolved. UA stable.   5. Anemia, ferritin stable, stable reticulocyte count. Outpatient workup.   6. Hyponatremia. Due to dehydration resolved with IV fluids continue at a gentle rate.     Code Status : Full  Family Communication  : None present  Disposition Plan  : To be decided  Consults   :  None  Procedures  : CT head and C-spine nonacute. X-ray left shoulder not acute  DVT Prophylaxis  :  SCD  Lab Results  Component Value Date   PLT 220 04/14/2015    Inpatient Medications  Scheduled Meds: . aspirin  325 mg Oral Daily  . bacitracin   Topical BID  . carbidopa-levodopa  1.5 tablet Oral TID  . docusate sodium  100 mg Oral BID  . ferrous sulfate  325 mg Oral Q breakfast  . fludrocortisone  100 mcg Oral BID  . midodrine  10 mg Oral TID WC  . PARoxetine  20 mg Oral Daily  . polycarbophil  625 mg Oral Daily  . potassium chloride  40 mEq Oral Daily   Continuous Infusions: . sodium chloride 50 mL/hr at 04/15/15 0730   PRN Meds:.acetaminophen **OR** acetaminophen, alum & mag hydroxide-simeth, [DISCONTINUED] ondansetron **OR** ondansetron (ZOFRAN) IV, polyethylene glycol  Antibiotics  :    Anti-infectives    None        Objective:   Filed Vitals:   04/14/15 1852 04/14/15 2318 04/15/15 0400 04/15/15 0841  BP: 112/45     Pulse: 71 64 64 76  Temp: 98.5 F (36.9 C) 98 F (36.7 C) 97.9 F (36.6 C) 98.2 F (36.8 C)  TempSrc: Oral Oral Oral Oral  Resp: 17 18 16 16   Height:      Weight:      SpO2:  95% 96% 98%    Wt  Readings from Last 3 Encounters:  04/13/15 64.4 kg (141 lb 15.6 oz)  09/24/14 72.122 kg (159 lb)  06/29/14 69.854 kg (154 lb)     Intake/Output Summary (Last 24 hours) at 04/15/15 1113 Last data filed at 04/15/15 0842  Gross per 24 hour  Intake   1375 ml  Output    650 ml  Net    725 ml     Physical Exam  Awake Alert, Oriented X 1, No new F.N deficits, Normal affect [place].extensive stable facial bruising from fall,PERRAL Supple Neck,No JVD, No cervical lymphadenopathy appriciated.  Symmetrical Chest wall movement, Good air movement bilaterally, CTAB RRR,No Gallops,Rubs or new Murmurs, No Parasternal Heave +ve B.Sounds, Abd Soft, No tenderness, No organomegaly appriciated, No rebound - guarding or rigidity. No Cyanosis, Clubbing or  edema, No new Rash      Data Review:   Micro Results No results found for this or any previous visit (from the past 240 hour(s)).  Radiology Reports Ct Head Wo Contrast  04/13/2015  CLINICAL DATA:  Unwitnessed fall in the bathroom this morning, landing face down on the floor. Swelling and bruising around both eyes and nose. Uncertain loss of consciousness. Initial encounter. EXAM: CT HEAD WITHOUT CONTRAST CT MAXILLOFACIAL WITHOUT CONTRAST CT CERVICAL SPINE WITHOUT CONTRAST TECHNIQUE: Multidetector CT imaging of the head, cervical spine, and maxillofacial structures were performed using the standard protocol without intravenous contrast. Multiplanar CT image reconstructions of the cervical spine and maxillofacial structures were also generated. COMPARISON:  Head CT 02/01/2015. Head, cervical, and maxillofacial CT 09/14/2013. FINDINGS: CT HEAD FINDINGS There is no evidence of acute cortical infarct, intracranial hemorrhage, mass, midline shift, or extra-axial fluid collection. Moderate cerebral atrophy is unchanged. Periventricular white-matter hypodensities are unchanged and nonspecific but compatible with mild-to-moderate chronic small vessel ischemic disease. Prior bilateral cataract extraction. Frontal scalp swelling/hematoma. Left maxillary sinus mucous retention cyst. Trace right mastoid effusion. No skull fracture. CT MAXILLOFACIAL FINDINGS There is soft tissue swelling/hematoma involving the frontal scalp soft tissues. No acute maxillofacial fracture is identified. Slight nasal bone deformity is chronic. Prior bilateral cataract extraction is noted. Small right sphenoid and moderate left maxillary sinus mucous retention cysts are present. There is a trace right mastoid effusion. CT CERVICAL SPINE FINDINGS Vertebral alignment is unchanged, with trace anterolisthesis again seen of C4 on C5 and C7 on T1. Multilevel disc space narrowing does not appear significantly changed, severe at C5-6, C6-7, and  T1-2. Mild, chronic T1 superior endplate compression fracture is unchanged. Facet ankylosis is noted on the right at C3-4 and bilaterally at C4-5. Prominent calcified pannus is again seen posterior to the dens with associated erosions of the dens as well as C1 lateral masses. Mild posterior subluxation of the right lateral mass with respect to C2 is unchanged. No acute cervical spine fracture is identified. Mild centrilobular emphysema is partially visualized in the lung apices. IMPRESSION: 1. No evidence of acute intracranial abnormality. 2. Moderate cerebral atrophy and mild-to-moderate chronic small vessel ischemic disease. 3. Frontal scalp hematoma.  No acute maxillofacial fracture. 4. No acute osseous abnormality in the cervical spine. Electronically Signed   By: Logan Bores M.D.   On: 04/13/2015 15:34   Ct Cervical Spine Wo Contrast  04/13/2015  CLINICAL DATA:  Unwitnessed fall in the bathroom this morning, landing face down on the floor. Swelling and bruising around both eyes and nose. Uncertain loss of consciousness. Initial encounter. EXAM: CT HEAD WITHOUT CONTRAST CT MAXILLOFACIAL WITHOUT CONTRAST CT CERVICAL SPINE WITHOUT CONTRAST TECHNIQUE: Multidetector CT  imaging of the head, cervical spine, and maxillofacial structures were performed using the standard protocol without intravenous contrast. Multiplanar CT image reconstructions of the cervical spine and maxillofacial structures were also generated. COMPARISON:  Head CT 02/01/2015. Head, cervical, and maxillofacial CT 09/14/2013. FINDINGS: CT HEAD FINDINGS There is no evidence of acute cortical infarct, intracranial hemorrhage, mass, midline shift, or extra-axial fluid collection. Moderate cerebral atrophy is unchanged. Periventricular white-matter hypodensities are unchanged and nonspecific but compatible with mild-to-moderate chronic small vessel ischemic disease. Prior bilateral cataract extraction. Frontal scalp swelling/hematoma. Left  maxillary sinus mucous retention cyst. Trace right mastoid effusion. No skull fracture. CT MAXILLOFACIAL FINDINGS There is soft tissue swelling/hematoma involving the frontal scalp soft tissues. No acute maxillofacial fracture is identified. Slight nasal bone deformity is chronic. Prior bilateral cataract extraction is noted. Small right sphenoid and moderate left maxillary sinus mucous retention cysts are present. There is a trace right mastoid effusion. CT CERVICAL SPINE FINDINGS Vertebral alignment is unchanged, with trace anterolisthesis again seen of C4 on C5 and C7 on T1. Multilevel disc space narrowing does not appear significantly changed, severe at C5-6, C6-7, and T1-2. Mild, chronic T1 superior endplate compression fracture is unchanged. Facet ankylosis is noted on the right at C3-4 and bilaterally at C4-5. Prominent calcified pannus is again seen posterior to the dens with associated erosions of the dens as well as C1 lateral masses. Mild posterior subluxation of the right lateral mass with respect to C2 is unchanged. No acute cervical spine fracture is identified. Mild centrilobular emphysema is partially visualized in the lung apices. IMPRESSION: 1. No evidence of acute intracranial abnormality. 2. Moderate cerebral atrophy and mild-to-moderate chronic small vessel ischemic disease. 3. Frontal scalp hematoma.  No acute maxillofacial fracture. 4. No acute osseous abnormality in the cervical spine. Electronically Signed   By: Logan Bores M.D.   On: 04/13/2015 15:34   Dg Shoulder Left  04/13/2015  CLINICAL DATA:  Lateral LEFT shoulder pain, fell last night, initial encounter EXAM: LEFT SHOULDER - 2+ VIEW COMPARISON:  09/14/2013 FINDINGS: Osseous demineralization. AC joint alignment normal. Minimal acromial spur formation. No acute fracture, dislocation, or bone destruction. Visualized LEFT ribs intact. IMPRESSION: No acute osseous abnormalities. Electronically Signed   By: Lavonia Dana M.D.   On:  04/13/2015 15:40   Ct Maxillofacial Wo Cm  04/13/2015  CLINICAL DATA:  Unwitnessed fall in the bathroom this morning, landing face down on the floor. Swelling and bruising around both eyes and nose. Uncertain loss of consciousness. Initial encounter. EXAM: CT HEAD WITHOUT CONTRAST CT MAXILLOFACIAL WITHOUT CONTRAST CT CERVICAL SPINE WITHOUT CONTRAST TECHNIQUE: Multidetector CT imaging of the head, cervical spine, and maxillofacial structures were performed using the standard protocol without intravenous contrast. Multiplanar CT image reconstructions of the cervical spine and maxillofacial structures were also generated. COMPARISON:  Head CT 02/01/2015. Head, cervical, and maxillofacial CT 09/14/2013. FINDINGS: CT HEAD FINDINGS There is no evidence of acute cortical infarct, intracranial hemorrhage, mass, midline shift, or extra-axial fluid collection. Moderate cerebral atrophy is unchanged. Periventricular white-matter hypodensities are unchanged and nonspecific but compatible with mild-to-moderate chronic small vessel ischemic disease. Prior bilateral cataract extraction. Frontal scalp swelling/hematoma. Left maxillary sinus mucous retention cyst. Trace right mastoid effusion. No skull fracture. CT MAXILLOFACIAL FINDINGS There is soft tissue swelling/hematoma involving the frontal scalp soft tissues. No acute maxillofacial fracture is identified. Slight nasal bone deformity is chronic. Prior bilateral cataract extraction is noted. Small right sphenoid and moderate left maxillary sinus mucous retention cysts are present. There is a  trace right mastoid effusion. CT CERVICAL SPINE FINDINGS Vertebral alignment is unchanged, with trace anterolisthesis again seen of C4 on C5 and C7 on T1. Multilevel disc space narrowing does not appear significantly changed, severe at C5-6, C6-7, and T1-2. Mild, chronic T1 superior endplate compression fracture is unchanged. Facet ankylosis is noted on the right at C3-4 and  bilaterally at C4-5. Prominent calcified pannus is again seen posterior to the dens with associated erosions of the dens as well as C1 lateral masses. Mild posterior subluxation of the right lateral mass with respect to C2 is unchanged. No acute cervical spine fracture is identified. Mild centrilobular emphysema is partially visualized in the lung apices. IMPRESSION: 1. No evidence of acute intracranial abnormality. 2. Moderate cerebral atrophy and mild-to-moderate chronic small vessel ischemic disease. 3. Frontal scalp hematoma.  No acute maxillofacial fracture. 4. No acute osseous abnormality in the cervical spine. Electronically Signed   By: Logan Bores M.D.   On: 04/13/2015 15:34     CBC  Recent Labs Lab 04/08/15 1123 04/13/15 1429 04/14/15 0331  WBC 7.7 12.5* 9.5  HGB 12.7* 10.2* 8.4*  HCT 38.0* 29.7* 24.6*  PLT 281.0 221 220  MCV 94.2 91.1 90.8  MCH  --  31.3 31.0  MCHC 33.3 34.3 34.1  RDW 13.4 12.6 12.8  LYMPHSABS 1.1 0.7  --   MONOABS 0.7 0.7  --   EOSABS 0.1 0.0  --   BASOSABS 0.0 0.0  --     Chemistries   Recent Labs Lab 04/08/15 1123 04/13/15 1429 04/14/15 0331 04/15/15 0446  NA 140 129* 130* 136  K 4.0 4.0 3.4* 3.6  CL 100 91* 95* 101  CO2 30 25 28 26   GLUCOSE 104* 139* 110* 103*  BUN 20 30* 35* 23*  CREATININE 1.08 1.97* 1.57* 0.96  CALCIUM 9.2 8.6* 8.3* 8.1*   ------------------------------------------------------------------------------------------------------------------ estimated creatinine clearance is 47.5 mL/min (by C-G formula based on Cr of 0.96). ------------------------------------------------------------------------------------------------------------------ No results for input(s): HGBA1C in the last 72 hours. ------------------------------------------------------------------------------------------------------------------ No results for input(s): CHOL, HDL, LDLCALC, TRIG, CHOLHDL, LDLDIRECT in the last 72  hours. ------------------------------------------------------------------------------------------------------------------  Recent Labs  04/13/15 1936  TSH 1.846   ------------------------------------------------------------------------------------------------------------------  Recent Labs  04/13/15 1936  VITAMINB12 705  FOLATE 38.4  FERRITIN 151  TIBC 308  IRON 34*  RETICCTPCT 1.6    Coagulation profile No results for input(s): INR, PROTIME in the last 168 hours.  No results for input(s): DDIMER in the last 72 hours.  Cardiac Enzymes No results for input(s): CKMB, TROPONINI, MYOGLOBIN in the last 168 hours.  Invalid input(s): CK ------------------------------------------------------------------------------------------------------------------ Invalid input(s): POCBNP   Time Spent in minutes  35   Kenyatte Gruber K M.D on 04/15/2015 at 11:13 AM  Between 7am to 7pm - Pager - [phone]  After 7pm go to [email or web address] - password [email or web address]  [facility] -  Office  [phone]

## 2015-04-15 NOTE — Progress Notes (Addendum)
PT Cancellation Note  Patient Details Name: Darius Castro MRN: [id number] DOB: 09/10/1923   Cancelled Treatment:    Reason Eval/Treat Not Completed: Medical issues which prohibited therapy BP 202/77 - Not appropriate for physical therapy evaluation at this time. Will follow-up when BP is better controlled for comprehensive PT evaluation.   Addendum: Checked back in afternoon for PT evaluation. RN states patient has become increasingly confused and agitated, attempting to climb out of bed frequently. Will check back tomorrow for evaluation.  Ellouise Newer 04/15/2015, 12:05 PM  Elayne Snare, [place]

## 2015-04-16 ENCOUNTER — Telehealth: Payer: Self-pay | Admitting: Family Medicine

## 2015-04-16 DIAGNOSIS — K5900 Constipation, unspecified: Secondary | ICD-10-CM

## 2015-04-16 DIAGNOSIS — N179 Acute kidney failure, unspecified: Secondary | ICD-10-CM

## 2015-04-16 DIAGNOSIS — E860 Dehydration: Secondary | ICD-10-CM

## 2015-04-16 LAB — CK: Total CK: 603 U/L — ABNORMAL HIGH (ref 49–397)

## 2015-04-16 MED ORDER — BACITRACIN ZINC 500 UNIT/GM EX OINT: TOPICAL_OINTMENT | Freq: Two times a day (BID) | CUTANEOUS | Status: DC

## 2015-04-16 MED ORDER — HALOPERIDOL 5 MG PO TABS
5.0000 mg | ORAL_TABLET | Freq: Once | ORAL | Status: DC
  Filled 2015-04-16: qty 1

## 2015-04-16 NOTE — Telephone Encounter (Signed)
SDA is fine.  

## 2015-04-16 NOTE — Telephone Encounter (Signed)
SDA slot ok? 

## 2015-04-16 NOTE — Care Management Note (Signed)
Case Management Note  Patient Details  Name: DANISH RUFFINS MRN: [id number] Date of Birth: 1923/10/30  Subjective/Objective:    CM following for progression and d/c planning.                Action/Plan: Pt to d/c to SNF at [facility] where he is a resident.   Expected Discharge Date:       04/16/2015           Expected Discharge Plan:  [place]  In-House Referral:  Clinical Social Work  Discharge planning Services  NA  Post Acute Care Choice:  NA Choice offered to:  NA  DME Arranged:   NA DME Agency:   NA  HH Arranged:   NA HH Agency:   NA  Status of Service:  Completed, signed off  Medicare Important Message Given:    Date Medicare IM Given:    Medicare IM give by:    Date Additional Medicare IM Given:    Additional Medicare Important Message give by:     If discussed at [place] of Stay Meetings, dates discussed:    Additional Comments:  Adron Bene, RN 04/16/2015, 12:44 PM

## 2015-04-16 NOTE — Clinical Social Work Note (Signed)
Patient discharged to [facility] healthcare facility for [employer], transported by daughter Zenaida Niece. Facility provided with discharge information.   Jacoria Keiffer Givens, MSW, LCSW Licensed Clinical Social Worker [place] [phone]

## 2015-04-16 NOTE — Discharge Summary (Signed)
Darius Castro, is a 81 y.o. male  DOB 30-Sep-1923  MRN [id number].  Admission date:  04/13/2015  Admitting Physician  Debbe Odea, MD  Discharge Date:  04/16/2015   Primary MD  Garret Reddish, MD  Recommendations for primary care physician for things to follow:   Check CBC, BMP and a 2 view chest x-ray next visit. Monitor orthostatics. Apply TED stockings when out of the bed.   Admission Diagnosis  Dehydration [E86.0] Forehead laceration, initial encounter [S01.81XA] Traumatic rhabdomyolysis, initial encounter ([place]) [T79.6XXA] Acute renal failure, unspecified acute renal failure type ([place]) [N17.9]   Discharge Diagnosis  Dehydration [E86.0] Forehead laceration, initial encounter [S01.81XA] Traumatic rhabdomyolysis, initial encounter ([place]) [T79.6XXA] Acute renal failure, unspecified acute renal failure type ([place]) [N17.9]     Principal Problem:   Acute renal failure (ARF) (HCC) Active Problems:   Hyperlipidemia   PROSTATE CANCER, HX OF   Hypertension   Constipation due to neurogenic bowel   Parkinson's disease ([place])   Autonomic orthostatic hypotension   Traumatic hematoma of head   Normocytic anemia   Hyponatremia   Rhabdomyolysis   Acute renal failure (HCC)   Dehydration      Past Medical History  Diagnosis Date  . Hyperlipidemia     takes Lipitor daily  . Adenomatous colon polyp 3/09  . Personal history of prostate cancer   . Dysphagia     with pills  . Hypertension     takes Fosinopril daily  . Sleep apnea     sleep study done in 03/30/04 in epic;doesn't use a CPAP  . Pneumonia     hx of as a child  . Arthritis     back and hip  . Dizziness     pt states when he is hot and stands up too fast  . Back pain     hx of ruptured disc  . Dry skin   . Hemorrhoid     internal   . Hx of  colonic polyps   . Nocturia   . Prostate cancer ([place])   . Impaired hearing   . 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 Diabetes mellitus     patient denies Diabetes  . COLONIC POLYPS, HX OF 11/05/2008    Qualifier: Diagnosis of  By: Julaine Hua CMA (AAMA), Estill Bamberg      Past Surgical History  Procedure Laterality Date  . Lumbar laminectomy  1998  . Knee arthroscopy  2001  . Rotator cuff repair Right 2/10  . Cataract extraction Bilateral   . Tonsillectomy      as a child  . Colonoscopy    . Radioactive seed implant  2008  . Total hip arthroplasty  08/24/2011    Procedure: TOTAL HIP ARTHROPLASTY;  Surgeon: Lorn Junes, MD;  Location: [facility];  Service: Orthopedics;  Laterality: Right;  DR Van Dyne  . Zenker's diverticulectomy  09/02/2011    Procedure: ZENKER'S DIVERTICULECTOMY;  Surgeon: Melida Quitter, MD;  Location: [place] [facility];  Service: ENT;  Laterality:  N/A;       HPI  from the history and physical done on the day of admission:    Darius Castro is a 81 y.o. male, with Parkinson's, autonomic orthostatic hypotension, mild cognitive deficit and history of multiple falls. He was brought to the ER after a fall. History was gathered from his daughter who states that the patient got up to go to the bathroom between 3 and 4 AM this morning he fell and hit his head. The patient's wife (who is 5 years old) called the patient's daughter for help at approximately 10 AM this morning as she could not get him to stand up. The patient has had no recent signs of illness: No chest pain, difficulty breathing, cough, congestion, abdominal pain, changes in bowel habits, or dysuria. In the emergency department the patient is found to have an elevated creatinine of 1.9 (baseline 1.1), he is dry with a sodium of 129 and a chloride of 91. CK is 1015. Fortunately all imaging is negative for fracture or displacement. UA is pending. We will admit the patient for acute renal failure. He  will likely need short-term SNF placement in [facility].     Hospital Course:    1. Fall with loss of consciousness, extensive facial injury and bruising. Denies any seizure-like episode, no bowel or bladder incontinence, does have history of autonomic dysfunction from underlying Parkinson's disease. This could have been the cause of his fall however he claims that he lost consciousness for at least 10-15 minutes. We will monitor on telemetry, stable echogram, he is orthostatic likely due to underlying Parkinson's with autonomic dysfunction. Improved with IV fluids for hydration, continue home dose midodrine and Florinef. TED stockings when out of the bed. Full assistance on ambulation with full fall precautions. He will remain very high for fall risk.   2. Parkinson's with autonomic dysfunction. Continue carbidopa/levodopa, continue midodrine and Florinef supplementation. He is orthostatic therefore TED stockings should be applied when he is getting out of the bed, monitor orthostatics in the outpatient setting as well. Will be placed to SNF.Marland Kitchen   3. Mild rhabdomyolysis due to fall along with ARF. Hydrate, stable trend.   4. Reactionary leukocytosis due to fall and facial injury with bruising. Resolved. UA stable.   5. AOCD, ferritin stable, stable reticulocyte count. Outpatient age-appropriate workup.   6. Hyponatremia. Due to dehydration resolved with IV fluids.         Discharge Condition: Fair  Follow UP  Follow-up Information    Follow up with Garret Reddish, MD. Schedule an appointment as soon as possible for a visit in 5 days.   Specialty:  Family Medicine   Contact information:   [address] [place] [address] [phone]        Consults obtained -  None  Diet and Activity recommendation: See Discharge Instructions below  Discharge Instructions       Discharge Instructions    Discharge instructions    Complete by:  As directed    Follow with Primary MD Garret Reddish, MD in 5 days   Get CBC, CMP, 2 view Chest X ray checked  by Primary MD next visit.    Activity: As tolerated with Full fall precautions use walker/cane & assistance as needed   Disposition SNF   Diet: Heart Healthy  with feeding assistance and aspiration precautions.  For Heart failure patients - Check your Weight same time everyday, if you gain over 2 pounds, or you develop in  leg swelling, experience more shortness of breath or chest pain, call your Primary MD immediately. Follow Cardiac Low Salt Diet and 1.5 lit/day fluid restriction.   On your next visit with your primary care physician please Get Medicines reviewed and adjusted.   Please request your Prim.MD to go over all Hospital Tests and Procedure/Radiological results at the follow up, please get all Hospital records sent to your Prim MD by signing hospital release before you go home.   If you experience worsening of your admission symptoms, develop shortness of breath, life threatening emergency, suicidal or homicidal thoughts you must seek medical attention immediately by calling 911 or calling your MD immediately  if symptoms less severe.  You Must read complete instructions/literature along with all the possible adverse reactions/side effects for all the Medicines you take and that have been prescribed to you. Take any new Medicines after you have completely understood and accpet all the possible adverse reactions/side effects.   Do not drive, operating heavy machinery, perform activities at heights, swimming or participation in water activities or provide baby sitting services if your were admitted for syncope or siezures until you have seen by Primary MD or a Neurologist and advised to do so again.  Do not drive when taking Pain medications.    Do not take more than prescribed Pain, Sleep and Anxiety Medications  Special Instructions: If you have smoked or chewed Tobacco  in the  last 2 yrs please stop smoking, stop any regular Alcohol  and or any Recreational drug use.  Wear Seat belts while driving.   Please note  You were cared for by a hospitalist during your hospital stay. If you have any questions about your discharge medications or the care you received while you were in the hospital after you are discharged, you can call the unit and asked to speak with the hospitalist on call if the hospitalist that took care of you is not available. Once you are discharged, your primary care physician will handle any further medical issues. Please note that NO REFILLS for any discharge medications will be authorized once you are discharged, as it is imperative that you return to your primary care physician (or establish a relationship with a primary care physician if you do not have one) for your aftercare needs so that they can reassess your need for medications and monitor your lab values.     Increase activity slowly    Complete by:  As directed              Discharge Medications       Medication List    TAKE these medications        aspirin 325 MG tablet  Take 325 mg by mouth daily.     bacitracin ointment  Apply topically 2 (two) times daily. To any skin abrasion     carbidopa-levodopa 25-100 MG tablet  Commonly known as:  SINEMET IR  Take 1.5 tablets by mouth 3 (three) times daily.     CENTRUM SILVER ULTRA WOMENS PO  Take 1 tablet by mouth daily.     Co Q-10 100 MG Caps  Take 100 mg by mouth 3 (three) times daily.     ferrous sulfate 325 (65 FE) MG tablet  Take 325 mg by mouth daily with breakfast.     fludrocortisone 0.1 MG tablet  Commonly known as:  FLORINEF  TAKE 1 TABLET BY MOUTH EVERY DAY     KLOR-CON M10 10 MEQ tablet  Generic drug:  potassium chloride  TAKE 1 TABLET (10 MEQ TOTAL) BY MOUTH DAILY.     midodrine 10 MG tablet  Commonly known as:  PROAMATINE  Take 1 tablet (10 mg total) by mouth 3 (three) times daily.     PARoxetine  20 MG tablet  Commonly known as:  PAXIL  Take 1 tablet (20 mg total) by mouth daily.     polycarbophil 625 MG tablet  Commonly known as:  FIBERCON  Take 625 mg by mouth daily.        Major procedures and Radiology Reports - PLEASE review detailed and final reports for all details, in brief -    TTE   - Left ventricle: The cavity size was normal. There was severefocal basal hypertrophy. Systolic function was normal. Theestimated ejection fraction was in the range of 60% to 65%. Wallmotion was normal; there were no regional wall motionabnormalities. There was an increased relative contribution ofatrial contraction to ventricular filling. Doppler parameters areconsistent with abnormal left ventricular relaxation (grade 1diastolic dysfunction). - Mitral valve: There was trivial regurgitation. - Left atrium: The atrium was mildly dilated. - Pulmonary arteries: PA peak pressure: 54 mm Hg (S).  Impressions:  The right ventricular systolic pressure was increased consistentwith moderate pulmonary hypertension.    Ct Head Wo Contrast  04/13/2015  CLINICAL DATA:  Unwitnessed fall in the bathroom this morning, landing face down on the floor. Swelling and bruising around both eyes and nose. Uncertain loss of consciousness. Initial encounter. EXAM: CT HEAD WITHOUT CONTRAST CT MAXILLOFACIAL WITHOUT CONTRAST CT CERVICAL SPINE WITHOUT CONTRAST TECHNIQUE: Multidetector CT imaging of the head, cervical spine, and maxillofacial structures were performed using the standard protocol without intravenous contrast. Multiplanar CT image reconstructions of the cervical spine and maxillofacial structures were also generated. COMPARISON:  Head CT 02/01/2015. Head, cervical, and maxillofacial CT 09/14/2013. FINDINGS: CT HEAD FINDINGS There is no evidence of acute cortical infarct, intracranial hemorrhage, mass, midline shift, or extra-axial fluid collection. Moderate cerebral atrophy is unchanged.  Periventricular white-matter hypodensities are unchanged and nonspecific but compatible with mild-to-moderate chronic small vessel ischemic disease. Prior bilateral cataract extraction. Frontal scalp swelling/hematoma. Left maxillary sinus mucous retention cyst. Trace right mastoid effusion. No skull fracture. CT MAXILLOFACIAL FINDINGS There is soft tissue swelling/hematoma involving the frontal scalp soft tissues. No acute maxillofacial fracture is identified. Slight nasal bone deformity is chronic. Prior bilateral cataract extraction is noted. Small right sphenoid and moderate left maxillary sinus mucous retention cysts are present. There is a trace right mastoid effusion. CT CERVICAL SPINE FINDINGS Vertebral alignment is unchanged, with trace anterolisthesis again seen of C4 on C5 and C7 on T1. Multilevel disc space narrowing does not appear significantly changed, severe at C5-6, C6-7, and T1-2. Mild, chronic T1 superior endplate compression fracture is unchanged. Facet ankylosis is noted on the right at C3-4 and bilaterally at C4-5. Prominent calcified pannus is again seen posterior to the dens with associated erosions of the dens as well as C1 lateral masses. Mild posterior subluxation of the right lateral mass with respect to C2 is unchanged. No acute cervical spine fracture is identified. Mild centrilobular emphysema is partially visualized in the lung apices. IMPRESSION: 1. No evidence of acute intracranial abnormality. 2. Moderate cerebral atrophy and mild-to-moderate chronic small vessel ischemic disease. 3. Frontal scalp hematoma.  No acute maxillofacial fracture. 4. No acute osseous abnormality in the cervical spine. Electronically Signed   By: Logan Bores M.D.   On: 04/13/2015 15:34   Ct Cervical Spine Wo  Contrast  04/13/2015  CLINICAL DATA:  Unwitnessed fall in the bathroom this morning, landing face down on the floor. Swelling and bruising around both eyes and nose. Uncertain loss of  consciousness. Initial encounter. EXAM: CT HEAD WITHOUT CONTRAST CT MAXILLOFACIAL WITHOUT CONTRAST CT CERVICAL SPINE WITHOUT CONTRAST TECHNIQUE: Multidetector CT imaging of the head, cervical spine, and maxillofacial structures were performed using the standard protocol without intravenous contrast. Multiplanar CT image reconstructions of the cervical spine and maxillofacial structures were also generated. COMPARISON:  Head CT 02/01/2015. Head, cervical, and maxillofacial CT 09/14/2013. FINDINGS: CT HEAD FINDINGS There is no evidence of acute cortical infarct, intracranial hemorrhage, mass, midline shift, or extra-axial fluid collection. Moderate cerebral atrophy is unchanged. Periventricular white-matter hypodensities are unchanged and nonspecific but compatible with mild-to-moderate chronic small vessel ischemic disease. Prior bilateral cataract extraction. Frontal scalp swelling/hematoma. Left maxillary sinus mucous retention cyst. Trace right mastoid effusion. No skull fracture. CT MAXILLOFACIAL FINDINGS There is soft tissue swelling/hematoma involving the frontal scalp soft tissues. No acute maxillofacial fracture is identified. Slight nasal bone deformity is chronic. Prior bilateral cataract extraction is noted. Small right sphenoid and moderate left maxillary sinus mucous retention cysts are present. There is a trace right mastoid effusion. CT CERVICAL SPINE FINDINGS Vertebral alignment is unchanged, with trace anterolisthesis again seen of C4 on C5 and C7 on T1. Multilevel disc space narrowing does not appear significantly changed, severe at C5-6, C6-7, and T1-2. Mild, chronic T1 superior endplate compression fracture is unchanged. Facet ankylosis is noted on the right at C3-4 and bilaterally at C4-5. Prominent calcified pannus is again seen posterior to the dens with associated erosions of the dens as well as C1 lateral masses. Mild posterior subluxation of the right lateral mass with respect to C2 is  unchanged. No acute cervical spine fracture is identified. Mild centrilobular emphysema is partially visualized in the lung apices. IMPRESSION: 1. No evidence of acute intracranial abnormality. 2. Moderate cerebral atrophy and mild-to-moderate chronic small vessel ischemic disease. 3. Frontal scalp hematoma.  No acute maxillofacial fracture. 4. No acute osseous abnormality in the cervical spine. Electronically Signed   By: Logan Bores M.D.   On: 04/13/2015 15:34   Dg Shoulder Left  04/13/2015  CLINICAL DATA:  Lateral LEFT shoulder pain, fell last night, initial encounter EXAM: LEFT SHOULDER - 2+ VIEW COMPARISON:  09/14/2013 FINDINGS: Osseous demineralization. AC joint alignment normal. Minimal acromial spur formation. No acute fracture, dislocation, or bone destruction. Visualized LEFT ribs intact. IMPRESSION: No acute osseous abnormalities. Electronically Signed   By: Lavonia Dana M.D.   On: 04/13/2015 15:40   Ct Maxillofacial Wo Cm  04/13/2015  CLINICAL DATA:  Unwitnessed fall in the bathroom this morning, landing face down on the floor. Swelling and bruising around both eyes and nose. Uncertain loss of consciousness. Initial encounter. EXAM: CT HEAD WITHOUT CONTRAST CT MAXILLOFACIAL WITHOUT CONTRAST CT CERVICAL SPINE WITHOUT CONTRAST TECHNIQUE: Multidetector CT imaging of the head, cervical spine, and maxillofacial structures were performed using the standard protocol without intravenous contrast. Multiplanar CT image reconstructions of the cervical spine and maxillofacial structures were also generated. COMPARISON:  Head CT 02/01/2015. Head, cervical, and maxillofacial CT 09/14/2013. FINDINGS: CT HEAD FINDINGS There is no evidence of acute cortical infarct, intracranial hemorrhage, mass, midline shift, or extra-axial fluid collection. Moderate cerebral atrophy is unchanged. Periventricular white-matter hypodensities are unchanged and nonspecific but compatible with mild-to-moderate chronic small vessel  ischemic disease. Prior bilateral cataract extraction. Frontal scalp swelling/hematoma. Left maxillary sinus mucous retention cyst. Trace right mastoid  effusion. No skull fracture. CT MAXILLOFACIAL FINDINGS There is soft tissue swelling/hematoma involving the frontal scalp soft tissues. No acute maxillofacial fracture is identified. Slight nasal bone deformity is chronic. Prior bilateral cataract extraction is noted. Small right sphenoid and moderate left maxillary sinus mucous retention cysts are present. There is a trace right mastoid effusion. CT CERVICAL SPINE FINDINGS Vertebral alignment is unchanged, with trace anterolisthesis again seen of C4 on C5 and C7 on T1. Multilevel disc space narrowing does not appear significantly changed, severe at C5-6, C6-7, and T1-2. Mild, chronic T1 superior endplate compression fracture is unchanged. Facet ankylosis is noted on the right at C3-4 and bilaterally at C4-5. Prominent calcified pannus is again seen posterior to the dens with associated erosions of the dens as well as C1 lateral masses. Mild posterior subluxation of the right lateral mass with respect to C2 is unchanged. No acute cervical spine fracture is identified. Mild centrilobular emphysema is partially visualized in the lung apices. IMPRESSION: 1. No evidence of acute intracranial abnormality. 2. Moderate cerebral atrophy and mild-to-moderate chronic small vessel ischemic disease. 3. Frontal scalp hematoma.  No acute maxillofacial fracture. 4. No acute osseous abnormality in the cervical spine. Electronically Signed   By: Logan Bores M.D.   On: 04/13/2015 15:34    Micro Results      Recent Results (from the past 240 hour(s))  Urine culture     Status: None   Collection Time: 04/14/15  8:59 AM  Result Value Ref Range Status   Specimen Description URINE, RANDOM  Final   Special Requests NONE  Final   Culture MULTIPLE SPECIES PRESENT, SUGGEST RECOLLECTION  Final   Report Status 04/15/2015 FINAL   Final       Today   Subjective    Ezekeil Bethel today has no headache,no chest abdominal pain,no new weakness tingling or numbness, feels much better.   Objective   Blood pressure 178/77, pulse 66, temperature 97.8 F (36.6 C), temperature source Oral, resp. rate 18, height 5\' 8"  (1.727 m), weight 65 kg (143 lb 4.8 oz), SpO2 99 %.   Intake/Output Summary (Last 24 hours) at 04/16/15 0940 Last data filed at 04/16/15 0259  Gross per 24 hour  Intake    440 ml  Output   3350 ml  Net  -2910 ml    Exam Awake Alert, Oriented x 3, No new F.N deficits, Normal affect [place], large facial bruise from previous injury,PERRAL Supple Neck,No JVD, No cervical lymphadenopathy appriciated.  Symmetrical Chest wall movement, Good air movement bilaterally, CTAB RRR,No Gallops,Rubs or new Murmurs, No Parasternal Heave +ve B.Sounds, Abd Soft, Non tender, No organomegaly appriciated, No rebound -guarding or rigidity. No Cyanosis, Clubbing or edema, No new Rash or bruise   Data Review   CBC w Diff: Lab Results  Component Value Date   WBC 9.5 04/14/2015   HGB 8.4* 04/14/2015   HCT 24.6* 04/14/2015   PLT 220 04/14/2015   LYMPHOPCT 6 04/13/2015   MONOPCT 6 04/13/2015   EOSPCT 0 04/13/2015   BASOPCT 0 04/13/2015    CMP: Lab Results  Component Value Date   NA 136 04/15/2015   K 3.6 04/15/2015   CL 101 04/15/2015   CO2 26 04/15/2015   BUN 23* 04/15/2015   CREATININE 0.96 04/15/2015   PROT 6.2 05/31/2014   ALBUMIN 4.0 05/31/2014   BILITOT 0.5 05/31/2014   ALKPHOS 48 05/31/2014   AST 24 05/31/2014   ALT 14 05/31/2014  .   Total Time in  preparing paper work, data evaluation and todays exam - 35 minutes  Thurnell Lose M.D on 04/16/2015 at 9:40 AM  [facility]   Office  [phone]

## 2015-04-16 NOTE — Discharge Planning (Signed)
Report called to [facility]. SNF packet given to pt's daughter. Pt discharged in stable condition via wheelchair.

## 2015-04-16 NOTE — Telephone Encounter (Signed)
Yes you may.

## 2015-04-16 NOTE — Telephone Encounter (Signed)
Pt is being discharge today from [facility] and needs post hospital follow up in 5 days. Can I use sda slot?

## 2015-04-16 NOTE — Progress Notes (Signed)
Spoke with staff at Dr. Ansel Bong office. They will call pt's wife with hospital follow up appointment.

## 2015-04-16 NOTE — Discharge Instructions (Signed)
Follow with Primary MD Garret Reddish, MD in 5 days   Get CBC, CMP, 2 view Chest X ray checked  by Primary MD next visit.    Activity: As tolerated with Full fall precautions use walker/cane & assistance as needed   Disposition SNF   Diet: Heart Healthy  with feeding assistance and aspiration precautions.  For Heart failure patients - Check your Weight same time everyday, if you gain over 2 pounds, or you develop in leg swelling, experience more shortness of breath or chest pain, call your Primary MD immediately. Follow Cardiac Low Salt Diet and 1.5 lit/day fluid restriction.   On your next visit with your primary care physician please Get Medicines reviewed and adjusted.   Please request your Prim.MD to go over all Hospital Tests and Procedure/Radiological results at the follow up, please get all Hospital records sent to your Prim MD by signing hospital release before you go home.   If you experience worsening of your admission symptoms, develop shortness of breath, life threatening emergency, suicidal or homicidal thoughts you must seek medical attention immediately by calling 911 or calling your MD immediately  if symptoms less severe.  You Must read complete instructions/literature along with all the possible adverse reactions/side effects for all the Medicines you take and that have been prescribed to you. Take any new Medicines after you have completely understood and accpet all the possible adverse reactions/side effects.   Do not drive, operating heavy machinery, perform activities at heights, swimming or participation in water activities or provide baby sitting services if your were admitted for syncope or siezures until you have seen by Primary MD or a Neurologist and advised to do so again.  Do not drive when taking Pain medications.    Do not take more than prescribed Pain, Sleep and Anxiety Medications  Special Instructions: If you have smoked or chewed Tobacco  in the  last 2 yrs please stop smoking, stop any regular Alcohol  and or any Recreational drug use.  Wear Seat belts while driving.   Please note  You were cared for by a hospitalist during your hospital stay. If you have any questions about your discharge medications or the care you received while you were in the hospital after you are discharged, you can call the unit and asked to speak with the hospitalist on call if the hospitalist that took care of you is not available. Once you are discharged, your primary care physician will handle any further medical issues. Please note that NO REFILLS for any discharge medications will be authorized once you are discharged, as it is imperative that you return to your primary care physician (or establish a relationship with a primary care physician if you do not have one) for your aftercare needs so that they can reassess your need for medications and monitor your lab values.

## 2015-04-16 NOTE — Care Management Important Message (Signed)
Important Message  Patient Details  Name: Darius Castro MRN: [id number] Date of Birth: 04-29-1924   Medicare Important Message Given:  Yes-second notification given    Delorse Lek 04/16/2015, 2:08 PM

## 2015-04-16 NOTE — Clinical Social Work Note (Signed)
Clinical Social Work Assessment  Patient Details  Name: Darius Castro MRN: [id number] Date of Birth: [birth year]  Date of referral:  04/13/15               Reason for consult:  Facility Placement                Permission sought to share information with:  Family Supports (CSW talked to daughter as patient disoriented x 4) Permission granted to share information::     Name::     Zenaida Niece  Agency::     Relationship::  Daughter  Contact Information:  [phone]  Housing/Transportation Living arrangements for the past 2 months:  [place] (Patient from [place] ) Source of Information:  Adult Children, Facility Patient Interpreter Needed:  None Criminal Activity/Legal Involvement Pertinent to Current Situation/Hospitalization:  No - Comment as needed Significant Relationships:  Adult Children Lives with:  Spouse Do you feel safe going back to the place where you live?  Yes Need for family participation in patient care:  Yes (Comment)  Care giving concerns:  None expressed by daughter   Facilities manager / plan:  CSW talked with daughter, Ms. Cooper regarding patient's discharge back to [facility] healthcare for rehab and she is in agreement and was aware that patient would be going to the healthcare facility for [employer]. Ms. Burt Knack also informed CSW that she would be transporting patient.  Employment status:  Retired Forensic scientist:  Information systems manager, Programmer, applications PT Recommendations:  [place] / Referral to community resources:   (None needed or requested)  Patient/Family's Response to care: No concerns expressed  Patient/Family's Understanding of and Emotional Response to Diagnosis, Current Treatment, and Prognosis:  Not discussed  Emotional Assessment Appearance:  Other (Comment Required (Did not visit with patient as he is disoriented x 4) Attitude/Demeanor/Rapport:  Unable to  Assess Affect (typically observed):  Unable to Assess Orientation:   (Not oriented x 4) Alcohol / Substance use:  Tobacco Use (Patient stopped smoking years ago and does not drink or use illicit drugs) Psych involvement (Current and /or in the community):  No (Comment)  Discharge Needs  Concerns to be addressed:  Discharge Planning Concerns Readmission within the last 30 days:    Current discharge risk:  None Barriers to Discharge:  No Barriers Identified   Sable Feil, LCSW 04/16/2015, 7:48 PM

## 2015-04-16 NOTE — Telephone Encounter (Signed)
Pt wife is aware of appt 04-22-15 at 415 pm

## 2015-04-16 NOTE — Evaluation (Signed)
Physical Therapy Evaluation Patient Details Name: Darius Castro MRN: [id number] DOB: 22-Feb-1924 Today's Date: 04/16/2015   History of Present Illness  81 y.o. male admitted after fall, found to have acute renal failure, dehydration, and rhabdomyolysis.  Clinical Impression  Pt admitted with above diagnosis. Pt currently with functional limitations due to the deficits listed below (see PT Problem List). Presents today with lethargy and confusion limiting full assessment. Able to stand pivot transfer with +2 mod assist due to posterior loss of balance. Follows single step commands inconsistently. Pt will benefit from skilled PT to increase their independence and safety with mobility to allow discharge to the venue listed below.       Follow Up Recommendations SNF;Supervision/Assistance - 24 hour    Equipment Recommendations  None recommended by PT    Recommendations for Other Services       Precautions / Restrictions Precautions Precautions: Fall Restrictions Weight Bearing Restrictions: No      Mobility  Bed Mobility Overal bed mobility: Needs Assistance Bed Mobility: Supine to Sit     Supine to sit: Mod assist;HOB elevated     General bed mobility comments: Mod assist for LE support and truncal assist to EOB. Poor ability to follow commands. VC for technique throughout.  Transfers Overall transfer level: Needs assistance Equipment used: Rolling walker (2 wheeled) Transfers: Sit to/from [facility] Sit to Stand: Mod assist;+2 physical assistance Stand pivot transfers: Min assist;+2 safety/equipment       General transfer comment: Mod assist for boost and balance to stand as he pushes posteriorly to rise. VC for hand placement and technique. Min assist +2 for safety with pivot to chair. Tolerated standing for short period of time focusion on upright posture and weight shift towards anterior. Assistance for walker control. Difficulty sequencing steps with  pivot.  Ambulation/Gait             General Gait Details: Unsafe to attempt at this time due to inability to follow commands and lethargy.  Stairs            Wheelchair Mobility    Modified Rankin (Stroke Patients Only)       Balance Overall balance assessment: Needs assistance Sitting-balance support: No upper extremity supported;Feet supported Sitting balance-Leahy Scale: Fair     Standing balance support: Bilateral upper extremity supported Standing balance-Leahy Scale: Poor                               Pertinent Vitals/Pain Pain Assessment: No/denies pain    Home Living Family/patient expects to be discharged to:: Private residence Living Arrangements: Spouse/significant other Available Help at Discharge: Family;Available 24 hours/day Type of Home: House Home Access: Level entry     Home Layout: One level Home Equipment: Cane - single point;Wheelchair - manual;Grab bars - toilet;Grab bars - tub/shower Additional Comments: Pt unable to provide a history due to lethargy and confusion. Information obtained from previous notes.    Prior Function Level of Independence: Needs assistance      ADL's / Homemaking Assistance Needed: Per pt PTA he was recieving assistance for lower body dressing from daughter and daughter's husband.  Comments: Pt unable to provide a history due to lethargy and confusion. Information obtained from previous notes.     Hand Dominance   Dominant Hand: Right    Extremity/Trunk Assessment   Upper Extremity Assessment: Defer to OT evaluation  Lower Extremity Assessment: Difficult to assess due to impaired cognition         Communication   Communication: HOH  Cognition Arousal/Alertness: Lethargic Behavior During Therapy: Flat affect Overall Cognitive Status: No family/caregiver present to determine baseline cognitive functioning Area of Impairment: Orientation;Following commands;Problem  solving Orientation Level: Disoriented to;Time;Situation     Following Commands: Follows one step commands inconsistently     Problem Solving: Decreased initiation;Slow processing;Difficulty sequencing;Requires verbal cues;Requires tactile cues General Comments: states it is march 2714    General Comments General comments (skin integrity, edema, etc.): Very lethargic, difficulty following commands. Good functional strength but difficulty following commands causing unsafe mobility without assistance. Leans posteriorly in standing    Exercises        Assessment/Plan    PT Assessment Patient needs continued PT services  PT Diagnosis Difficulty walking;Altered mental status   PT Problem List Decreased range of motion;Decreased activity tolerance;Decreased balance;Decreased mobility;Decreased cognition;Decreased coordination;Decreased knowledge of use of DME;Decreased safety awareness;Decreased knowledge of precautions  PT Treatment Interventions Gait training;DME instruction;Functional mobility training;Therapeutic activities;Therapeutic exercise;Balance training;Neuromuscular re-education;Cognitive remediation;Patient/family education   PT Goals (Current goals can be found in the Care Plan section) Acute Rehab PT Goals Patient Stated Goal: none stated PT Goal Formulation: Patient unable to participate in goal setting Time For Goal Achievement: 04/30/15 Potential to Achieve Goals: Fair    Frequency Min 2X/week   Barriers to discharge Other (comment) (Needs higher level care)      Co-evaluation               End of Session Equipment Utilized During Treatment: Gait belt Activity Tolerance: Patient limited by lethargy Patient left: in chair;with call bell/phone within reach;with chair alarm set;with nursing/sitter in room Nurse Communication: Mobility status;Precautions (Spoke with NT in room)         Time: 9163-8466 PT Time Calculation (min) (ACUTE ONLY): 11  min   Charges:   PT Evaluation $Initial PT Evaluation Tier I: 1 Procedure     PT G CodesEllouise Newer 04/16/2015, 11:35 AM Elayne Snare, [place]

## 2015-04-17 ENCOUNTER — Telehealth: Payer: Self-pay | Admitting: Family Medicine

## 2015-04-17 ENCOUNTER — Telehealth: Payer: Self-pay | Admitting: Cardiology

## 2015-04-17 NOTE — Telephone Encounter (Signed)
Pt has hospital follow up appt next week. However pt took a fall and was in hospital several days.  There were some findings she is concerned about and would like to speak with Dr Yong Channel asap, Since appt is not until next week.  Zenaida Niece  [phone] (M)

## 2015-04-17 NOTE — Telephone Encounter (Signed)
See below

## 2015-04-17 NOTE — Telephone Encounter (Signed)
New Message  Pt daughter requesting to speak w/ Karlene Einstein- concerning "special question" pt would not specify. Please call back and discuss.

## 2015-04-17 NOTE — Telephone Encounter (Signed)
Informed the pts daughter that per Dr Meda Coffee, the pt is really more anemic now, and she thinks that its totally reasonable to see the pts PCP and request for a referral to see a GI Specialist for decreasing blood counts.  Daughter verbalized understanding and very gracious for all the assistance provided.

## 2015-04-17 NOTE — Telephone Encounter (Signed)
He is really more anemic now, I think that its totally reasonable to see PCP and GI doc.

## 2015-04-17 NOTE — Telephone Encounter (Signed)
Pts daughter calling to inform Dr Meda Coffee that the pt was recently admitted and discharged from the hospital for falling and hitting his head.  Per the daughter she noted that she was reading the pts discharge papers/labs and noted his blood counts have continued to drop, even with the pt taking iron every day.  Pts daughter wanted to ask if its reasonable to request from the pts PCP, who he will follow-up with next Tuesday, for post-hospital follow-up, to refer the pt to GI for possible investigation of low blood counts.  Informed the pts daughter that this sounds like a reasonable request, but I will most certainly route this message to Dr Meda Coffee to see if she agrees with this.  Pts daughter verbalized understanding and agrees with this plan.  Daughter gracious for all the assistance provided.

## 2015-04-17 NOTE — Telephone Encounter (Signed)
LVM to leave questions as daughter did not pick up and I will be out of office rest of day.

## 2015-04-18 ENCOUNTER — Non-Acute Institutional Stay (SKILLED_NURSING_FACILITY): Payer: Medicare Other | Admitting: Internal Medicine

## 2015-04-18 DIAGNOSIS — R410 Disorientation, unspecified: Secondary | ICD-10-CM

## 2015-04-18 DIAGNOSIS — G3183 Dementia with Lewy bodies: Secondary | ICD-10-CM | POA: Diagnosis not present

## 2015-04-18 DIAGNOSIS — G20 Parkinson's disease: Secondary | ICD-10-CM | POA: Diagnosis not present

## 2015-04-18 DIAGNOSIS — M6282 Rhabdomyolysis: Secondary | ICD-10-CM

## 2015-04-18 DIAGNOSIS — R2681 Unsteadiness on feet: Secondary | ICD-10-CM | POA: Diagnosis not present

## 2015-04-18 DIAGNOSIS — I679 Cerebrovascular disease, unspecified: Secondary | ICD-10-CM

## 2015-04-18 DIAGNOSIS — M159 Polyosteoarthritis, unspecified: Secondary | ICD-10-CM

## 2015-04-18 DIAGNOSIS — Z9181 History of falling: Secondary | ICD-10-CM

## 2015-04-18 DIAGNOSIS — R1310 Dysphagia, unspecified: Secondary | ICD-10-CM

## 2015-04-18 DIAGNOSIS — I951 Orthostatic hypotension: Secondary | ICD-10-CM

## 2015-04-18 DIAGNOSIS — N170 Acute kidney failure with tubular necrosis: Secondary | ICD-10-CM | POA: Diagnosis not present

## 2015-04-18 DIAGNOSIS — E785 Hyperlipidemia, unspecified: Secondary | ICD-10-CM

## 2015-04-18 DIAGNOSIS — F32A Depression, unspecified: Secondary | ICD-10-CM

## 2015-04-18 DIAGNOSIS — F0280 Dementia in other diseases classified elsewhere without behavioral disturbance: Secondary | ICD-10-CM

## 2015-04-18 DIAGNOSIS — M150 Primary generalized (osteo)arthritis: Secondary | ICD-10-CM | POA: Diagnosis not present

## 2015-04-18 DIAGNOSIS — F329 Major depressive disorder, single episode, unspecified: Secondary | ICD-10-CM

## 2015-04-18 DIAGNOSIS — G20A1 Parkinson's disease without dyskinesia, without mention of fluctuations: Secondary | ICD-10-CM

## 2015-04-18 DIAGNOSIS — I10 Essential (primary) hypertension: Secondary | ICD-10-CM | POA: Diagnosis not present

## 2015-04-18 NOTE — Telephone Encounter (Addendum)
pls call daughter her cell phone at [phone]   Daughter states Dr Jeanmarie Hubert at [facility] is attending physician in the skilled care unit where they have placed pt. Dr green doesn't feel pt should come out for an appt and he will follow up/treat  pt in [facility]. Pt's lab came back from the hospital  w/ some irregularities and per Dr Meda Coffee she advised pt get a referral to GI when pt saw you next Tues. But now that is not going to happen. Daughter wants to know if we could send the notes concerning gi referral so that onsite dr can review. Sorry about the phone number!  Dr Jeanmarie Hubert [facility] Phone: [phone]

## 2015-04-19 ENCOUNTER — Encounter: Payer: Self-pay | Admitting: Nurse Practitioner

## 2015-04-19 ENCOUNTER — Non-Acute Institutional Stay (SKILLED_NURSING_FACILITY): Payer: Medicare Other | Admitting: Nurse Practitioner

## 2015-04-19 DIAGNOSIS — I951 Orthostatic hypotension: Secondary | ICD-10-CM | POA: Diagnosis not present

## 2015-04-19 DIAGNOSIS — D649 Anemia, unspecified: Secondary | ICD-10-CM

## 2015-04-19 DIAGNOSIS — F329 Major depressive disorder, single episode, unspecified: Secondary | ICD-10-CM | POA: Diagnosis not present

## 2015-04-19 DIAGNOSIS — S0093XS Contusion of unspecified part of head, sequela: Secondary | ICD-10-CM | POA: Diagnosis not present

## 2015-04-19 DIAGNOSIS — G3183 Dementia with Lewy bodies: Secondary | ICD-10-CM | POA: Diagnosis not present

## 2015-04-19 DIAGNOSIS — G20 Parkinson's disease: Secondary | ICD-10-CM | POA: Diagnosis not present

## 2015-04-19 DIAGNOSIS — F0280 Dementia in other diseases classified elsewhere without behavioral disturbance: Secondary | ICD-10-CM | POA: Insufficient documentation

## 2015-04-19 DIAGNOSIS — E876 Hypokalemia: Secondary | ICD-10-CM | POA: Insufficient documentation

## 2015-04-19 DIAGNOSIS — K592 Neurogenic bowel, not elsewhere classified: Secondary | ICD-10-CM

## 2015-04-19 DIAGNOSIS — K5900 Constipation, unspecified: Secondary | ICD-10-CM

## 2015-04-19 DIAGNOSIS — E871 Hypo-osmolality and hyponatremia: Secondary | ICD-10-CM | POA: Diagnosis not present

## 2015-04-19 DIAGNOSIS — F32A Depression, unspecified: Secondary | ICD-10-CM

## 2015-04-19 HISTORY — DX: Depression, unspecified: F32.A

## 2015-04-19 LAB — CBC AND DIFFERENTIAL
HCT: 28 % — AB (ref 41–53)
Hemoglobin: 9.4 g/dL — AB (ref 13.5–17.5)
Platelets: 324 10*3/uL (ref 150–399)
WBC: 7.8 10^3/mL

## 2015-04-19 LAB — BASIC METABOLIC PANEL
BUN: 30 mg/dL — AB (ref 4–21)
CREATININE: 1 mg/dL (ref 0.6–1.3)
Glucose: 111 mg/dL
POTASSIUM: 3.5 mmol/L (ref 3.4–5.3)
Sodium: 130 mmol/L — AB (ref 137–147)

## 2015-04-19 LAB — TSH: TSH: 3.19 u[IU]/mL (ref 0.41–5.90)

## 2015-04-19 LAB — HEPATIC FUNCTION PANEL
ALK PHOS: 67 U/L (ref 25–125)
ALT: 21 U/L (ref 10–40)
AST: 26 U/L (ref 14–40)
Bilirubin, Total: 0.4 mg/dL

## 2015-04-19 NOTE — Assessment & Plan Note (Signed)
04/18/15 K 3.5, continue Kcl 97meq daily.

## 2015-04-19 NOTE — Assessment & Plan Note (Signed)
Flat affect noted, continue Paxil 20mg  daily.

## 2015-04-19 NOTE — Assessment & Plan Note (Signed)
Stable, continue fibercon daily.

## 2015-04-19 NOTE — Assessment & Plan Note (Addendum)
04/19/15 Hgb 9.6, Vit B12 806, folate 17.8 continue FE 325mg  daily.

## 2015-04-19 NOTE — Assessment & Plan Note (Signed)
04/17/15 MMSE 21/30, may consider memory preserving meds in the future.

## 2015-04-19 NOTE — Assessment & Plan Note (Signed)
Gait instability, falling, continue Sinemet 25/100 1.5 tab tid.

## 2015-04-19 NOTE — Assessment & Plan Note (Signed)
Healing facial contusion.

## 2015-04-19 NOTE — Telephone Encounter (Signed)
Lm on pt vm tcb  

## 2015-04-19 NOTE — Assessment & Plan Note (Signed)
Chronic, Na 129 04/18/15, observe.

## 2015-04-19 NOTE — Telephone Encounter (Signed)
Spoke with Daughter- she is not sure what information Dr. Nyoka Cowden needs  Called Dr. Nyoka Cowden- he is on vacation but spoke with his CMA. He saw patient yesterday at [facility]. His nurse practitioner is available if any needs arise while he is out of office.   He has access to epic so he can see all of former labs and labs in hospital as well as discharge summary. She confirms with me that Dr. Nyoka Cowden does not need any more information from [place]  Please call daughter to inform her of thsi

## 2015-04-19 NOTE — Assessment & Plan Note (Addendum)
Florinef 0.1mg  BID, Proamatine 10mg  tid, Bp lying 136/68, standing 71/44.

## 2015-04-19 NOTE — Progress Notes (Signed)
Patient ID: Darius Castro, male   DOB: Aug 06, 1923, 81 y.o.   MRN: [id number]  Location:  SNF [facility] Provider:  Marlana Latus NP  Code Status:  Full code Goals of care: Advanced Directive information    Chief Complaint  Patient presents with  . Medical Management of Chronic Issues     HPI: Patient is a 81 y.o. male seen in the SNF at [facility] today for evaluation of fall x2 since admitted to SNF, orthostatic hypotension noted, taking Midodrine and Florinef to maintain normotensive, CXR 04/17/15 showed no acute cardiopulmonary pathology.  04/13/15-04/16/15 hospital stay for Dehydration, improved after IVF, Forehead laceration, healing,  Traumatic rhabdomyolysis, resolved, Acute renal failure, unspecified acute renal failure type, improved upon IVF. 04/13/15 CT head Cervical spine maxillofacial no acute findings.    Review of Systems:  Review of Systems  Constitutional: Positive for malaise/fatigue. Negative for fever, chills, weight loss and diaphoresis.  HENT: Positive for hearing loss. Negative for congestion, ear discharge, ear pain, nosebleeds and sore throat.   Eyes: Negative for pain, discharge and redness.  Respiratory: Negative for cough, hemoptysis, sputum production and shortness of breath.   Cardiovascular: Negative for chest pain, palpitations, orthopnea, claudication and leg swelling.  Gastrointestinal: Negative for heartburn, nausea, vomiting, abdominal pain, diarrhea and constipation.  Genitourinary: Negative for dysuria and urgency.  Musculoskeletal: Negative for myalgias, back pain and neck pain.  Skin: Negative for itching and rash.       Facial bruise  Neurological: Negative for dizziness, tingling, tremors, sensory change, speech change, focal weakness, weakness and headaches.  Endo/Heme/Allergies: Negative for environmental allergies. Does not bruise/bleed easily.  Psychiatric/Behavioral: Positive for memory loss. Negative for depression. The patient is not  nervous/anxious and does not have insomnia.        04/17/15 MMSE 21/30    Past Medical History  Diagnosis Date  . Hyperlipidemia     takes Lipitor daily  . Adenomatous colon polyp 3/09  . Personal history of prostate cancer   . Dysphagia     with pills  . Hypertension     takes Fosinopril daily  . Sleep apnea     sleep study done in 03/30/04 in epic;doesn't use a CPAP  . Pneumonia     hx of as a child  . Arthritis     back and hip  . Dizziness     pt states when he is hot and stands up too fast  . Back pain     hx of ruptured disc  . Dry skin   . Hemorrhoid     internal   . Hx of colonic polyps   . Nocturia   . Prostate cancer ([place])   . Impaired hearing   . 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 Diabetes mellitus     patient denies Diabetes  . COLONIC POLYPS, HX OF 11/05/2008    Qualifier: Diagnosis of  By: Julaine Hua CMA Deborra Medina), Amanda      Patient Active Problem List   Diagnosis Date Noted  . Dementia with Parkinsonism 04/19/2015  . Clinical depression 04/19/2015  . Hypokalemia 04/19/2015  . Acute renal failure ([place])   . Dehydration   . Acute renal failure (ARF) ([place]) 04/13/2015  . 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 Traumatic hematoma of head 04/13/2015  . Normocytic anemia 04/13/2015  . Hyponatremia 04/13/2015  . Rhabdomyolysis 04/13/2015  . Parkinson's disease ([place]) 04/16/2014  . Constipation due to neurogenic bowel 03/26/2014  . Hypertension 09/05/2013  . Paralysis agitans (  [place]) 08/23/2013  . Orthostatic hypotension 08/23/2013  . Diarrhea 12/28/2012  . 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 Osteoarthritis 01/12/2011  . Hyperlipidemia 11/02/2006  . PROSTATE CANCER, HX OF 11/02/2006    No Known Allergies  Medications: Patient's Medications  New Prescriptions   No medications on file  Previous Medications   ASPIRIN 325 MG TABLET    Take 325 mg by mouth daily.   BACITRACIN OINTMENT    Apply topically 2 (two) times  daily. To any skin abrasion   CARBIDOPA-LEVODOPA (SINEMET IR) 25-100 MG PER TABLET    Take 1.5 tablets by mouth 3 (three) times daily.   COENZYME Q10 (CO Q-10) 100 MG CAPS    Take 100 mg by mouth 3 (three) times daily.   FERROUS SULFATE 325 (65 FE) MG TABLET    Take 325 mg by mouth daily with breakfast.   FLUDROCORTISONE (FLORINEF) 0.1 MG TABLET    TAKE 1 TABLET BY MOUTH EVERY DAY   KLOR-CON M10 10 MEQ TABLET    TAKE 1 TABLET (10 MEQ TOTAL) BY MOUTH DAILY.   MIDODRINE (PROAMATINE) 10 MG TABLET    Take 1 tablet (10 mg total) by mouth 3 (three) times daily.   MULTIPLE VITAMINS-MINERALS (CENTRUM SILVER ULTRA WOMENS PO)    Take 1 tablet by mouth daily.   PAROXETINE (PAXIL) 20 MG TABLET    Take 1 tablet (20 mg total) by mouth daily.   POLYCARBOPHIL (FIBERCON) 625 MG TABLET    Take 625 mg by mouth daily.  Modified Medications   No medications on file  Discontinued Medications   No medications on file    Physical Exam: Filed Vitals:   04/19/15 1250  BP: 138/68  Pulse: 70  Temp: 98.2 F (36.8 C)  TempSrc: Tympanic  Resp: 16   There is no weight on file to calculate BMI.  Physical Exam  Labs reviewed: Basic Metabolic Panel:  Recent Labs  04/13/15 1429 04/14/15 0331 04/15/15 0446 04/19/15  NA 129* 130* 136 130*  K 4.0 3.4* 3.6 3.5  CL 91* 95* 101  --   CO2 25 28 26   --   GLUCOSE 139* 110* 103*  --   BUN 30* 35* 23* 30*  CREATININE 1.97* 1.57* 0.96 1.0  CALCIUM 8.6* 8.3* 8.1*  --     Liver Function Tests:  Recent Labs  05/31/14 1156 04/19/15  AST 24 26  ALT 14 21  ALKPHOS 48 67  BILITOT 0.5  --   PROT 6.2  --   ALBUMIN 4.0  --     CBC:  Recent Labs  05/31/14 1156 04/08/15 1123 04/13/15 1429 04/14/15 0331 04/19/15  WBC 8.0 7.7 12.5* 9.5 7.8  NEUTROABS 6.2 5.9 11.1*  --   --   HGB 12.6* 12.7* 10.2* 8.4* 9.4*  HCT 38.7* 38.0* 29.7* 24.6* 28*  MCV 97.6 94.2 91.1 90.8  --   PLT 238.0 281.0 221 220 324    Lab Results  Component Value Date   TSH 3.19  04/19/2015   Lab Results  Component Value Date   HGBA1C 6.2 05/30/2012   Lab Results  Component Value Date   CHOL 155 05/30/2012   HDL 68.90 05/30/2012   LDLCALC 75 05/30/2012   TRIG 57.0 05/30/2012   CHOLHDL 2 05/30/2012    Significant Diagnostic Results since last visit: none  Patient Care Team: Marin Olp, MD as PCP - General (Family Medicine)  Assessment/Plan Problem List Items Addressed This Visit    Clinical depression  Flat affect noted, continue Paxil 20mg  daily.       Constipation due to neurogenic bowel    Stable, continue fibercon daily.       Dementia with Parkinsonism - Primary    04/17/15 MMSE 21/30, may consider memory preserving meds in the future.       Hypokalemia    04/18/15 K 3.5, continue Kcl 58meq daily.       Hyponatremia    Chronic, Na 129 04/18/15, observe.       Normocytic anemia    04/19/15 Hgb 9.6, Vit B12 806, folate 17.8 continue FE 325mg  daily.       Orthostatic hypotension    Florinef 0.1mg  BID, Proamatine 10mg  tid, Bp lying 136/68, standing 71/44.        Parkinson's disease ([place])    Gait instability, falling, continue Sinemet 25/100 1.5 tab tid.       Traumatic hematoma of head    Healing facial contusion.           Family/ staff Communication: risk for falling.   Labs/tests ordered: CBC, CMP, TSH, Vit 12, Folate, CXR done [facility]  [facility] Matea Stanard NP [facility] [address], [place] [address] On Call:  [phone] & follow prompts after 5pm & weekends Office Phone:  [phone] Office Fax:  [phone]

## 2015-04-21 ENCOUNTER — Emergency Department (HOSPITAL_COMMUNITY): Payer: Medicare Other

## 2015-04-21 ENCOUNTER — Emergency Department (HOSPITAL_COMMUNITY)
Admission: EM | Admit: 2015-04-21 | Discharge: 2015-04-22 | Disposition: A | Discharge status: Home / Self Care | Payer: Medicare Other | Attending: Emergency Medicine | Admitting: Emergency Medicine

## 2015-04-21 ENCOUNTER — Encounter (HOSPITAL_COMMUNITY): Payer: Self-pay | Admitting: *Deleted

## 2015-04-21 DIAGNOSIS — E119 Type 2 diabetes mellitus without complications: Secondary | ICD-10-CM | POA: Insufficient documentation

## 2015-04-21 DIAGNOSIS — W01198A Fall on same level from slipping, tripping and stumbling with subsequent striking against other object, initial encounter: Secondary | ICD-10-CM | POA: Insufficient documentation

## 2015-04-21 DIAGNOSIS — Y9389 Activity, other specified: Secondary | ICD-10-CM | POA: Diagnosis not present

## 2015-04-21 DIAGNOSIS — Z87891 Personal history of nicotine dependence: Secondary | ICD-10-CM | POA: Diagnosis not present

## 2015-04-21 DIAGNOSIS — Y9289 Other specified places as the place of occurrence of the external cause: Secondary | ICD-10-CM | POA: Insufficient documentation

## 2015-04-21 DIAGNOSIS — Y998 Other external cause status: Secondary | ICD-10-CM | POA: Insufficient documentation

## 2015-04-21 DIAGNOSIS — E785 Hyperlipidemia, unspecified: Secondary | ICD-10-CM | POA: Diagnosis not present

## 2015-04-21 DIAGNOSIS — Z8701 Personal history of pneumonia (recurrent): Secondary | ICD-10-CM | POA: Insufficient documentation

## 2015-04-21 DIAGNOSIS — M1990 Unspecified osteoarthritis, unspecified site: Secondary | ICD-10-CM | POA: Diagnosis not present

## 2015-04-21 DIAGNOSIS — R42 Dizziness and giddiness: Secondary | ICD-10-CM | POA: Insufficient documentation

## 2015-04-21 DIAGNOSIS — Z86010 Personal history of colonic polyps: Secondary | ICD-10-CM | POA: Diagnosis not present

## 2015-04-21 DIAGNOSIS — S0990XA Unspecified injury of head, initial encounter: Secondary | ICD-10-CM | POA: Diagnosis present

## 2015-04-21 DIAGNOSIS — S0100XA Unspecified open wound of scalp, initial encounter: Secondary | ICD-10-CM | POA: Insufficient documentation

## 2015-04-21 DIAGNOSIS — W19XXXA Unspecified fall, initial encounter: Secondary | ICD-10-CM

## 2015-04-21 DIAGNOSIS — Z8546 Personal history of malignant neoplasm of prostate: Secondary | ICD-10-CM | POA: Diagnosis not present

## 2015-04-21 DIAGNOSIS — Z7982 Long term (current) use of aspirin: Secondary | ICD-10-CM | POA: Diagnosis not present

## 2015-04-21 DIAGNOSIS — I10 Essential (primary) hypertension: Secondary | ICD-10-CM | POA: Insufficient documentation

## 2015-04-21 DIAGNOSIS — Z79899 Other long term (current) drug therapy: Secondary | ICD-10-CM | POA: Insufficient documentation

## 2015-04-21 DIAGNOSIS — S0101XA Laceration without foreign body of scalp, initial encounter: Secondary | ICD-10-CM | POA: Diagnosis not present

## 2015-04-21 LAB — CBC WITH DIFFERENTIAL/PLATELET
BASOS ABS: 0 10*3/uL (ref 0.0–0.1)
Basophils Relative: 0 %
Eosinophils Absolute: 0.2 10*3/uL (ref 0.0–0.7)
Eosinophils Relative: 3 %
HEMATOCRIT: 29.4 % — AB (ref 39.0–52.0)
Hemoglobin: 10.1 g/dL — ABNORMAL LOW (ref 13.0–17.0)
LYMPHS PCT: 13 %
Lymphs Abs: 1.1 10*3/uL (ref 0.7–4.0)
MCH: 31.9 pg (ref 26.0–34.0)
MCHC: 34.4 g/dL (ref 30.0–36.0)
MCV: 92.7 fL (ref 78.0–100.0)
Monocytes Absolute: 0.9 10*3/uL (ref 0.1–1.0)
Monocytes Relative: 10 %
NEUTROS ABS: 6.3 10*3/uL (ref 1.7–7.7)
Neutrophils Relative %: 74 %
PLATELETS: 335 10*3/uL (ref 150–400)
RBC: 3.17 MIL/uL — AB (ref 4.22–5.81)
RDW: 13.7 % (ref 11.5–15.5)
WBC: 8.5 10*3/uL (ref 4.0–10.5)

## 2015-04-21 MED ORDER — LIDOCAINE HCL (PF) 1 % IJ SOLN
5.0000 mL | Freq: Once | INTRAMUSCULAR | Status: AC
  Administered 2015-04-21: 5 mL
  Filled 2015-04-21: qty 5

## 2015-04-21 NOTE — ED Notes (Signed)
Pt from [facility] and he has been dizzy and falling for last several days.  Pt alert but confused.

## 2015-04-21 NOTE — ED Provider Notes (Signed)
CSN: [id number]     Arrival date & time 04/21/15  2153 History   First MD Initiated Contact with Patient 04/21/15 2217     Chief Complaint  Patient presents with  . Fall    HPI   Darius Castro is a 81 y.o. male with a PMH of HLD, HTN, DM, prostate cancer, Parkinson's Disease who presents to the ED s/p fall. Patient is a poor historian, however he states he fell earlier today and hit the back of his head. He denies loss of consciousness. He denies headache, lightheadedness. He reports intermittent dizziness. He denies vision changes, chest pain, shortness of breath, abdominal pain, nausea, vomiting, diarrhea, constipation, numbness, weakness, paresthesia. Per report, patient has been dizzy and has had multiple falls over the past several days. Patient was evaluated in the ED for the same symptoms 10/22 and was subsequently discharged 10/25 to a SNF.   Past Medical History  Diagnosis Date  . Hyperlipidemia     takes Lipitor daily  . Adenomatous colon polyp 3/09  . Personal history of prostate cancer   . Dysphagia     with pills  . Hypertension     takes Fosinopril daily  . Sleep apnea     sleep study done in 03/30/04 in epic;doesn't use a CPAP  . Pneumonia     hx of as a child  . Arthritis     back and hip  . Dizziness     pt states when he is hot and stands up too fast  . Back pain     hx of ruptured disc  . Dry skin   . Hemorrhoid     internal   . Hx of colonic polyps   . Nocturia   . Prostate cancer ([place])   . Impaired hearing   . 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 Diabetes mellitus     patient denies Diabetes  . COLONIC POLYPS, HX OF 11/05/2008    Qualifier: Diagnosis of  By: Julaine Hua CMA (AAMA), Estill Bamberg     Past Surgical History  Procedure Laterality Date  . Lumbar laminectomy  1998  . Knee arthroscopy  2001  . Rotator cuff repair Right 2/10  . Cataract extraction Bilateral   . Tonsillectomy      as a child  . Colonoscopy    . Radioactive seed  implant  2008  . Total hip arthroplasty  08/24/2011    Procedure: TOTAL HIP ARTHROPLASTY;  Surgeon: Lorn Junes, MD;  Location: [place];  Service: Orthopedics;  Laterality: Right;  DR [place]  . Zenker's diverticulectomy  09/02/2011    Procedure: ZENKER'S DIVERTICULECTOMY;  Surgeon: Melida Quitter, MD;  Location: [facility] OR;  Service: ENT;  Laterality: N/A;   Family History  Problem Relation Age of Onset  . Heart disease Father   . Heart attack Father 50  . Colon polyps Daughter   . Colon cancer Neg Hx   . Anesthesia problems Neg Hx   . Hypotension Neg Hx   . Malignant hyperthermia Neg Hx   . Pseudochol deficiency Neg Hx   . Hypertension Mother   . Cancer Sister    Social History  Substance Use Topics  . Smoking status: Former Smoker    Quit date: 06/22/1961  . Smokeless tobacco: Never Used  . Alcohol Use: Yes     Comment: red wine 3-4 times/wk      Review of Systems  Constitutional: Negative for fever and chills.  Eyes: Negative for visual disturbance.  Respiratory: Negative for shortness of breath.   Cardiovascular: Negative for chest pain.  Gastrointestinal: Negative for nausea, vomiting, abdominal pain, diarrhea and constipation.  Genitourinary: Negative for dysuria, urgency and frequency.  Musculoskeletal: Negative for myalgias, back pain, arthralgias, neck pain and neck stiffness.  Skin: Positive for wound.  Neurological: Positive for dizziness. Negative for syncope, weakness, light-headedness, numbness and headaches.  All other systems reviewed and are negative.     Allergies  Review of patient's allergies indicates no known allergies.  Home Medications   Prior to Admission medications   Medication Sig Start Date End Date Taking? Authorizing Provider  aspirin 325 MG tablet Take 325 mg by mouth daily.   Yes Historical Provider, MD  carbidopa-levodopa (SINEMET IR) 25-100 MG per tablet Take 1.5 tablets by mouth 3 (three) times daily. 02/01/15   Yes Rebecca S Tat, DO  Coenzyme Q10 (CO Q-10) 100 MG CAPS Take 100 mg by mouth 3 (three) times daily.   Yes Historical Provider, MD  ferrous sulfate 325 (65 FE) MG tablet Take 325 mg by mouth daily with breakfast.   Yes Historical Provider, MD  fludrocortisone (FLORINEF) 0.1 MG tablet TAKE 1 TABLET BY MOUTH EVERY DAY 03/05/15  Yes Dorothy Spark, MD  KLOR-CON M10 10 MEQ tablet TAKE 1 TABLET (10 MEQ TOTAL) BY MOUTH DAILY. 10/15/14  Yes Dorothy Spark, MD  midodrine (PROAMATINE) 10 MG tablet Take 1 tablet (10 mg total) by mouth 3 (three) times daily. 09/24/14  Yes Dorothy Spark, MD  Multiple Vitamins-Minerals (CENTRUM SILVER ULTRA WOMENS PO) Take 1 tablet by mouth daily.   Yes Historical Provider, MD  PARoxetine (PAXIL) 20 MG tablet Take 1 tablet (20 mg total) by mouth daily. 04/08/15  Yes Kennyth Arnold, FNP  polycarbophil (FIBERCON) 625 MG tablet Take 625 mg by mouth daily.   Yes Historical Provider, MD  bacitracin ointment Apply topically 2 (two) times daily. To any skin abrasion Patient not taking: Reported on 04/21/2015 04/16/15   Thurnell Lose, MD    BP 158/63 mmHg  Pulse 67  Temp(Src) 98 F (36.7 C) (Oral)  Resp 19  SpO2 93% Physical Exam  Constitutional: He is oriented to person, place, and time. He appears well-developed and well-nourished. No distress.  HENT:  Head: Normocephalic. Head is with laceration. Head is without contusion.  Right Ear: External ear normal.  Left Ear: External ear normal.  Nose: Nose normal.  Mouth/Throat: Uvula is midline, oropharynx is clear and moist and mucous membranes are normal.  Healing periorbital ecchymosis and forehead laceration. 1 cm laceration to posterior aspect of head, hemostatic. No significant tenderness to palpation. No palpable deformity.  Eyes: Conjunctivae, EOM and lids are normal. Pupils are equal, round, and reactive to light. Right eye exhibits no discharge. Left eye exhibits no discharge. No scleral icterus.  Neck:  Normal range of motion. Neck supple. No spinous process tenderness and no muscular tenderness present.  Cardiovascular: Normal rate, regular rhythm, normal heart sounds, intact distal pulses and normal pulses.   Pulmonary/Chest: Effort normal and breath sounds normal. No respiratory distress. He has no wheezes. He has no rales. He exhibits no tenderness.  Abdominal: Soft. Normal appearance and bowel sounds are normal. He exhibits no distension and no mass. There is no tenderness. There is no rigidity, no rebound and no guarding.  Musculoskeletal: Normal range of motion. He exhibits no edema or tenderness.  Joints supple. Compartments soft. Pelvis stable. No TTP of extremities. Patient  moves all extremities without difficulty.  Neurological: He is alert and oriented to person, place, and time. He has normal strength. No cranial nerve deficit or sensory deficit. Coordination normal.  Skin: Skin is warm, dry and intact. No rash noted. He is not diaphoretic. No erythema. No pallor.  Psychiatric: He has a normal mood and affect. His speech is normal and behavior is normal.  Nursing note and vitals reviewed.   ED Course  .Marland KitchenLaceration Repair Date/Time: 04/21/2015 11:30 PM Performed by: Marella Chimes Authorized by: Bernerd Limbo C Consent: Verbal consent obtained. Risks and benefits: risks, benefits and alternatives were discussed Consent given by: patient Patient understanding: patient states understanding of the procedure being performed Patient consent: the patient's understanding of the procedure matches consent given Procedure consent: procedure consent matches procedure scheduled Relevant documents: relevant documents present and verified Site marked: the operative site was marked Required items: required blood products, implants, devices, and special equipment available Patient identity confirmed: verbally with patient Time out: Immediately prior to procedure a "time out" was  called to verify the correct patient, procedure, equipment, support staff and site/side marked as required. Body area: head/neck Location details: scalp Laceration length: 1 cm Foreign bodies: no foreign bodies Tendon involvement: none Nerve involvement: none Vascular damage: no Patient sedated: no Irrigation solution: wound cleanser. Amount of cleaning: standard Debridement: none Degree of undermining: none Skin closure: staples (1 staple) Approximation: close Approximation difficulty: simple Patient tolerance: Patient tolerated the procedure well with no immediate complications   Labs Review Labs Reviewed  CBC WITH DIFFERENTIAL/PLATELET - Abnormal; Notable for the following:    RBC 3.17 (*)    Hemoglobin 10.1 (*)    HCT 29.4 (*)    All other components within normal limits  COMPREHENSIVE METABOLIC PANEL - Abnormal; Notable for the following:    Potassium 3.4 (*)    Chloride 94 (*)    CO2 33 (*)    Glucose, Bld 109 (*)    BUN 23 (*)    Calcium 8.8 (*)    ALT 12 (*)    All other components within normal limits  URINALYSIS, ROUTINE W REFLEX MICROSCOPIC (NOT AT [facility]) - Abnormal; Notable for the following:    Color, Urine STRAW (*)    All other components within normal limits    Imaging Review Ct Head Wo Contrast  04/22/2015  CLINICAL DATA:  Dizziness and falls for past few days. History of diabetes, hypertension, prostate cancer. EXAM: CT HEAD WITHOUT CONTRAST TECHNIQUE: Contiguous axial images were obtained from the base of the skull through the vertex without intravenous contrast. COMPARISON:  CT head and cervical spine April 13, 2015 FINDINGS: Moderate to severe ventriculomegaly, predominately on the basis of global parenchymal brain volume loss, stable from prior imaging. Mild sulcal effacement of the convexities. Patchy to confluent supratentorial white matter hypodensities are unchanged. No midline shift, mass effect, mass lesions or acute large vascular territory  infarct. No abnormal extra-axial fluid collections. Basal cisterns are patent. Mild to moderate calcific atherosclerosis of the carotid siphons. Small residual LEFT frontal scalp hematoma and suspected laceration. Small RIGHT parietal scalp hematoma, subcutaneous gas and skin staples. No skull fracture. Calcified pannus about the odontoid process most often seen with CPPD. The included ocular globes and orbital contents are non-suspicious. Status post bilateral ocular lens implants. Mild LEFT maxillary sinus mucosal thickening without air-fluid levels. Mastoid air cells are well aerated. IMPRESSION: No acute intracranial process. Multiple small scalp hematomas. No skull fracture. 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 Electronically Signed   By: Elon Alas M.D.   On: 04/22/2015 01:49     I have personally reviewed and evaluated these images and lab results as part of my medical decision-making.   EKG Interpretation None      MDM   Final diagnoses:  Fall    81 year old male presents with a fall, which occurred earlier this evening. He states he hit the back of his head. Denies LOC, headache, lightheadedness. Reports intermittent dizziness. Denies vision changes, chest pain, shortness of breath, abdominal pain, nausea, vomiting, diarrhea, constipation, numbness, weakness, paresthesia. Patient was evaluated in the ED for the same symptoms 10/22 and was subsequently discharged 10/25 to a SNF. Symptoms at that time thought to be related to autonomic dysfunction due to Parkinson's Disease.  Spoke with nurse at [facility], who states the patient fell this evening around 9 PM and hit his head on a table, but has otherwise been his normal self. Patient is afebrile. Vital signs stable. Recheck of blood pressure in 829H systolic after appropriate size cuff placed. Patient is alert and oriented. 1 cm  laceration to posterior aspect of head. No palpable deformity. No significant edema. No tenderness to palpation. Heart regular rate and rhythm. Lungs clear to auscultation bilaterally. Abdomen soft, nontender, nondistended. Normal neuro exam with no focal deficit. Joints supple. Compartments soft. No tenderness to palpation of extremities. Patient moves all extremities without difficulty. Strength and sensation intact. Distal pulses intact.  Laceration cleaned and repaired in the ED with 1 staple. CBC negative for leukocytosis, hemoglobin 10.1, which appears stable. CMP remarkable for potassium 3.4, repleted in the ED, chloride 94. UA negative for infection. Will obtain head CT.  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 Patient is well-appearing, repeat BP 158/63. Feel patient is stable for discharge at this time. Patient to follow up with PCP. Return precautions discussed.  Patient discussed with and seen by Dr. Ralene Bathe.  BP 158/63 mmHg  Pulse 67  Temp(Src) 98 F (36.7 C) (Oral)  Resp 19  SpO2 93%    Marella Chimes, PA-C 04/22/15 [id number]  Quintella Reichert, MD 04/22/15 [phone]

## 2015-04-21 NOTE — ED Notes (Signed)
Bed: WA02 Expected date:  Expected time:  Means of arrival:  Comments: EMS 81yo Fall

## 2015-04-22 ENCOUNTER — Encounter: Payer: Self-pay | Admitting: Internal Medicine

## 2015-04-22 ENCOUNTER — Ambulatory Visit: Payer: Medicare Other | Admitting: Family Medicine

## 2015-04-22 DIAGNOSIS — R2681 Unsteadiness on feet: Secondary | ICD-10-CM | POA: Insufficient documentation

## 2015-04-22 DIAGNOSIS — R1310 Dysphagia, unspecified: Secondary | ICD-10-CM

## 2015-04-22 DIAGNOSIS — R410 Disorientation, unspecified: Secondary | ICD-10-CM | POA: Insufficient documentation

## 2015-04-22 DIAGNOSIS — G319 Degenerative disease of nervous system, unspecified: Secondary | ICD-10-CM | POA: Insufficient documentation

## 2015-04-22 DIAGNOSIS — G4733 Obstructive sleep apnea (adult) (pediatric): Secondary | ICD-10-CM

## 2015-04-22 DIAGNOSIS — J439 Emphysema, unspecified: Secondary | ICD-10-CM | POA: Insufficient documentation

## 2015-04-22 DIAGNOSIS — E785 Hyperlipidemia, unspecified: Secondary | ICD-10-CM

## 2015-04-22 DIAGNOSIS — H9190 Unspecified hearing loss, unspecified ear: Secondary | ICD-10-CM

## 2015-04-22 DIAGNOSIS — I679 Cerebrovascular disease, unspecified: Secondary | ICD-10-CM | POA: Insufficient documentation

## 2015-04-22 DIAGNOSIS — S0101XA Laceration without foreign body of scalp, initial encounter: Secondary | ICD-10-CM | POA: Diagnosis not present

## 2015-04-22 HISTORY — DX: Unspecified hearing loss, unspecified ear: H91.90

## 2015-04-22 HISTORY — DX: Unsteadiness on feet: R26.81

## 2015-04-22 HISTORY — DX: Dysphagia, unspecified: R13.10

## 2015-04-22 HISTORY — DX: Obstructive sleep apnea (adult) (pediatric): G47.33

## 2015-04-22 HISTORY — DX: Hyperlipidemia, unspecified: E78.5

## 2015-04-22 LAB — URINALYSIS, ROUTINE W REFLEX MICROSCOPIC
BILIRUBIN URINE: NEGATIVE
Glucose, UA: NEGATIVE mg/dL
Hgb urine dipstick: NEGATIVE
KETONES UR: NEGATIVE mg/dL
Leukocytes, UA: NEGATIVE
NITRITE: NEGATIVE
PROTEIN: NEGATIVE mg/dL
Specific Gravity, Urine: 1.008 (ref 1.005–1.030)
UROBILINOGEN UA: 0.2 mg/dL (ref 0.0–1.0)
pH: 7 (ref 5.0–8.0)

## 2015-04-22 LAB — COMPREHENSIVE METABOLIC PANEL
ALK PHOS: 76 U/L (ref 38–126)
ALT: 12 U/L — ABNORMAL LOW (ref 17–63)
ANION GAP: 9 (ref 5–15)
AST: 29 U/L (ref 15–41)
Albumin: 3.9 g/dL (ref 3.5–5.0)
BILIRUBIN TOTAL: 0.5 mg/dL (ref 0.3–1.2)
BUN: 23 mg/dL — ABNORMAL HIGH (ref 6–20)
CALCIUM: 8.8 mg/dL — AB (ref 8.9–10.3)
CO2: 33 mmol/L — ABNORMAL HIGH (ref 22–32)
Chloride: 94 mmol/L — ABNORMAL LOW (ref 101–111)
Creatinine, Ser: 0.97 mg/dL (ref 0.61–1.24)
GFR calc non Af Amer: >60 mL/min (ref >60)
Glucose, Bld: 109 mg/dL — ABNORMAL HIGH (ref 65–99)
POTASSIUM: 3.4 mmol/L — AB (ref 3.5–5.1)
Sodium: 136 mmol/L (ref 135–145)
TOTAL PROTEIN: 6.5 g/dL (ref 6.5–8.1)

## 2015-04-22 MED ORDER — POTASSIUM CHLORIDE CRYS ER 20 MEQ PO TBCR
40.0000 meq | EXTENDED_RELEASE_TABLET | Freq: Once | ORAL | Status: AC
  Administered 2015-04-22: 40 meq via ORAL
  Filled 2015-04-22: qty 2

## 2015-04-22 NOTE — ED Notes (Signed)
Pt resting.  Safety sitter at bedside.

## 2015-04-22 NOTE — ED Provider Notes (Signed)
Patient was seen and evaluated for fall by Verdis Prime, PA-C, and set for discharge back to [facility] where he reportedly lives in an independent living apartment. The patient has an increased number of falls and is considered a fall risk. Currently, he seems disoriented, confused, ?sundowning as he was A&O on arrival. Will plan to keep him in the ED until social work can become involved regarding moving him to assisted living.  Nursing received a phone call at 5:30 am from [facility] advising that the patient no longer lives in independent care and has been moved into skilled nursing. He can be discharged back to the facility to continue care there. No social work consult required.  Charlann Lange, PA-C 04/22/15 [place], MD 04/23/15 [phone]

## 2015-04-22 NOTE — Progress Notes (Signed)
Patient ID: Darius Castro, male   DOB: July 30, 1923, 81 y.o.   MRN: [id number]    HISTORY AND PHYSICAL  Location:  [facility] Room Number: N31 Place of Service: SNF [phone])   Extended Emergency Contact Information Primary Emergency Contact: Kondo,Catherine Address: [address]          [place], [place] [address] [place] of [place] Phone: [phone] Relation: Spouse Secondary Emergency Contact: Cooper,Beverly Address: [address]          [place], [place] [address] [place] Phone: [phone] Mobile Phone: [phone] Relation: Daughter  Advanced Directive information Does patient have an advance directive?: Yes, Type of Advance Directive: Living will;Healthcare Power of [place];Out of facility DNR (pink MOST or yellow form), Pre-existing out of facility DNR order (yellow form or pink MOST form): Yellow form placed in chart (order not valid for inpatient use) (At [facility]), Does patient want to make changes to advanced directive?: No - Patient declined  Chief Complaint  Patient presents with  . New Admit To SNF    Following hospitalization    HPI:  Patient was admitted to skilled nursing facility 04/16/2015 following a hospitalization from 04/13/2015 through 04/16/2015.  Active diagnoses during hospitalization included dehydration with creatinine 1.9, a fall prior to hospitalization, laceration and hematoma of the forehead, traumatic rhabdomyolysis, and acute renal failure.  There are multiple other diagnoses as documented below.  During the hospitalization, the patient gradually improved enough to allow transfer to skilled nursing facility for further strengthening and rehabilitation.  CT of the head confirmed the forehead hematoma, cerebral atrophy, and small vessel disease.  This patient is been subject to multiple falls. These are attributable in part to Parkinson's disease, autonomic postural hypotension,  increasing dementia, impulsive behaviors, and his unstable gait which may be related to both the Parkinson's and his cerebrovascular disease with atrophy and small vessel disease.  History suggests a gradual loss and cognitive abilities with increasing memory deficits.  He is admitted to the skilled nursing facility for continued observation and rehabilitative therapies to include physical therapy for strengthening and gait improvement. He will receive occupational therapy.  Past Medical History  Diagnosis Date  . Hyperlipidemia     takes Lipitor daily  . Adenomatous colon polyp 3/09  . Personal history of prostate cancer   . Dysphagia     with pills  . Hypertension     takes Fosinopril daily  . Sleep apnea     sleep study done in 03/30/04 in epic;doesn't use a CPAP  . Pneumonia     hx of as a child  . Arthritis     back and hip  . Dizziness     pt states when he is hot and stands up too fast  . Back pain     hx of ruptured disc  . Dry skin   . Hemorrhoid     internal   . Hx of colonic polyps   . Nocturia   . Prostate cancer ([place])   . Impaired hearing   . 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 Diabetes mellitus     patient denies Diabetes  . COLONIC POLYPS, HX OF 11/05/2008    Qualifier: Diagnosis of  By: Julaine Hua CMA (Eugenio Saenz), Estill Bamberg    . History of fall 04/08/2015  . Dyslipidemia 04/22/2015  . Dysphagia 04/22/2015  . Obstructive sleep apnea 04/22/2015  . HOH (hard of hearing) 04/22/2015  . Unstable gait  04/22/2015    Past Surgical History  Procedure Laterality Date  . Lumbar laminectomy  1998  . Knee arthroscopy  2001  . Rotator cuff repair Right 2/10  . Cataract extraction Bilateral   . Tonsillectomy      as a child  . Colonoscopy    . Radioactive seed implant  2008  . Total hip arthroplasty  08/24/2011    Procedure: TOTAL HIP ARTHROPLASTY;  Surgeon: Robert A Wainer, MD;  Location: [facility] OR;  Service: Orthopedics;  Laterality: Right;  DR WAINER WANTS 90  MINUTES FOR SURGERY  . Zenker's diverticulectomy  09/02/2011    Procedure: ZENKER'S DIVERTICULECTOMY;  Surgeon: Dwight Bates, MD;  Location: [facility] OR;  Service: ENT;  Laterality: N/A;    Patient Care Team: Stephen O Hunter, MD as PCP - General (Family Medicine) Rebecca S Tat, DO as Consulting Physician (Neurology) Malcolm T Stark, MD as Consulting Physician (Gastroenterology) Robert Wainer, MD as Consulting Physician (Orthopedic Surgery) Dwight Bates, MD as Consulting Physician (Otolaryngology)  Social History   Social History  . Marital Status: Married    Spouse Name: N/A  . Number of Children: 3  . Years of Education: N/A   Occupational History  . retired    Social History Main Topics  . Smoking status: Former Smoker    Quit date: 06/22/1961  . Smokeless tobacco: Never Used  . Alcohol Use: Yes     Comment: red wine 3-4 times/wk  . Drug Use: No  . Sexual Activity: Not Currently   Other Topics Concern  . Not on file   Social History Narrative    reports that he quit smoking about 53 years ago. He has never used smokeless tobacco. He reports that he drinks alcohol. He reports that he does not use illicit drugs.  Family History  Problem Relation Age of Onset  . Heart disease Father   . Heart attack Father 57  . Colon polyps Daughter   . Colon cancer Neg Hx   . Anesthesia problems Neg Hx   . Hypotension Neg Hx   . Malignant hyperthermia Neg Hx   . Pseudochol deficiency Neg Hx   . Hypertension Mother   . Cancer Sister    Family Status  Relation Status Death Age  . Father Deceased 57  . Mother Deceased 95    old age  . Sister Deceased 82    Immunization History  Administered Date(s) Administered  . Influenza Split 03/25/2011, 03/07/2012  . Influenza Whole 04/01/2007, 04/03/2008, 03/29/2009, 03/21/2010  . Influenza,inj,Quad PF,36+ Mos 03/07/2013, 03/26/2014, 04/15/2015  . Pneumococcal Polysaccharide-23 02/05/2003, 08/29/2009  . Td 08/29/2009  . Zoster  01/28/2006    No Known Allergies  Medications: Patient's Medications  New Prescriptions   No medications on file  Previous Medications   ASPIRIN 325 MG TABLET    Take 325 mg by mouth daily.   BACITRACIN OINTMENT    Apply topically 2 (two) times daily. To any skin abrasion   CARBIDOPA-LEVODOPA (SINEMET IR) 25-100 MG PER TABLET    Take 1.5 tablets by mouth 3 (three) times daily.   COENZYME Q10 (CO Q-10) 100 MG CAPS    Take 100 mg by mouth 3 (three) times daily.   FERROUS SULFATE 325 (65 FE) MG TABLET    Take 325 mg by mouth daily with breakfast.   FLUDROCORTISONE (FLORINEF) 0.1 MG TABLET    TAKE 1 TABLET BY MOUTH EVERY DAY   KLOR-CON M10 10 MEQ TABLET    TAKE 1 TABLET (10   MEQ TOTAL) BY MOUTH DAILY.   MIDODRINE (PROAMATINE) 10 MG TABLET    Take 1 tablet (10 mg total) by mouth 3 (three) times daily.   MULTIPLE VITAMINS-MINERALS (CENTRUM SILVER ULTRA WOMENS PO)    Take 1 tablet by mouth daily.   PAROXETINE (PAXIL) 20 MG TABLET    Take 1 tablet (20 mg total) by mouth daily.   POLYCARBOPHIL (FIBERCON) 625 MG TABLET    Take 625 mg by mouth daily.  Modified Medications   No medications on file  Discontinued Medications   No medications on file    Review of Systems  Constitutional: Positive for fatigue. Negative for fever, activity change, appetite change and unexpected weight change.  HENT: Positive for hearing loss. Negative for congestion, ear pain, rhinorrhea, sore throat, tinnitus, trouble swallowing and voice change.   Eyes:       Corrective lenses  Respiratory: Negative for cough, choking, chest tightness, shortness of breath and wheezing.   Cardiovascular: Negative for chest pain, palpitations and leg swelling.  Gastrointestinal: Negative for nausea, abdominal pain, diarrhea, constipation and abdominal distention.  Endocrine: Negative for cold intolerance, heat intolerance, polydipsia, polyphagia and polyuria.  Genitourinary: Negative for dysuria, urgency, frequency and testicular  pain.       Not incontinent  Musculoskeletal: Negative for myalgias, back pain, arthralgias, gait problem and neck pain.  Skin: Negative for color change, pallor and rash.       Hematoma of the forehead. Facial bruising.  Allergic/Immunologic: Negative.   Neurological: Negative for dizziness, tremors, syncope, speech difficulty, weakness, numbness and headaches.       History of Parkinson's disease and dementia  Hematological: Negative for adenopathy. Does not bruise/bleed easily.       History of anemia  Psychiatric/Behavioral: Positive for confusion. Negative for hallucinations, behavioral problems, sleep disturbance and decreased concentration. The patient is not nervous/anxious.        Memory loss    Filed Vitals:   04/18/15 1853  BP: 136/68  Pulse: 70  Temp: 98.6 F (37 C)  Resp: 16  Height: 5' 8" (1.727 m)  Weight: 143 lb (64.864 kg)  SpO2: 97%   Body mass index is 21.75 kg/(m^2).  Physical Exam  Constitutional: He is oriented to person, place, and time. He appears well-developed and well-nourished. No distress.  HENT:  Right Ear: External ear normal.  Left Ear: External ear normal.  Nose: Nose normal.  Mouth/Throat: Oropharynx is clear and moist. No oropharyngeal exudate.  Eyes: Conjunctivae and EOM are normal. Pupils are equal, round, and reactive to light.  Neck: No JVD present. No tracheal deviation present. No thyromegaly present.  Cardiovascular: Normal rate, regular rhythm, normal heart sounds and intact distal pulses.  Exam reveals no gallop and no friction rub.   No murmur heard. Pulmonary/Chest: No respiratory distress. He has no wheezes. He has no rales. He exhibits no tenderness.  Abdominal: He exhibits no distension and no mass. There is no tenderness.  Musculoskeletal: Normal range of motion. He exhibits edema. He exhibits no tenderness.  Wearing compression stockings bilaterally. Unable to stand.  Lymphadenopathy:    He has no cervical adenopathy.    Neurological: He is alert and oriented to person, place, and time. He has normal reflexes. No cranial nerve deficit. Coordination normal.  Cognitive deficits. Groggy. Difficult to get him to participate during exam. Fluctuating levels of alertness per staff. Possible delirium.  Skin: No rash noted. No erythema. No pallor.  Forehead hematoma and laceration. Periorbital ecchymoses bilaterally. Bruise of  the left arm.  Psychiatric: He has a normal mood and affect. His behavior is normal. Judgment and thought content normal.    Labs reviewed: Lab Summary Latest Ref Rng 04/21/2015 04/19/2015 04/15/2015 04/14/2015 04/13/2015  Hemoglobin 13.0 - 17.0 g/dL 10.1(L) 9.4(A) (None) 8.4(L) 10.2(L)  Hematocrit 39.0 - 52.0 % 29.4(L) 28(A) (None) 24.6(L) 29.7(L)  White count 4.0 - 10.5 K/uL 8.5 7.8 (None) 9.5 12.5(H)  Platelet count 150 - 400 K/uL 335 324 (None) 220 221  Sodium 135 - 145 mmol/L 136 130(A) 136 130(L) 129(L)  Potassium 3.5 - 5.1 mmol/L 3.4(L) 3.5 3.6 3.4(L) 4.0  Calcium 8.9 - 10.3 mg/dL 8.8(L) (None) 8.1(L) 8.3(L) 8.6(L)  Phosphorus - (None) (None) (None) (None) (None)  Creatinine 0.61 - 1.24 mg/dL 0.97 1.0 0.96 1.57(H) 1.97(H)  AST 15 - 41 U/L 29 26 (None) (None) (None)  Alk Phos 38 - 126 U/L 76 67 (None) (None) (None)  Bilirubin 0.3 - 1.2 mg/dL 0.5 (None) (None) (None) (None)  Glucose 65 - 99 mg/dL 109(H) 111 103(H) 110(H) 139(H)  Cholesterol - (None) (None) (None) (None) (None)  HDL cholesterol - (None) (None) (None) (None) (None)  Triglycerides - (None) (None) (None) (None) (None)  LDL Direct - (None) (None) (None) (None) (None)  LDL Calc - (None) (None) (None) (None) (None)  Total protein 6.5 - 8.1 g/dL 6.5 (None) (None) (None) (None)  Albumin 3.5 - 5.0 g/dL 3.9 (None) (None) (None) (None)   Lab Results  Component Value Date   BUN 23* 04/21/2015   Lab Results  Component Value Date   HGBA1C 6.2 05/30/2012   Lab Results  Component Value Date   TSH 3.19 04/19/2015           Ct Head Wo Contrast  04/22/2015  CLINICAL DATA:  Dizziness and falls for past few days. History of diabetes, hypertension, prostate cancer. EXAM: CT HEAD WITHOUT CONTRAST TECHNIQUE: Contiguous axial images were obtained from the base of the skull through the vertex without intravenous contrast. COMPARISON:  CT head and cervical spine April 13, 2015 FINDINGS: Moderate to severe ventriculomegaly, predominately on the basis of global parenchymal brain volume loss, stable from prior imaging. Mild sulcal effacement of the convexities. Patchy to confluent supratentorial white matter hypodensities are unchanged. No midline shift, mass effect, mass lesions or acute large vascular territory infarct. No abnormal extra-axial fluid collections. Basal cisterns are patent. Mild to moderate calcific atherosclerosis of the carotid siphons. Small residual LEFT frontal scalp hematoma and suspected laceration. Small RIGHT parietal scalp hematoma, subcutaneous gas and skin staples. No skull fracture. Calcified pannus about the odontoid process most often seen with CPPD. The included ocular globes and orbital contents are non-suspicious. Status post bilateral ocular lens implants. Mild LEFT maxillary sinus mucosal thickening without air-fluid levels. Mastoid air cells are well aerated. IMPRESSION: No acute intracranial process. Multiple small scalp hematomas. No skull fracture. 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 Electronically Signed   By: Courtnay  Bloomer M.D.   On: 04/22/2015 01:49   Ct Head Wo Contrast  04/13/2015  CLINICAL DATA:  Unwitnessed fall in the bathroom this morning, landing face down on the floor. Swelling and bruising around both eyes and nose. Uncertain loss of consciousness. Initial encounter. EXAM: CT HEAD WITHOUT CONTRAST CT MAXILLOFACIAL WITHOUT CONTRAST CT CERVICAL SPINE WITHOUT CONTRAST  TECHNIQUE: Multidetector CT imaging of the head, cervical spine, and maxillofacial structures were performed using the standard protocol without intravenous contrast. Multiplanar CT image reconstructions   of the cervical spine and maxillofacial structures were also generated. COMPARISON:  Head CT 02/01/2015. Head, cervical, and maxillofacial CT 09/14/2013. FINDINGS: CT HEAD FINDINGS There is no evidence of acute cortical infarct, intracranial hemorrhage, mass, midline shift, or extra-axial fluid collection. Moderate cerebral atrophy is unchanged. Periventricular white-matter hypodensities are unchanged and nonspecific but compatible with mild-to-moderate chronic small vessel ischemic disease. Prior bilateral cataract extraction. Frontal scalp swelling/hematoma. Left maxillary sinus mucous retention cyst. Trace right mastoid effusion. No skull fracture. CT MAXILLOFACIAL FINDINGS There is soft tissue swelling/hematoma involving the frontal scalp soft tissues. No acute maxillofacial fracture is identified. Slight nasal bone deformity is chronic. Prior bilateral cataract extraction is noted. Small right sphenoid and moderate left maxillary sinus mucous retention cysts are present. There is a trace right mastoid effusion. CT CERVICAL SPINE FINDINGS Vertebral alignment is unchanged, with trace anterolisthesis again seen of C4 on C5 and C7 on T1. Multilevel disc space narrowing does not appear significantly changed, severe at C5-6, C6-7, and T1-2. Mild, chronic T1 superior endplate compression fracture is unchanged. Facet ankylosis is noted on the right at C3-4 and bilaterally at C4-5. Prominent calcified pannus is again seen posterior to the dens with associated erosions of the dens as well as C1 lateral masses. Mild posterior subluxation of the right lateral mass with respect to C2 is unchanged. No acute cervical spine fracture is identified. Mild centrilobular emphysema is partially visualized in the lung apices.  IMPRESSION: 1. No evidence of acute intracranial abnormality. 2. Moderate cerebral atrophy and mild-to-moderate chronic small vessel ischemic disease. 3. Frontal scalp hematoma.  No acute maxillofacial fracture. 4. No acute osseous abnormality in the cervical spine. Electronically Signed   By: Logan Bores M.D.   On: 04/13/2015 15:34   Ct Cervical Spine Wo Contrast  04/13/2015  CLINICAL DATA:  Unwitnessed fall in the bathroom this morning, landing face down on the floor. Swelling and bruising around both eyes and nose. Uncertain loss of consciousness. Initial encounter. EXAM: CT HEAD WITHOUT CONTRAST CT MAXILLOFACIAL WITHOUT CONTRAST CT CERVICAL SPINE WITHOUT CONTRAST TECHNIQUE: Multidetector CT imaging of the head, cervical spine, and maxillofacial structures were performed using the standard protocol without intravenous contrast. Multiplanar CT image reconstructions of the cervical spine and maxillofacial structures were also generated. COMPARISON:  Head CT 02/01/2015. Head, cervical, and maxillofacial CT 09/14/2013. FINDINGS: CT HEAD FINDINGS There is no evidence of acute cortical infarct, intracranial hemorrhage, mass, midline shift, or extra-axial fluid collection. Moderate cerebral atrophy is unchanged. Periventricular white-matter hypodensities are unchanged and nonspecific but compatible with mild-to-moderate chronic small vessel ischemic disease. Prior bilateral cataract extraction. Frontal scalp swelling/hematoma. Left maxillary sinus mucous retention cyst. Trace right mastoid effusion. No skull fracture. CT MAXILLOFACIAL FINDINGS There is soft tissue swelling/hematoma involving the frontal scalp soft tissues. No acute maxillofacial fracture is identified. Slight nasal bone deformity is chronic. Prior bilateral cataract extraction is noted. Small right sphenoid and moderate left maxillary sinus mucous retention cysts are present. There is a trace right mastoid effusion. CT CERVICAL SPINE FINDINGS  Vertebral alignment is unchanged, with trace anterolisthesis again seen of C4 on C5 and C7 on T1. Multilevel disc space narrowing does not appear significantly changed, severe at C5-6, C6-7, and T1-2. Mild, chronic T1 superior endplate compression fracture is unchanged. Facet ankylosis is noted on the right at C3-4 and bilaterally at C4-5. Prominent calcified pannus is again seen posterior to the dens with associated erosions of the dens as well as C1 lateral masses. Mild posterior subluxation of the right lateral  mass with respect to C2 is unchanged. No acute cervical spine fracture is identified. Mild centrilobular emphysema is partially visualized in the lung apices. IMPRESSION: 1. No evidence of acute intracranial abnormality. 2. Moderate cerebral atrophy and mild-to-moderate chronic small vessel ischemic disease. 3. Frontal scalp hematoma.  No acute maxillofacial fracture. 4. No acute osseous abnormality in the cervical spine. Electronically Signed   By: Allen  Grady M.D.   On: 04/13/2015 15:34   Dg Shoulder Left  04/13/2015  CLINICAL DATA:  Lateral LEFT shoulder pain, fell last night, initial encounter EXAM: LEFT SHOULDER - 2+ VIEW COMPARISON:  09/14/2013 FINDINGS: Osseous demineralization. AC joint alignment normal. Minimal acromial spur formation. No acute fracture, dislocation, or bone destruction. Visualized LEFT ribs intact. IMPRESSION: No acute osseous abnormalities. Electronically Signed   By: Mark  Boles M.D.   On: 04/13/2015 15:40   Ct Maxillofacial Wo Cm  04/13/2015  CLINICAL DATA:  Unwitnessed fall in the bathroom this morning, landing face down on the floor. Swelling and bruising around both eyes and nose. Uncertain loss of consciousness. Initial encounter. EXAM: CT HEAD WITHOUT CONTRAST CT MAXILLOFACIAL WITHOUT CONTRAST CT CERVICAL SPINE WITHOUT CONTRAST TECHNIQUE: Multidetector CT imaging of the head, cervical spine, and maxillofacial structures were performed using the standard protocol  without intravenous contrast. Multiplanar CT image reconstructions of the cervical spine and maxillofacial structures were also generated. COMPARISON:  Head CT 02/01/2015. Head, cervical, and maxillofacial CT 09/14/2013. FINDINGS: CT HEAD FINDINGS There is no evidence of acute cortical infarct, intracranial hemorrhage, mass, midline shift, or extra-axial fluid collection. Moderate cerebral atrophy is unchanged. Periventricular white-matter hypodensities are unchanged and nonspecific but compatible with mild-to-moderate chronic small vessel ischemic disease. Prior bilateral cataract extraction. Frontal scalp swelling/hematoma. Left maxillary sinus mucous retention cyst. Trace right mastoid effusion. No skull fracture. CT MAXILLOFACIAL FINDINGS There is soft tissue swelling/hematoma involving the frontal scalp soft tissues. No acute maxillofacial fracture is identified. Slight nasal bone deformity is chronic. Prior bilateral cataract extraction is noted. Small right sphenoid and moderate left maxillary sinus mucous retention cysts are present. There is a trace right mastoid effusion. CT CERVICAL SPINE FINDINGS Vertebral alignment is unchanged, with trace anterolisthesis again seen of C4 on C5 and C7 on T1. Multilevel disc space narrowing does not appear significantly changed, severe at C5-6, C6-7, and T1-2. Mild, chronic T1 superior endplate compression fracture is unchanged. Facet ankylosis is noted on the right at C3-4 and bilaterally at C4-5. Prominent calcified pannus is again seen posterior to the dens with associated erosions of the dens as well as C1 lateral masses. Mild posterior subluxation of the right lateral mass with respect to C2 is unchanged. No acute cervical spine fracture is identified. Mild centrilobular emphysema is partially visualized in the lung apices. IMPRESSION: 1. No evidence of acute intracranial abnormality. 2. Moderate cerebral atrophy and mild-to-moderate chronic small vessel ischemic  disease. 3. Frontal scalp hematoma.  No acute maxillofacial fracture. 4. No acute osseous abnormality in the cervical spine. Electronically Signed   By: Allen  Grady M.D.   On: 04/13/2015 15:34     Assessment/Plan  1. History of fall Patient is at high risk for additional falls due to his unstable gait, confusion, and Parkinson's disease with postural hypotension.  2. Acute delirium Due to the fluctuating levels of alertness with excessive grogginess at times, I believe he is still having problems with delirium. Future lab will be CBC, CMP, TSH  3. Non-traumatic rhabdomyolysis Resolved  4. Unstable gait Patient is highly likely to be a   high risk fall candidate in the future. It is unlikely that he will regain enough gait stability to be viewed as anything other than high risk.  5. Acute renal failure with tubular necrosis (HCC) Appears to be resolved -CMP  6. Orthostatic hypotension Currently taking both Florinef and midodrine  7. Parkinson's disease (HCC) Remains on Sinemet  8. Dysphagia History of Zenker's diverticulum. Continue to monitor intake as well as choking and coughing episodes.  9. Primary osteoarthritis involving multiple joints Observe   10. Dementia with Parkinsonism Consider use of medications in the future if  11. Essential hypertension Not currently medicated. If he becomes more stable, will consider adding medication in the future.  12. Dyslipidemia Follow-up in the future  13. Clinical depression Daughter believes that patient was adversely affected by initiation of therapy with Paxil. This drug is to be discontinued today.  14. Cerebrovascular disease Potential contributor to cognitive deficits in addition to his Parkinson's disease. Patient has been on aspirin 325 mg. His daughter would like this reduce 81 mg every other day. I consented to this.   I contacted his daughter by telephone at the end of the patient's evaluation. We discussed  thoroughly his current situation and medication changes. 

## 2015-04-22 NOTE — Discharge Instructions (Signed)
1. Medications: usual home medications 2. Treatment: rest, drink plenty of fluids 3. Follow Up: please followup with your primary doctor in 1 week for discussion of your diagnoses and further evaluation after today's visit; please return to the ER for new or worsening symptoms   Emergency Department Resource Guide 1) Find a Doctor and Pay Out of Pocket Although you won't have to find out who is covered by your insurance plan, it is a good idea to ask around and get recommendations. You will then need to call the office and see if the doctor you have chosen will accept you as a new patient and what types of options they offer for patients who are self-pay. Some doctors offer discounts or will set up payment plans for their patients who do not have insurance, but you will need to ask so you aren't surprised when you get to your appointment.  2) Contact Your Local Health Department Not all health departments have doctors that can see patients for sick visits, but many do, so it is worth a call to see if yours does. If you don't know where your local health department is, you can check in your phone book. The CDC also has a tool to help you locate your state's health department, and many state websites also have listings of all of their local health departments.  3) Find a [facility] If your illness is not likely to be very severe or complicated, you may want to try a walk in clinic. These are popping up all over the country in pharmacies, drugstores, and shopping centers. They're usually staffed by nurse practitioners or physician assistants that have been trained to treat common illnesses and complaints. They're usually fairly quick and inexpensive. However, if you have serious medical issues or chronic medical problems, these are probably not your best option.  No Primary Care Doctor: - Call Health Connect at  [phone] - they can help you locate a primary care doctor that  accepts your insurance,  provides certain services, etc. - Physician Referral Service- [phone]  Chronic Pain Problems: Organization         Address  Phone   Notes  [facility]  [phone] Patients need to be referred by their primary care doctor.   Medication Assistance: Organization         Address  Phone   Notes  [facility] Medication [facility] [address] [place], [place] [address] [phone] --Must be a resident of [facility] -- Must have NO insurance coverage whatsoever (no Medicaid/ Medicare, etc.) -- The pt. MUST have a primary care doctor that directs their care regularly and follows them in the community   MedAssist  [phone]   [employer]  [phone]    Agencies that provide inexpensive medical care: Organization         Address  Phone   Notes  [place]  [phone]   [facility]    [phone]   [facility] [place], [place] [address] [phone]   [place] [address], [place] [phone]   Planned Parenthood    [phone]   [facility]    [phone]   [place] and [address], [place] Phone:  [phone], Fax:  [phone] Hours of Operation:  9 am - 6 pm, M-F.  Also accepts Medicaid/Medicare and self-pay.  [facility] for [place] [address] [address], [address], [place] Phone: [phone], Fax: [phone]. Hours of Operation:  8:30 am - 5:30 pm, M-F.  Also accepts Medicaid and self-pay.  [facility] [address] [place] Phone: [phone]   [place], [place], [place] [phone], Ext. [phone] Mondays & Thursdays: 7-9 AM.  First 15 patients are seen on a first come, first serve basis.    [place] Providers:  Organization         Address  Phone    Notes  [facility] [address], [place] [phone] Also accepts self-pay patients.  [facility] [address], [place]  [phone]   [place], [address], [place] [phone]   [facility] [address] [place] [phone]   Lucianne Lei [address], [place]   [phone] Only accepts [place] Access [place] patients after they have their name applied to their card.   Self-Pay (no insurance) in [facility]:  Organization         Address  Phone   Notes  Sickle Cell Patients, [facility] [place] [phone]   [facility] Urgent Care [place] [phone]   [facility] [place]  [place], [place], [place] [phone]   [facility]/Dr. Osei-Bonsu  [address] [place] or [address], [place] [phone] Phone number for both [place] and [place] locations is the same.  [facility] [address], [place] [phone]   [facility] [address] [place] or [address] [phone]   [facility] [address], [place] [phone], phone; [phone], fax Sees patients 1st and 3rd Saturday of every month.  Must not qualify for public or private insurance (i.e. Medicaid, Medicare, [place] Health Choice, Veterans' Benefits)  Household income should be no more than 200% of the poverty level The clinic cannot treat you if you are pregnant or think you are pregnant  Sexually transmitted diseases are not treated at the clinic.    Dental Care: Organization         Address  Phone  Notes  [facility] Department of [facility] [phone] Accepts children up to age 90 who are enrolled in [place] or [place]; pregnant women with a Medicaid card; and children who have applied for Medicaid or [place] Health Choice, but were declined, whose parents can pay a reduced fee at time of service.  [facility] Department of [facility]  [address], [place] [phone] Accepts children up to age 80 who are enrolled in [place] or [place]; pregnant women with a Medicaid card; and children who have applied for Medicaid or [place] Health Choice, but were declined, whose parents can pay a reduced fee at time of service.  [place] Adult [facility] Access PROGRAM  [place] [phone] Patients are seen by appointment only. Walk-ins are not accepted. [place] will see patients 23 years of age and older. Monday - Tuesday (  8am-5pm) Most Wednesdays (8:30-5pm) $30 per visit, cash only  [facility] Adult Dental Access PROGRAM  [address], [facility] [phone] Patients are seen by appointment only. Walk-ins are not accepted. [place] will see patients 59 years of age and older. One Wednesday Evening (Monthly: Volunteer Based).  $30 per visit, cash only  [place]  [phone] for adults; Children under age 20, call Graduate Pediatric Dentistry at [phone]. Children aged 89-14, please call [phone] to request a pediatric application.  Dental services are provided in all areas of dental care including fillings, crowns and bridges, complete and partial dentures, implants, gum treatment, root canals, and extractions. Preventive care is also provided. Treatment is provided to both adults and children. Patients are selected via a lottery and there is often a waiting list.   [facility] [address], [place]  [phone] [email or web address]   [facility] [address] [place], [place] [phone], Ext.  123 Second and Fourth Thursday of each month, opens at 6:30 AM; Clinic ends at 9 AM.  Patients are seen on a first-come first-served basis, and a limited number are seen during each clinic.   [facility]  [address] [place] Danker [place], [place] [phone]   Eligibility Requirements You must have lived in [place], [place], or [place] counties for at least the last three months.   You cannot be eligible for state or federal sponsored [employer], including [employer], [place], or [employer].   You generally cannot be eligible for healthcare insurance through your employer.    How to apply: Eligibility screenings are held every Tuesday and Wednesday afternoon from 1:00 pm until 4:00 pm. You do not need an appointment for the interview!  [facility] [address] [place], [place]   [place]  [place] Department  [place]  [phone]    Behavioral Health Resources in the Community: Intensive Outpatient Programs Organization         Address  Phone  Notes  [place]. [address], [place], [place] [phone]   [facility] [address] Outpatient [address], [place], [place]   ADS: [facility] [address] [place], [place]   [place] [address]  [place], [place] or [phone]   Substance Abuse Resources Organization         Address  Phone  Notes  Alcohol and Drug Services  [phone]   [place]  [phone]   [facility]   Chinita Pester  [phone]   Residential & Outpatient Substance Abuse Program  [phone]   Psychological Services Organization         Address  Phone  Notes  [facility] [place]  [place]  [phone]   [place] [address], [place] or [phone]    Mobile Crisis Teams Organization         Address  Phone  Notes  [facility], Mobile Crisis Care Unit  [phone]   [facility]  [address] [place], [place]   [place] Levels [address] [place] [phone]    Self-Help/Support Groups Organization         Address  Phone  Notes  Mental Health Assoc. of [place] - variety of support groups  [place] Call for more information  Narcotics Anonymous (NA), [facility] [address], [employer] [place]  2 meetings at this location   Special educational needs teacher         Address  Phone  Notes  [facility] Residential Treatment [place],    [place]  [phone]   [facility]  [address] [place] [phone], [place], [place]   [place], [place] [phone] Admissions: 8am-3pm M-F  Incentives Substance [place] [address]    [place], [place] [phone]   [facility] [address] [place], [place], [place]   [facility] [address],  [place], [place]   Insight Programs - Intensive Outpatient [address], [place], [place]   [facility] ([place].) [place].,  [place], [place] [phone] or [phone]   [facility]idential Treatment Services (RTS) [address], [place], [place] Accepts Medicaid  [facility] [place] [address]  [place] [phone] Substance Abuse/Addiction Treatment   [facility] Organization         Address  Phone  Notes  [facility]  [phone]   Domenic Schwab, PhD [address] Arlis [place], [place]   [phone] or [phone]   [place]  [place], [place] [phone]   [facility] [address] [place], [place] [phone] Insurance/Medicaid/sponsorship through [facility] and Families [address]                                    [place], [place] [phone] [place] [address], [place] [phone]    Dr. Adele Schilder  [phone]   [facility] Dept. 1) [address] [place] 2) [place] 3)  [place] [address], [place] [phone]  [phone]   [place] [phone] or [phone] (After Hours)

## 2015-04-22 NOTE — ED Notes (Signed)
Pt moved to room 4 for better observation.  Pt disoriented and keeps getting out of bed.  Charge RN aware.

## 2015-04-22 NOTE — ED Notes (Signed)
Pt continually trying to get out of bed. Pt moved from room 2 to 4 so he is in view of the nurses station.

## 2015-04-22 NOTE — ED Notes (Signed)
Called report at [facility] to [place], LPN.

## 2015-04-22 NOTE — ED Notes (Addendum)
Pt resting quietly.  NAD.  Safety sitter at bedside.

## 2015-04-22 NOTE — ED Notes (Signed)
Daughter's Rise Paganini Shray [place]) number is [phone] per Santiago Glad, LPN at [facility].

## 2015-04-22 NOTE — ED Notes (Signed)
RN tried to call report at [place] LPN there reports pt is currently living in the independent living area.  This RN told her he is a high fall risk and needs assistance to ambulate due to ongoing dizziness.  Darius Castro reports pt will need an FL2 signed by MD to get Level of care changed.  Darius Castro reports that someone will be at nursing home after 8am to help facilitate this but they will still need the form [facility]) signed by an MD.

## 2015-04-23 ENCOUNTER — Encounter: Payer: Self-pay | Admitting: Nurse Practitioner

## 2015-04-23 ENCOUNTER — Ambulatory Visit: Payer: Medicare Other | Admitting: Family Medicine

## 2015-04-23 ENCOUNTER — Non-Acute Institutional Stay (SKILLED_NURSING_FACILITY): Payer: Medicare Other | Admitting: Nurse Practitioner

## 2015-04-23 ENCOUNTER — Telehealth: Payer: Self-pay | Admitting: *Deleted

## 2015-04-23 DIAGNOSIS — G3183 Dementia with Lewy bodies: Secondary | ICD-10-CM

## 2015-04-23 DIAGNOSIS — K5900 Constipation, unspecified: Secondary | ICD-10-CM

## 2015-04-23 DIAGNOSIS — R296 Repeated falls: Secondary | ICD-10-CM | POA: Insufficient documentation

## 2015-04-23 DIAGNOSIS — D649 Anemia, unspecified: Secondary | ICD-10-CM | POA: Diagnosis not present

## 2015-04-23 DIAGNOSIS — K592 Neurogenic bowel, not elsewhere classified: Secondary | ICD-10-CM

## 2015-04-23 DIAGNOSIS — W19XXXS Unspecified fall, sequela: Secondary | ICD-10-CM

## 2015-04-23 DIAGNOSIS — I10 Essential (primary) hypertension: Secondary | ICD-10-CM | POA: Diagnosis not present

## 2015-04-23 DIAGNOSIS — S0093XS Contusion of unspecified part of head, sequela: Secondary | ICD-10-CM | POA: Diagnosis not present

## 2015-04-23 DIAGNOSIS — J430 Unilateral pulmonary emphysema [MacLeod's syndrome]: Secondary | ICD-10-CM | POA: Diagnosis not present

## 2015-04-23 DIAGNOSIS — G20 Parkinson's disease: Secondary | ICD-10-CM | POA: Diagnosis not present

## 2015-04-23 DIAGNOSIS — IMO0001 Reserved for inherently not codable concepts without codable children: Secondary | ICD-10-CM

## 2015-04-23 DIAGNOSIS — I951 Orthostatic hypotension: Secondary | ICD-10-CM | POA: Diagnosis not present

## 2015-04-23 DIAGNOSIS — I679 Cerebrovascular disease, unspecified: Secondary | ICD-10-CM | POA: Diagnosis not present

## 2015-04-23 DIAGNOSIS — F329 Major depressive disorder, single episode, unspecified: Secondary | ICD-10-CM | POA: Diagnosis not present

## 2015-04-23 DIAGNOSIS — F0280 Dementia in other diseases classified elsewhere without behavioral disturbance: Secondary | ICD-10-CM

## 2015-04-23 DIAGNOSIS — F32A Depression, unspecified: Secondary | ICD-10-CM

## 2015-04-23 NOTE — Assessment & Plan Note (Signed)
04/19/15 Hgb 9.6, Vit B12 806, folate 17.8 continue FE 325mg  daily.

## 2015-04-23 NOTE — Assessment & Plan Note (Signed)
Mid occiput small laceration with one staple closure, no s/s of infection, observe. Multiple falls. Close supervision needed.

## 2015-04-23 NOTE — Assessment & Plan Note (Signed)
Stable, continue fibercon daily.

## 2015-04-23 NOTE — Assessment & Plan Note (Signed)
Florinef 0.1mg  BID, Proamatine 10mg  tid, persisted orthostatic hypotension.

## 2015-04-23 NOTE — Progress Notes (Signed)
Patient ID: Darius Castro, male   DOB: 03/26/1924, 81 y.o.   MRN: [id number]  Location:  SNF [facility] Provider:  Marlana Latus NP  Code Status:  Full code Goals of care: Advanced Directive information    Chief Complaint  Patient presents with  . Medical Management of Chronic Issues  . Acute Visit    s/p fall, laceration back of head     HPI: Patient is a 81 y.o. male seen in the SNF at [place] today for evaluation of 04/21/15 ED eval for a head laceration, one staple closure of the middle of the occiput, multiple falls since admitted to SNF, orthostatic hypotension noted, taking Midodrine and Florinef to maintain normotensive, CXR 04/17/15 showed no acute cardiopulmonary pathology.   04/13/15-04/16/15 hospital stay for Dehydration, improved after IVF, Forehead laceration, healing,   04/13/15 CT head Cervical spine maxillofacial no acute findings.  04/21/15 CT head: IMPRESSION: No acute intracranial process. Multiple small scalp hematomas. No skull fracture. 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     Review of Systems  Constitutional: Positive for malaise/fatigue. Negative for fever, chills, weight loss and diaphoresis.  HENT: Positive for hearing loss. Negative for congestion, ear discharge, ear pain, nosebleeds and sore throat.   Eyes: Negative for pain, discharge and redness.  Respiratory: Negative for cough, hemoptysis, sputum production and shortness of breath.   Cardiovascular: Negative for chest pain, palpitations, orthopnea, claudication and leg swelling.  Gastrointestinal: Negative for heartburn, nausea, vomiting, abdominal pain, diarrhea and constipation.  Genitourinary: Negative for dysuria and urgency.  Musculoskeletal: Negative for myalgias, back pain and neck pain.  Skin: Negative for itching and rash.       Facial bruise, healing. New mid occiput laceration sustained from fall  04/21/15 is closed with a staple, no s/s of infection.   Neurological: Negative for dizziness, tingling, tremors, sensory change, speech change, focal weakness, weakness and headaches.  Endo/Heme/Allergies: Negative for environmental allergies. Does not bruise/bleed easily.  Psychiatric/Behavioral: Positive for memory loss. Negative for depression. The patient is not nervous/anxious and does not have insomnia.        04/17/15 MMSE 21/30    Past Medical History  Diagnosis Date  . Hyperlipidemia     takes Lipitor daily  . Adenomatous colon polyp 3/09  . Personal history of prostate cancer   . Dysphagia     with pills  . Hypertension     takes Fosinopril daily  . Sleep apnea     sleep study done in 03/30/04 in epic;doesn't use a CPAP  . Pneumonia     hx of as a child  . Arthritis     back and hip  . Dizziness     pt states when he is hot and stands up too fast  . Back pain     hx of ruptured disc  . Dry skin   . Hemorrhoid     internal   . Hx of colonic polyps   . Nocturia   . Prostate cancer ([place])   . Impaired hearing   . 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 Diabetes mellitus     patient denies Diabetes  . COLONIC POLYPS, HX OF 11/05/2008    Qualifier: Diagnosis of  By: Julaine Hua CMA ([place]), Estill Bamberg    . History of fall 04/08/2015  . Dyslipidemia 04/22/2015  . Dysphagia 04/22/2015  . Obstructive sleep apnea 04/22/2015  . HOH (hard of hearing) 04/22/2015  .  Unstable gait 04/22/2015  . Clinical depression 04/19/2015    Patient Active Problem List   Diagnosis Date Noted  . Falling 04/23/2015  . Dyslipidemia 04/22/2015  . Dysphagia 04/22/2015  . Cerebrovascular disease 04/22/2015  . Cerebral atrophy 04/22/2015  . Obstructive sleep apnea 04/22/2015  . HOH (hard of hearing) 04/22/2015  . Emphysema of lung ([place]) 04/22/2015  . Unstable gait 04/22/2015  . Acute delirium 04/22/2015  . Dementia with Parkinsonism 04/19/2015  . Clinical depression 04/19/2015  .  Hypokalemia 04/19/2015  . Dehydration   . Acute renal failure (ARF) ([place]) 04/13/2015  . Traumatic hematoma of head 04/13/2015  . Normocytic anemia 04/13/2015  . Hyponatremia 04/13/2015  . Rhabdomyolysis 04/13/2015  . History of fall 04/08/2015  . Parkinson's disease ([place]) 04/16/2014  . Constipation due to neurogenic bowel 03/26/2014  . Hypertension 09/05/2013  . Orthostatic hypotension 08/23/2013  . 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 Osteoarthritis 01/12/2011  . Hyperlipidemia 11/02/2006  . PROSTATE CANCER, HX OF 11/02/2006    No Known Allergies  Medications: Patient's Medications  New Prescriptions   No medications on file  Previous Medications   BACITRACIN OINTMENT    Apply topically 2 (two) times daily. To any skin abrasion   CARBIDOPA-LEVODOPA (SINEMET IR) 25-100 MG PER TABLET    Take 1.5 tablets by mouth 3 (three) times daily.   COENZYME Q10 (CO Q-10) 100 MG CAPS    Take 100 mg by mouth 3 (three) times daily.   FLUDROCORTISONE (FLORINEF) 0.1 MG TABLET    TAKE 1 TABLET BY MOUTH EVERY DAY   KLOR-CON M10 10 MEQ TABLET    TAKE 1 TABLET (10 MEQ TOTAL) BY MOUTH DAILY.   MIDODRINE (PROAMATINE) 10 MG TABLET    Take 1 tablet (10 mg total) by mouth 3 (three) times daily.   POLYCARBOPHIL (FIBERCON) 625 MG TABLET    Take 625 mg by mouth daily.  Modified Medications   No medications on file  Discontinued Medications   No medications on file    Physical Exam: Filed Vitals:   04/23/15 1424  BP: 148/68  Pulse: 72  Temp: 97 F (36.1 C)  TempSrc: Tympanic  Resp: 18   There is no weight on file to calculate BMI.  Physical Exam  Skin:  Facial bruise, healing. New mid occiput laceration sustained from fall 04/21/15 is closed with a staple, no s/s of infection.      Labs reviewed: Basic Metabolic Panel:  Recent Labs  04/14/15 0331 04/15/15 0446 04/19/15 04/21/15 2259  NA 130* 136 130* 136  K 3.4* 3.6 3.5 3.4*  CL 95* 101  --  94*  CO2 28 26  --  33*    GLUCOSE 110* 103*  --  109*  BUN 35* 23* 30* 23*  CREATININE 1.57* 0.96 1.0 0.97  CALCIUM 8.3* 8.1*  --  8.8*    Liver Function Tests:  Recent Labs  05/31/14 1156 04/19/15 04/21/15 2259  AST 24 26 29   ALT 14 21 12*  ALKPHOS 48 67 76  BILITOT 0.5  --  0.5  PROT 6.2  --  6.5  ALBUMIN 4.0  --  3.9    CBC:  Recent Labs  04/08/15 1123 04/13/15 1429 04/14/15 0331 04/19/15 04/21/15 2259  WBC 7.7 12.5* 9.5 7.8 8.5  NEUTROABS 5.9 11.1*  --   --  6.3  HGB 12.7* 10.2* 8.4* 9.4* 10.1*  HCT 38.0* 29.7* 24.6* 28* 29.4*  MCV 94.2 91.1 90.8  --  92.7  PLT 281.0  221 220 324 335    Lab Results  Component Value Date   TSH 3.19 04/19/2015   Lab Results  Component Value Date   HGBA1C 6.2 05/30/2012   Lab Results  Component Value Date   CHOL 155 05/30/2012   HDL 68.90 05/30/2012   LDLCALC 75 05/30/2012   TRIG 57.0 05/30/2012   CHOLHDL 2 05/30/2012    Significant Diagnostic Results since last visit: none  Patient Care Team: Marin Olp, MD as PCP - General (Family Medicine) Ludwig Clarks, DO as Consulting Physician (Neurology) Ladene Artist, MD as Consulting Physician (Gastroenterology) Elsie Saas, MD as Consulting Physician (Orthopedic Surgery) Melida Quitter, MD as Consulting Physician (Otolaryngology)  Assessment/Plan Problem List Items Addressed This Visit    Cerebrovascular disease - Primary (Chronic)   Clinical depression    Flat affect noted, continue Paxil 20mg  daily.       Constipation due to neurogenic bowel    Stable, continue fibercon daily.       Dementia with Parkinsonism (Chronic)    04/17/15 MMSE 21/30, may consider memory preserving meds in the future.       Emphysema of lung ([place])    Stable.       Falling    Multiple falls, last fall 04/21/15 with mid occiput laceration, ED eval, CT head w/o acute process. Unsteadiness from advanced Parkinson's dx, orthohypotension, increased frailty, lack of safety awareness 2nd to progression of  dementia all contributory. Intensive supervision needed for safety.       Hypertension (Chronic)    Persisted orthostatic hypotension.       Normocytic anemia (Chronic)    04/19/15 Hgb 9.6, Vit B12 806, folate 17.8 continue FE 325mg  daily.       Orthostatic hypotension (Chronic)    Florinef 0.1mg  BID, Proamatine 10mg  tid, persisted orthostatic hypotension.       Parkinson's disease ([place]) (Chronic)    Gait instability, falling, continue Sinemet 25/100 1.5 tab tid.       Traumatic hematoma of head (Chronic)    Mid occiput small laceration with one staple closure, no s/s of infection, observe. Multiple falls. Close supervision needed.           Family/ staff Communication: risk for falling.   Labs/tests ordered: none  ManXie Collen Hostler NP Geriatrics [facility] [address], [place] [address] On Call:  [phone] & follow prompts after 5pm & weekends Office Phone:  [phone] Office Fax:  [phone]

## 2015-04-23 NOTE — Assessment & Plan Note (Signed)
Stable

## 2015-04-23 NOTE — Telephone Encounter (Signed)
Patient daughter, Rise Paganini stated that she was suppose to meet with you today at [facility] to discuss her father.  She stated that she spoke with you at length last Thursday. She was at [facility] today waiting for you. Would like to speak with you. Please call [phone]

## 2015-04-23 NOTE — Assessment & Plan Note (Signed)
Flat affect noted, continue Paxil 20mg  daily.

## 2015-04-23 NOTE — Assessment & Plan Note (Signed)
Multiple falls, last fall 04/21/15 with mid occiput laceration, ED eval, CT head w/o acute process. Unsteadiness from advanced Parkinson's dx, orthohypotension, increased frailty, lack of safety awareness 2nd to progression of dementia all contributory. Intensive supervision needed for safety.

## 2015-04-23 NOTE — Assessment & Plan Note (Signed)
04/17/15 MMSE 21/30, may consider memory preserving meds in the future.

## 2015-04-23 NOTE — Assessment & Plan Note (Signed)
Persisted orthostatic hypotension.

## 2015-04-23 NOTE — Telephone Encounter (Signed)
Dr. Nyoka Cowden. I had an interaction with daughter in office today. She believes patients anemia (even when it was 12.5-12.8) has been the cause for his dizziness. She really wants to "get to the bottom" of this. She thought this was not being followed and I told her the hgb had improved to >10 and that you had followed it on 2 occasions already. She is interested in etiology and wants to give her father "shots to fix this" which I explained would not be used in this clinical situation.   I advised her to try to find a time that you would be seeing her father so that she could sit down and express her concerns or I am happy to do so if/when patient ever gets out of SNF. They had not seen me since 05/2014 though follow up was planned for February 2016 so not clear why that wasn't completed.

## 2015-04-23 NOTE — Telephone Encounter (Signed)
Thank you for your continued interest in Darius Castro. I had informed his daughter that we would take care of his primary care needs while he was in the skilled nursing facility. I don't think I need any additional information at this time. I will give your office call if something comes up.

## 2015-04-23 NOTE — Assessment & Plan Note (Signed)
Gait instability, falling, continue Sinemet 25/100 1.5 tab tid.

## 2015-04-24 NOTE — Telephone Encounter (Signed)
I returned call to [facility] and discussed patient's health, cognition, and anemia.

## 2015-04-25 ENCOUNTER — Non-Acute Institutional Stay (SKILLED_NURSING_FACILITY): Payer: Medicare Other | Admitting: Internal Medicine

## 2015-04-25 DIAGNOSIS — I10 Essential (primary) hypertension: Secondary | ICD-10-CM | POA: Diagnosis not present

## 2015-04-25 DIAGNOSIS — S0093XS Contusion of unspecified part of head, sequela: Secondary | ICD-10-CM

## 2015-04-25 DIAGNOSIS — E871 Hypo-osmolality and hyponatremia: Secondary | ICD-10-CM | POA: Diagnosis not present

## 2015-04-25 DIAGNOSIS — W19XXXS Unspecified fall, sequela: Secondary | ICD-10-CM

## 2015-04-25 DIAGNOSIS — D649 Anemia, unspecified: Secondary | ICD-10-CM | POA: Diagnosis not present

## 2015-04-25 DIAGNOSIS — S0100XD Unspecified open wound of scalp, subsequent encounter: Secondary | ICD-10-CM

## 2015-04-25 DIAGNOSIS — I951 Orthostatic hypotension: Secondary | ICD-10-CM

## 2015-04-25 DIAGNOSIS — IMO0001 Reserved for inherently not codable concepts without codable children: Secondary | ICD-10-CM

## 2015-04-25 DIAGNOSIS — R410 Disorientation, unspecified: Secondary | ICD-10-CM

## 2015-04-29 ENCOUNTER — Encounter: Payer: Self-pay | Admitting: Internal Medicine

## 2015-04-29 NOTE — Progress Notes (Signed)
Patient ID: Darius Castro, male   DOB: October 12, 1923, 81 y.o.   MRN: [id number]    [place] Room Number: N 31  Place of Service: SNF (31)     No Known Allergies  Chief Complaint  Patient presents with  . Acute Visit    Follow-up of encephalopathy    HPI:  When I last saw this patient he was terribly confused and quite groggy. Following my initial visit, we discontinued Paxil. Patient had sustained a large hematoma left forehead with a laceration and subsequent periorbital ecchymoses. I presumed that his delirium was related to his contusion, but recognize that the Paxil may have contributed to a change in mental status.  Patient began to improve quickly after I last saw him. He is now alert and up in a wheelchair. He is impulsive in his actions, and has fallen. Injuries at that time caused the staff to be concerned about possible further injury to the brain and he was sent to the emergency room. He returned to our facility following evaluation.  He is able to stand now but needs the help of others to maintain balance and reduce the risk of falling.  Patient has had mild hypertension, but also suffers from postural hypotension. He is wearing compression stockings now.  Lab work done following my last visit included iron studies. These showed a slightly low iron saturation. Other labs showed a modest elevation in BUN to 30, glucose 105, and glucose creatinine 1.05. Liver enzymes, TSH, C58 527, folic acid 78.2 oral normal  Hemoglobin on 04/18/2015 was 9.6, and was improved when he went to the emergency room.  Low sodium noted in the hospital at 129, has returned to 130, which is still low. Paxil can cause low sodium levels. We will continue to follow this with additional lab.  Patient is participating in physical therapy.  Medications: Patient's Medications  New Prescriptions   No medications on file  Previous Medications   BACITRACIN OINTMENT    Apply  topically 2 (two) times daily. To any skin abrasion   CARBIDOPA-LEVODOPA (SINEMET IR) 25-100 MG PER TABLET    Take 1.5 tablets by mouth 3 (three) times daily.   COENZYME Q10 (CO Q-10) 100 MG CAPS    Take 100 mg by mouth 3 (three) times daily.   FLUDROCORTISONE (FLORINEF) 0.1 MG TABLET    TAKE 1 TABLET BY MOUTH EVERY DAY   KLOR-CON M10 10 MEQ TABLET    TAKE 1 TABLET (10 MEQ TOTAL) BY MOUTH DAILY.   MIDODRINE (PROAMATINE) 10 MG TABLET    Take 1 tablet (10 mg total) by mouth 3 (three) times daily.   POLYCARBOPHIL (FIBERCON) 625 MG TABLET    Take 625 mg by mouth daily.  Modified Medications   No medications on file  Discontinued Medications   No medications on file     Review of Systems  Constitutional: Negative for fever, chills and diaphoresis.  HENT: Positive for hearing loss. Negative for congestion, ear discharge, ear pain, nosebleeds and sore throat.   Eyes: Negative for pain, discharge and redness.  Respiratory: Negative for cough and shortness of breath.   Cardiovascular: Negative for chest pain, palpitations and leg swelling.  Gastrointestinal: Negative for nausea, vomiting, abdominal pain, diarrhea and constipation.  Genitourinary: Negative for dysuria and urgency.  Musculoskeletal: Negative for myalgias, back pain and neck pain.  Skin: Negative for rash.       Facial bruise, healing. New mid occiput laceration sustained from  fall 04/21/15 is closed with a staple, no s/s of infection.   Allergic/Immunologic: Negative for environmental allergies.  Neurological: Negative for dizziness, tremors, weakness and headaches.  Hematological: Does not bruise/bleed easily.  Psychiatric/Behavioral: The patient is not nervous/anxious.        04/17/15 MMSE 21/30    Filed Vitals:   04/29/15 1632  BP: 170/74  Pulse: 78  Temp: 98 F (36.7 C)  Resp: 20  SpO2: 94%   There is no weight on file to calculate BMI.  Physical Exam  Constitutional: He is oriented to person, place, and time. He  appears well-developed and well-nourished. No distress.  HENT:  Right Ear: External ear normal.  Left Ear: External ear normal.  Nose: Nose normal.  Mouth/Throat: Oropharynx is clear and moist. No oropharyngeal exudate.  Eyes: Conjunctivae and EOM are normal. Pupils are equal, round, and reactive to light.  Neck: No JVD present. No tracheal deviation present. No thyromegaly present.  Cardiovascular: Normal rate, regular rhythm, normal heart sounds and intact distal pulses.  Exam reveals no gallop and no friction rub.   No murmur heard. Pulmonary/Chest: No respiratory distress. He has no wheezes. He has no rales. He exhibits no tenderness.  Abdominal: He exhibits no distension and no mass. There is no tenderness.  Musculoskeletal: Normal range of motion. He exhibits edema. He exhibits no tenderness.  Wearing compression stockings bilaterally. Unable to stand.  Lymphadenopathy:    He has no cervical adenopathy.  Neurological: He is alert and oriented to person, place, and time. He has normal reflexes. No cranial nerve deficit. Coordination normal.  Cognitive deficits.  Skin: No rash noted. No erythema. No pallor.  Facial bruise, healing. New mid occiput laceration sustained from fall 04/21/15 is closed with a staple, no s/s of infection.   Psychiatric: He has a normal mood and affect. His behavior is normal. Judgment and thought content normal.     Labs reviewed: Lab Summary Latest Ref Rng 04/21/2015 04/19/2015 04/15/2015 04/14/2015 04/13/2015  Hemoglobin 13.0 - 17.0 g/dL 10.1(L) 9.4(A) (None) 8.4(L) 10.2(L)  Hematocrit 39.0 - 52.0 % 29.4(L) 28(A) (None) 24.6(L) 29.7(L)  White count 4.0 - 10.5 K/uL 8.5 7.8 (None) 9.5 12.5(H)  Platelet count 150 - 400 K/uL 335 324 (None) 220 221  Sodium 135 - 145 mmol/L 136 130(A) 136 130(L) 129(L)  Potassium 3.5 - 5.1 mmol/L 3.4(L) 3.5 3.6 3.4(L) 4.0  Calcium 8.9 - 10.3 mg/dL 8.8(L) (None) 8.1(L) 8.3(L) 8.6(L)  Phosphorus - (None) (None) (None) (None)  (None)  Creatinine 0.61 - 1.24 mg/dL 0.97 1.0 0.96 1.57(H) 1.97(H)  AST 15 - 41 U/L 29 26 (None) (None) (None)  Alk Phos 38 - 126 U/L 76 67 (None) (None) (None)  Bilirubin 0.3 - 1.2 mg/dL 0.5 (None) (None) (None) (None)  Glucose 65 - 99 mg/dL 109(H) 111 103(H) 110(H) 139(H)  Cholesterol - (None) (None) (None) (None) (None)  HDL cholesterol - (None) (None) (None) (None) (None)  Triglycerides - (None) (None) (None) (None) (None)  LDL Direct - (None) (None) (None) (None) (None)  LDL Calc - (None) (None) (None) (None) (None)  Total protein 6.5 - 8.1 g/dL 6.5 (None) (None) (None) (None)  Albumin 3.5 - 5.0 g/dL 3.9 (None) (None) (None) (None)   Lab Results  Component Value Date   TSH 3.19 04/19/2015   Lab Results  Component Value Date   BUN 23* 04/21/2015   Lab Results  Component Value Date   HGBA1C 6.2 05/30/2012   04/13/15  Iron 45 - 182 ug/dL 34 (  L)   TIBC 250 - 450 ug/dL 308   Saturation Ratios 17.9 - 39.5 % 11 (L)   UIBC ug/dL 274          Assessment/Plan   1. Traumatic hematoma of head, sequela Resolving  2. Wound, open, scalp, subsequent encounter Single staple in place  3. Hyponatremia Slightly improved  4. Falling, sequela Patient continues with impulsively trying to get out of his chair and walk. Very unstable on feet and not yet capable of independent walking.  5. Essential hypertension Slightly elevated systolic blood pressure, but also suffers from postural hypotension.  6. Orthostatic hypotension Wearing compression stockings to sustain blood pressure  7. Normocytic anemia -Started ferrous sulfate 325 mg twice daily due to evidence of slightly low iron  8. Acute delirium Appears to be resolving

## 2015-05-02 LAB — CBC AND DIFFERENTIAL
HCT: 26 % — AB (ref 41–53)
HEMOGLOBIN: 8.7 g/dL — AB (ref 13.5–17.5)
Platelets: 366 10*3/uL (ref 150–399)
WBC: 6.8 10*3/mL

## 2015-05-02 LAB — BASIC METABOLIC PANEL
BUN: 18 mg/dL (ref 4–21)
CREATININE: 1 mg/dL (ref 0.6–1.3)
GLUCOSE: 97 mg/dL
POTASSIUM: 4.2 mmol/L (ref 3.4–5.3)
SODIUM: 142 mmol/L (ref 137–147)

## 2015-05-03 ENCOUNTER — Encounter: Payer: Self-pay | Admitting: Nurse Practitioner

## 2015-05-03 ENCOUNTER — Non-Acute Institutional Stay (SKILLED_NURSING_FACILITY): Payer: Medicare Other | Admitting: Nurse Practitioner

## 2015-05-03 DIAGNOSIS — F329 Major depressive disorder, single episode, unspecified: Secondary | ICD-10-CM | POA: Diagnosis not present

## 2015-05-03 DIAGNOSIS — I10 Essential (primary) hypertension: Secondary | ICD-10-CM | POA: Diagnosis not present

## 2015-05-03 DIAGNOSIS — G3183 Dementia with Lewy bodies: Secondary | ICD-10-CM

## 2015-05-03 DIAGNOSIS — F0280 Dementia in other diseases classified elsewhere without behavioral disturbance: Secondary | ICD-10-CM

## 2015-05-03 DIAGNOSIS — I951 Orthostatic hypotension: Secondary | ICD-10-CM

## 2015-05-03 DIAGNOSIS — E876 Hypokalemia: Secondary | ICD-10-CM | POA: Diagnosis not present

## 2015-05-03 DIAGNOSIS — E860 Dehydration: Secondary | ICD-10-CM | POA: Diagnosis not present

## 2015-05-03 DIAGNOSIS — J439 Emphysema, unspecified: Secondary | ICD-10-CM | POA: Diagnosis not present

## 2015-05-03 DIAGNOSIS — G20 Parkinson's disease: Secondary | ICD-10-CM

## 2015-05-03 DIAGNOSIS — K5900 Constipation, unspecified: Secondary | ICD-10-CM | POA: Diagnosis not present

## 2015-05-03 DIAGNOSIS — D649 Anemia, unspecified: Secondary | ICD-10-CM | POA: Diagnosis not present

## 2015-05-03 DIAGNOSIS — I679 Cerebrovascular disease, unspecified: Secondary | ICD-10-CM

## 2015-05-03 DIAGNOSIS — F32A Depression, unspecified: Secondary | ICD-10-CM

## 2015-05-03 NOTE — Assessment & Plan Note (Signed)
Stable

## 2015-05-03 NOTE — Assessment & Plan Note (Signed)
Gait instability, falling, continue Sinemet 25/100 1.5 tab tid.  

## 2015-05-03 NOTE — Assessment & Plan Note (Signed)
Flat affect noted, continue Paxil 20mg daily.  

## 2015-05-03 NOTE — Assessment & Plan Note (Signed)
Florinef 0.1mg BID, Proamatine 10mg tid, persisted orthostatic hypotension.  

## 2015-05-03 NOTE — Assessment & Plan Note (Signed)
Stable, continue fibercon daily.  

## 2015-05-03 NOTE — Assessment & Plan Note (Signed)
04/19/15 Hgb 9.6, Vit B12 806, folate 17.8, 05/02/15 Hgb 8.7 continue FE 325mg  daily.  Hematology referral

## 2015-05-03 NOTE — Assessment & Plan Note (Signed)
05/02/15 Na 142, K 4.2, Bun 18, creat 0.95

## 2015-05-03 NOTE — Assessment & Plan Note (Signed)
04/17/15 MMSE 21/30, may consider memory preserving meds in the future.  

## 2015-05-03 NOTE — Assessment & Plan Note (Signed)
Persisted orthostatic hypotension.  

## 2015-05-03 NOTE — Assessment & Plan Note (Signed)
05/02/15 K 4.2

## 2015-05-03 NOTE — Assessment & Plan Note (Signed)
Small vessel disease

## 2015-05-03 NOTE — Progress Notes (Signed)
Patient ID: Darius Castro, male   DOB: 1924-05-06, 81 y.o.   MRN: [id number]  Location:  SNF [facility] Provider:  Marlana Latus NP  Code Status:  Full code Goals of care: Advanced Directive information    Chief Complaint  Patient presents with  . Medical Management of Chronic Issues  . Acute Visit    anemia, Hgb 8.7 05/02/15, desires for Hematology referral.      HPI: Patient is a 81 y.o. male seen in the SNF at [facility] today for evaluation of anemia, Hgb 8.7 05/02/15, currently on Fe, last Vit B12 and Folate wnl.   04/21/15 ED eval for a head laceration, one staple closure of the middle of the occiput, healed nicely, multiple falls since admitted to SNF, orthostatic hypotension noted, taking Midodrine and Florinef to maintain normotensive, CXR 04/17/15 showed no acute cardiopulmonary pathology.     04/13/15-04/16/15 hospital stay for Dehydration, improved after IVF, Forehead laceration, healed,   04/13/15 CT head Cervical spine maxillofacial no acute findings.  04/21/15 CT head: IMPRESSION: No acute intracranial process. Multiple small scalp hematomas. No skull fracture. 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     Review of Systems  Constitutional: Positive for malaise/fatigue. Negative for fever, chills, weight loss and diaphoresis.  HENT: Positive for hearing loss. Negative for congestion, ear discharge, ear pain, nosebleeds and sore throat.   Eyes: Negative for pain, discharge and redness.  Respiratory: Negative for cough, hemoptysis, sputum production and shortness of breath.   Cardiovascular: Negative for chest pain, palpitations, orthopnea, claudication and leg swelling.  Gastrointestinal: Negative for heartburn, nausea, vomiting, abdominal pain, diarrhea and constipation.  Genitourinary: Negative for dysuria and urgency.  Musculoskeletal: Negative for myalgias, back pain and neck pain.    Skin: Negative for itching and rash.       Facial bruise, healed. New mid occiput laceration sustained from fall 04/21/15 is closed with a staple, healed.   Neurological: Negative for dizziness, tingling, tremors, sensory change, speech change, focal weakness, weakness and headaches.  Endo/Heme/Allergies: Negative for environmental allergies. Does not bruise/bleed easily.  Psychiatric/Behavioral: Positive for memory loss. Negative for depression. The patient is not nervous/anxious and does not have insomnia.        04/17/15 MMSE 21/30    Past Medical History  Diagnosis Date  . Hyperlipidemia     takes Lipitor daily  . Adenomatous colon polyp 3/09  . Personal history of prostate cancer   . Dysphagia     with pills  . Hypertension     takes Fosinopril daily  . Sleep apnea     sleep study done in 03/30/04 in epic;doesn't use a CPAP  . Pneumonia     hx of as a child  . Arthritis     back and hip  . Dizziness     pt states when he is hot and stands up too fast  . Back pain     hx of ruptured disc  . Dry skin   . Hemorrhoid     internal   . Hx of colonic polyps   . Nocturia   . Prostate cancer ([place])   . Impaired hearing   . 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 Diabetes mellitus     patient denies Diabetes  . COLONIC POLYPS, HX OF 11/05/2008    Qualifier: Diagnosis of  By: Julaine Hua CMA ([place]), Estill Bamberg    . History of fall 04/08/2015  .  Dyslipidemia 04/22/2015  . Dysphagia 04/22/2015  . Obstructive sleep apnea 04/22/2015  . HOH (hard of hearing) 04/22/2015  . Unstable gait 04/22/2015  . Clinical depression 04/19/2015    Patient Active Problem List   Diagnosis Date Noted  . Falling 04/23/2015  . Dyslipidemia 04/22/2015  . Dysphagia 04/22/2015  . Cerebrovascular disease 04/22/2015  . Cerebral atrophy 04/22/2015  . Obstructive sleep apnea 04/22/2015  . HOH (hard of hearing) 04/22/2015  . Emphysema of lung ([place]) 04/22/2015  . Unstable gait 04/22/2015  .  Acute delirium 04/22/2015  . Wound, open, scalp 04/21/2015  . Dementia with Parkinsonism 04/19/2015  . Clinical depression 04/19/2015  . Hypokalemia 04/19/2015  . Dehydration   . Traumatic hematoma of head 04/13/2015  . Normocytic anemia 04/13/2015  . Hyponatremia 04/13/2015  . History of fall 04/08/2015  . Parkinson's disease ([place]) 04/16/2014  . Constipation due to neurogenic bowel 03/26/2014  . Hypertension 09/05/2013  . Orthostatic hypotension 08/23/2013  . 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 Osteoarthritis 01/12/2011  . Hyperlipidemia 11/02/2006  . PROSTATE CANCER, HX OF 11/02/2006    No Known Allergies  Medications: Patient's Medications  New Prescriptions   No medications on file  Previous Medications   BACITRACIN OINTMENT    Apply topically 2 (two) times daily. To any skin abrasion   CARBIDOPA-LEVODOPA (SINEMET IR) 25-100 MG PER TABLET    Take 1.5 tablets by mouth 3 (three) times daily.   COENZYME Q10 (CO Q-10) 100 MG CAPS    Take 100 mg by mouth 3 (three) times daily.   FLUDROCORTISONE (FLORINEF) 0.1 MG TABLET    TAKE 1 TABLET BY MOUTH EVERY DAY   KLOR-CON M10 10 MEQ TABLET    TAKE 1 TABLET (10 MEQ TOTAL) BY MOUTH DAILY.   MIDODRINE (PROAMATINE) 10 MG TABLET    Take 1 tablet (10 mg total) by mouth 3 (three) times daily.   POLYCARBOPHIL (FIBERCON) 625 MG TABLET    Take 625 mg by mouth daily.  Modified Medications   No medications on file  Discontinued Medications   No medications on file    Physical Exam: Filed Vitals:   05/03/15 1114  BP: 123/64  Pulse: 64  Temp: 97.4 F (36.3 C)  TempSrc: Tympanic  Resp: 20   There is no weight on file to calculate BMI.  Physical Exam  Skin:  Facial bruise, healed New mid occiput laceration sustained from fall 04/21/15 is closed with a staple healed.      Labs reviewed: Basic Metabolic Panel:  Recent Labs  04/14/15 0331 04/15/15 0446 04/19/15 04/21/15 2259 05/02/15  NA 130* 136 130* 136 142  K  3.4* 3.6 3.5 3.4* 4.2  CL 95* 101  --  94*  --   CO2 28 26  --  33*  --   GLUCOSE 110* 103*  --  109*  --   BUN 35* 23* 30* 23* 18  CREATININE 1.57* 0.96 1.0 0.97 1.0  CALCIUM 8.3* 8.1*  --  8.8*  --     Liver Function Tests:  Recent Labs  05/31/14 1156 04/19/15 04/21/15 2259  AST 24 26 29   ALT 14 21 12*  ALKPHOS 48 67 76  BILITOT 0.5  --  0.5  PROT 6.2  --  6.5  ALBUMIN 4.0  --  3.9    CBC:  Recent Labs  04/08/15 1123 04/13/15 1429 04/14/15 0331 04/19/15 04/21/15 2259 05/02/15  WBC 7.7 12.5* 9.5 7.8 8.5 6.8  NEUTROABS 5.9 11.1*  --   --  6.3  --   HGB 12.7* 10.2* 8.4* 9.4* 10.1* 8.7*  HCT 38.0* 29.7* 24.6* 28* 29.4* 26*  MCV 94.2 91.1 90.8  --  92.7  --   PLT 281.0 221 220 324 335 366    Lab Results  Component Value Date   TSH 3.19 04/19/2015   Lab Results  Component Value Date   HGBA1C 6.2 05/30/2012   Lab Results  Component Value Date   CHOL 155 05/30/2012   HDL 68.90 05/30/2012   LDLCALC 75 05/30/2012   TRIG 57.0 05/30/2012   CHOLHDL 2 05/30/2012    Significant Diagnostic Results since last visit: none  Patient Care Team: Marin Olp, MD as PCP - General (Family Medicine) Ludwig Clarks, DO as Consulting Physician (Neurology) Ladene Artist, MD as Consulting Physician (Gastroenterology) Elsie Saas, MD as Consulting Physician (Orthopedic Surgery) Melida Quitter, MD as Consulting Physician (Otolaryngology)  Assessment/Plan Problem List Items Addressed This Visit    Orthostatic hypotension (Chronic)    Florinef 0.1mg  BID, Proamatine 10mg  tid, persisted orthostatic hypotension.       Hypertension (Chronic)    Persisted orthostatic hypotension.       Parkinson's disease ([place]) (Chronic)    Gait instability, falling, continue Sinemet 25/100 1.5 tab tid.       Normocytic anemia - Primary (Chronic)    04/19/15 Hgb 9.6, Vit B12 806, folate 17.8, 05/02/15 Hgb 8.7 continue FE 325mg  daily.  Hematology referral       Dehydration  (Chronic)    05/02/15 Na 142, K 4.2, Bun 18, creat 0.95      Dementia with Parkinsonism (Chronic)    04/17/15 MMSE 21/30, may consider memory preserving meds in the future.       Cerebrovascular disease (Chronic)    Small vessel disease       Constipation due to neurogenic bowel    Stable, continue fibercon daily.       Clinical depression    Flat affect noted, continue Paxil 20mg  daily.       Hypokalemia    05/02/15 K 4.2       Emphysema of lung ([facility])    Stable.           Family/ staff Communication: risk for falling. Hematology referral.   Labs/tests ordered: none  [facility] Mehr Depaoli NP Geriatrics [facility] [address], [place] [address] On Call:  [phone] & follow prompts after 5pm & weekends Office Phone:  [phone] Office Fax:  [phone]

## 2015-05-06 ENCOUNTER — Telehealth: Payer: Self-pay | Admitting: Hematology

## 2015-05-06 NOTE — Telephone Encounter (Signed)
New patient appt-s/w Rosa and gavnp appt for 11/28 @ 10:45 w/Dr. Burr Medico Referring Dr. Jeanmarie Hubert Dx- Anemia

## 2015-05-20 ENCOUNTER — Encounter: Payer: Self-pay | Admitting: Hematology

## 2015-05-20 ENCOUNTER — Ambulatory Visit (HOSPITAL_BASED_OUTPATIENT_CLINIC_OR_DEPARTMENT_OTHER): Payer: Medicare Other

## 2015-05-20 ENCOUNTER — Non-Acute Institutional Stay (SKILLED_NURSING_FACILITY): Payer: Medicare Other | Admitting: Internal Medicine

## 2015-05-20 ENCOUNTER — Ambulatory Visit (HOSPITAL_BASED_OUTPATIENT_CLINIC_OR_DEPARTMENT_OTHER): Payer: Medicare Other | Admitting: Hematology

## 2015-05-20 ENCOUNTER — Telehealth: Payer: Self-pay | Admitting: Hematology

## 2015-05-20 VITALS — BP 118/50 | HR 74 | Temp 98.2°F | Resp 17 | Ht 68.0 in | Wt 143.4 lb

## 2015-05-20 DIAGNOSIS — G3183 Dementia with Lewy bodies: Secondary | ICD-10-CM | POA: Diagnosis not present

## 2015-05-20 DIAGNOSIS — I10 Essential (primary) hypertension: Secondary | ICD-10-CM

## 2015-05-20 DIAGNOSIS — R1310 Dysphagia, unspecified: Secondary | ICD-10-CM

## 2015-05-20 DIAGNOSIS — G20 Parkinson's disease: Secondary | ICD-10-CM | POA: Diagnosis not present

## 2015-05-20 DIAGNOSIS — I951 Orthostatic hypotension: Secondary | ICD-10-CM

## 2015-05-20 DIAGNOSIS — D649 Anemia, unspecified: Secondary | ICD-10-CM | POA: Diagnosis present

## 2015-05-20 DIAGNOSIS — F329 Major depressive disorder, single episode, unspecified: Secondary | ICD-10-CM

## 2015-05-20 DIAGNOSIS — R2681 Unsteadiness on feet: Secondary | ICD-10-CM | POA: Diagnosis not present

## 2015-05-20 DIAGNOSIS — E871 Hypo-osmolality and hyponatremia: Secondary | ICD-10-CM | POA: Diagnosis not present

## 2015-05-20 DIAGNOSIS — G20A1 Parkinson's disease without dyskinesia, without mention of fluctuations: Secondary | ICD-10-CM

## 2015-05-20 DIAGNOSIS — E876 Hypokalemia: Secondary | ICD-10-CM

## 2015-05-20 DIAGNOSIS — F0280 Dementia in other diseases classified elsewhere without behavioral disturbance: Secondary | ICD-10-CM

## 2015-05-20 DIAGNOSIS — F32A Depression, unspecified: Secondary | ICD-10-CM

## 2015-05-20 DIAGNOSIS — S0100XD Unspecified open wound of scalp, subsequent encounter: Secondary | ICD-10-CM

## 2015-05-20 DIAGNOSIS — G20C Parkinsonism, unspecified: Secondary | ICD-10-CM

## 2015-05-20 DIAGNOSIS — I679 Cerebrovascular disease, unspecified: Secondary | ICD-10-CM

## 2015-05-20 DIAGNOSIS — R410 Disorientation, unspecified: Secondary | ICD-10-CM

## 2015-05-20 DIAGNOSIS — B370 Candidal stomatitis: Secondary | ICD-10-CM

## 2015-05-20 LAB — COMPREHENSIVE METABOLIC PANEL (CC13)
ALT: 14 U/L (ref 0–55)
AST: 16 U/L (ref 5–34)
Albumin: 2.9 g/dL — ABNORMAL LOW (ref 3.5–5.0)
Alkaline Phosphatase: 80 U/L (ref 40–150)
Anion Gap: 11 mEq/L (ref 3–11)
BUN: 29.2 mg/dL — AB (ref 7.0–26.0)
CHLORIDE: 103 meq/L (ref 98–109)
CO2: 29 mEq/L (ref 22–29)
Calcium: 9.2 mg/dL (ref 8.4–10.4)
Creatinine: 1.5 mg/dL — ABNORMAL HIGH (ref 0.7–1.3)
EGFR: 40 mL/min/{1.73_m2} — ABNORMAL LOW (ref >90)
GLUCOSE: 93 mg/dL (ref 70–140)
POTASSIUM: 4 meq/L (ref 3.5–5.1)
SODIUM: 142 meq/L (ref 136–145)
Total Bilirubin: <0.3 mg/dL (ref 0.20–1.20)
Total Protein: 7.2 g/dL (ref 6.4–8.3)

## 2015-05-20 LAB — MORPHOLOGY: PLT EST: INCREASED

## 2015-05-20 LAB — CBC & DIFF AND RETIC
BASO%: 0.1 % (ref 0.0–2.0)
BASOS ABS: 0 10*3/uL (ref 0.0–0.1)
EOS ABS: 0.1 10*3/uL (ref 0.0–0.5)
EOS%: 1.2 % (ref 0.0–7.0)
HEMATOCRIT: 28.6 % — AB (ref 38.4–49.9)
HEMOGLOBIN: 9.2 g/dL — AB (ref 13.0–17.1)
Immature Retic Fract: 9.8 % (ref 3.00–10.60)
LYMPH%: 19 % (ref 14.0–49.0)
MCH: 30.6 pg (ref 27.2–33.4)
MCHC: 32.2 g/dL (ref 32.0–36.0)
MCV: 95 fL (ref 79.3–98.0)
MONO#: 0.7 10*3/uL (ref 0.1–0.9)
MONO%: 10.4 % (ref 0.0–14.0)
NEUT#: 4.8 10*3/uL (ref 1.5–6.5)
NEUT%: 69.3 % (ref 39.0–75.0)
Platelets: 444 10*3/uL — ABNORMAL HIGH (ref 140–400)
RBC: 3.01 10*6/uL — ABNORMAL LOW (ref 4.20–5.82)
RDW: 13.8 % (ref 11.0–14.6)
RETIC %: 2.02 % — AB (ref 0.80–1.80)
RETIC CT ABS: 60.8 10*3/uL (ref 34.80–93.90)
WBC: 7 10*3/uL (ref 4.0–10.3)
lymph#: 1.3 10*3/uL (ref 0.9–3.3)

## 2015-05-20 LAB — LACTATE DEHYDROGENASE (CC13): LDH: 180 U/L (ref 125–245)

## 2015-05-20 MED ORDER — NYSTATIN 100000 UNIT/ML MT SUSP: 5.0000 mL | Freq: Four times a day (QID) | OROMUCOSAL | Status: AC

## 2015-05-20 MED ORDER — FERROUS SULFATE 325 (65 FE) MG PO TABS: ORAL_TABLET | ORAL | Status: AC

## 2015-05-20 NOTE — Telephone Encounter (Signed)
Appointments made and avs pritned for patient °

## 2015-05-20 NOTE — Progress Notes (Signed)
Patient ID: Darius Castro, male   DOB: 12/03/1923, 81 y.o.   MRN: [id number]    [place] Room Number: N 31  Place of Service: SNF (31)     No Known Allergies  Chief Complaint  Patient presents with  . Discharge Note    HPI:  Discharged to the care of his daughter today, [name]. He is in much better condition than on his admission 04/16/15. .  Normocytic anemia - saw hematologist today, Dr.Yan Feng. She is planning to see hi again in about 3 weeks for further treatment of his normocytic anemia.  Wound, open, scalp, subsequent encounter - healed  Acute delirium - resolved  Hypokalemia - resolved  Hyponatremia - resolved  Orthostatic hypotension - continues on medications to minimize this problem  Essential hypertension - controlled  Dysphagia - associated with Zenker's divereticulum.  Dementia with Parkinsonism - baseline MMSE 21/30  Cerebrovascular disease - noted on brain scan  Unstable gait - continues to be a risk for falling. Has improved with PT.    Medications: Patient's Medications  New Prescriptions   FERROUS SULFATE 325 (65 FE) MG TABLET    Take one tablet daily to correct anemia  Previous Medications   ASPIRIN EC 81 MG TABLET    Take 81 mg by mouth daily. To prevent stroke   BACITRACIN OINTMENT    Apply topically 2 (two) times daily. To any skin abrasion   CARBIDOPA-LEVODOPA (SINEMET IR) 25-100 MG PER TABLET    Take 1.5 tablets by mouth 3 (three) times daily.   COENZYME Q10 (CO Q-10) 100 MG CAPS    Take 100 mg by mouth 3 (three) times daily.   FLUDROCORTISONE (FLORINEF) 0.1 MG TABLET    TAKE 1 TABLET BY MOUTH EVERY DAY   KLOR-CON M10 10 MEQ TABLET    TAKE 1 TABLET (10 MEQ TOTAL) BY MOUTH DAILY.   MIDODRINE (PROAMATINE) 10 MG TABLET    Take 1 tablet (10 mg total) by mouth 3 (three) times daily.   NYSTATIN (MYCOSTATIN) 100000 UNIT/ML SUSPENSION    Take 5 mLs (500,000 Units total) by mouth 4 (four) times daily.   POLYCARBOPHIL (FIBERCON) 625 MG TABLET    Take 625 mg by mouth daily.  Modified Medications   No medications on file  Discontinued Medications   PAROXETINE (PAXIL) 20 MG TABLET    Take 20 mg by mouth daily.    Patient Care Team: Marin Olp, MD as PCP - General (Family Medicine) Ludwig Clarks, DO as Consulting Physician (Neurology) Ladene Artist, MD as Consulting Physician (Gastroenterology) Elsie Saas, MD as Consulting Physician (Orthopedic Surgery) Melida Quitter, MD as Consulting Physician (Otolaryngology) Truitt Merle, MD as Consulting Physician (Hematology)   Review of Systems  Constitutional: Negative for fever, chills and diaphoresis.  HENT: Positive for hearing loss. Negative for congestion, ear discharge, ear pain, nosebleeds and sore throat.   Eyes: Negative for pain, discharge and redness.  Respiratory: Negative for cough and shortness of breath.   Cardiovascular: Negative for chest pain, palpitations and leg swelling.  Gastrointestinal: Negative for nausea, vomiting, abdominal pain, diarrhea and constipation.  Genitourinary: Negative for dysuria and urgency.  Musculoskeletal: Positive for gait problem. Negative for myalgias, back pain and neck pain.  Skin: Negative for rash. Color change: ecchymoses.  Allergic/Immunologic: Negative for environmental allergies.  Neurological: Negative for dizziness, tremors, weakness and headaches.  Hematological: Does not bruise/bleed easily.  Psychiatric/Behavioral: The patient is not nervous/anxious.  04/17/15 MMSE 21/30    Filed Vitals:   05/20/15 1454  BP: 142/68  Pulse: 72  Temp: 98.2 F (36.8 C)  Resp: 16  Height: _0  (1.727 m)  Weight: 149 lb 4.8 oz (67.722 kg)  SpO2: 96%   Body mass index is 22.71 kg/(m^2).  Physical Exam  Constitutional: He is oriented to person, place, and time. He appears well-developed and well-nourished. No distress.  HENT:  Right Ear: External ear normal.  Left Ear: External ear  normal.  Nose: Nose normal.  Mouth/Throat: Oropharynx is clear and moist. No oropharyngeal exudate.  Eyes: Conjunctivae and EOM are normal. Pupils are equal, round, and reactive to light.  Neck: No JVD present. No tracheal deviation present. No thyromegaly present.  Cardiovascular: Normal rate, regular rhythm, normal heart sounds and intact distal pulses.  Exam reveals no gallop and no friction rub.   No murmur heard. Pulmonary/Chest: No respiratory distress. He has no wheezes. He has no rales. He exhibits no tenderness.  Abdominal: He exhibits no distension and no mass. There is no tenderness.  Musculoskeletal: Normal range of motion. He exhibits edema. He exhibits no tenderness.  Wearing compression stockings bilaterally. Unstable gait.  Lymphadenopathy:    He has no cervical adenopathy.  Neurological: He is alert and oriented to person, place, and time. He has normal reflexes. No cranial nerve deficit. Coordination normal.  Cognitive deficits.  Skin: No rash noted. No erythema. No pallor.  Psychiatric: He has a normal mood and affect. His behavior is normal. Judgment and thought content normal.     Labs reviewed: Lab Summary Latest Ref Rng 05/20/2015 05/02/2015 04/21/2015  Hemoglobin 13.0 - 17.1 g/dL 9.2(L) 8.7(A) 10.1(L)  Hematocrit 38.4 - 49.9 % 28.6(L) 26(A) 29.4(L)  White count 4.0 - 10.3 10e3/uL 7.0 6.8 8.5  Platelet count 140 - 400 10e3/uL 444(H) 366 335  Sodium 136 - 145 mEq/L 142 142 136  Potassium 3.5 - 5.1 mEq/L 4.0 4.2 3.4(L)  Calcium 8.4 - 10.4 mg/dL 9.2 (None) 8.8(L)  Phosphorus - (None) (None) (None)  Creatinine 0.7 - 1.3 mg/dL 1.5(H) 1.0 0.97  AST 5 - 34 U/L 16 (None) 29  Alk Phos 40 - 150 U/L 80 (None) 76  Bilirubin 0.20 - 1.20 mg/dL <0.30 (None) 0.5  Glucose 70 - 140 mg/dl 93 97 109(H)  Cholesterol - (None) (None) (None)  HDL cholesterol - (None) (None) (None)  Triglycerides - (None) (None) (None)  LDL Direct - (None) (None) (None)  LDL Calc - (None) (None)  (None)  Total protein 6.4 - 8.3 g/dL 7.2 (None) 6.5  Albumin 3.5 - 5.0 g/dL 2.9(L) (None) 3.9   Lab Results  Component Value Date   TSH 3.19 04/19/2015   Lab Results  Component Value Date   BUN 29.2* 05/20/2015   Lab Results  Component Value Date   HGBA1C 6.2 05/30/2012       Assessment/Plan  1. Normocytic anemia continue to see hematologist, Dr. Truitt Merle - ferrous sulfate 325 (65 FE) MG tablet; Take one tablet daily to correct anemia  Dispense: 30 tablet; Refill: 3  2. Wound, open, scalp, subsequent encounter healed  3. Acute delirium resolved  4. Hypokalemia resolved  5. Hyponatremia resolved  6. Orthostatic hypotension Controlled with medication and compression stockings  7. Essential hypertension controlled  8. Dysphagia monitor  9. Dementia with Parkinsonism monitor  10. Cerebrovascular disease Noted on brain scan  11. Unstable gait Fall risk  12. Parkinson's disease ([place]) Controlled with medication. Fall risk  13.  Clinical depression Stable. Off Paroxetine due to impression that it confused him and made gait more unstable. May also have worsened tremor and caused hyponatremia.

## 2015-05-20 NOTE — Progress Notes (Signed)
[facility]  Telephone:(336) [phone] Fax:([phone]) [place] consult Note   Patient Care Team: Marin Olp, MD as PCP - General (Family Medicine) Ludwig Clarks, DO as Consulting Physician (Neurology) Ladene Artist, MD as Consulting Physician (Gastroenterology) Elsie Saas, MD as Consulting Physician (Orthopedic Surgery) Melida Quitter, MD as Consulting Physician (Otolaryngology) 05/20/2015  Referring physician: Dr. Nyoka Cowden at [facility]:  Anemia   HISTORY OF PRESENTING ILLNESS:  Darius Castro 81 y.o. male with past medical history of Parkinson disease, dementia, prostate cancer, orthostatic hypotension, is here because of anemia. Patient is not very conversational, answers questions slowly, history was mainly obtained from his daughter and son-in-law who accompanied him to the clinic today.  He had right hip surgery in 2013, and developed mild anemia with Hb around 12 since then. His anemia has gotten worse in the past several months, hemoglobin has been fluctuating in the range of 8.5-10.5 lately. He denied any bleeding episodes including hematochezia, melana, hemoptysis, hematuria or epitaxis. No mucosal bleeding or easy bruising.   His main complain is dizziness expecially when he gets up. He had multiple fall and hit his head in the past few months, which required stichces, and he has been mainly bed/wheelchair bound since then. His appetie is moderate, usuallya ble to fininish his plate. He was diagnosed with Parkinson disease about 2 years ago, and his cognitive function deteriorated significantly in the past year. He has a very a long-term resident at [facility], but his daughter has decided to take him home today. He is currently living with his son, and his daughter lives close to him.  He denies recent chest pain on exertion, shortness of breath on minimal exertion, pre-syncopal episodes, or  palpitations. He had not noticed any recent bleeding such as epistaxis, hematuria or hematochezia The patient denies over the counter NSAID ingestion. He is on low dose aspirin every other day. His last colonoscopy was 2011 which showed a few colon polyps. He had no prior history or diagnosis of cancer except prostate cancer status post radial seed implant in 2008. There is no evidence of cancer recurrence so far. His age appropriate screening programs are up-to-date. He denies any pica and eats a variety of diet. He never donated blood or received blood transfusion The patient was prescribed oral iron supplements and he takes ferous sulfate 1 tab twice daily for the past one month.   MEDICAL HISTORY:  Past Medical History  Diagnosis Date  . Hyperlipidemia     takes Lipitor daily  . Adenomatous colon polyp 3/09  . Personal history of prostate cancer   . Dysphagia     with pills  . Hypertension     takes Fosinopril daily  . Sleep apnea     sleep study done in 03/30/04 in epic;doesn't use a CPAP  . Pneumonia     hx of as a child  . Arthritis     back and hip  . Dizziness     pt states when he is hot and stands up too fast  . Back pain     hx of ruptured disc  . Dry skin   . Hemorrhoid     internal   . Hx of colonic polyps   . Nocturia   . Prostate cancer ([place])   . Impaired hearing   . 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 Diabetes mellitus     patient denies Diabetes  .  COLONIC POLYPS, HX OF 11/05/2008    Qualifier: Diagnosis of  By: Julaine Hua CMA ([place]), Estill Bamberg    . History of fall 04/08/2015  . Dyslipidemia 04/22/2015  . Dysphagia 04/22/2015  . Obstructive sleep apnea 04/22/2015  . HOH (hard of hearing) 04/22/2015  . Unstable gait 04/22/2015  . Clinical depression 04/19/2015    SURGICAL HISTORY: Past Surgical History  Procedure Laterality Date  . Lumbar laminectomy  1998  . Knee arthroscopy  2001  . Rotator cuff repair Right 2/10  . Cataract extraction  Bilateral   . Tonsillectomy      as a child  . Colonoscopy    . Radioactive seed implant  2008  . Total hip arthroplasty  08/24/2011    Procedure: TOTAL HIP ARTHROPLASTY;  Surgeon: Lorn Junes, MD;  Location: [facility];  Service: Orthopedics;  Laterality: Right;  DR [place]  . Zenker's diverticulectomy  09/02/2011    Procedure: ZENKER'S DIVERTICULECTOMY;  Surgeon: Melida Quitter, MD;  Location: [facility] OR;  Service: ENT;  Laterality: N/A;    SOCIAL HISTORY: Social History   Social History  . Marital Status: Married    Spouse Name: N/A  . Number of Children: 3  . Years of Education: N/A   Occupational History  . retired    Social History Main Topics  . Smoking status: Former Smoker    Quit date: 06/22/1961  . Smokeless tobacco: Never Used  . Alcohol Use: Yes     Comment: red wine 3-4 times/wk  . Drug Use: No  . Sexual Activity: Not Currently   Other Topics Concern  . Not on file   Social History Narrative    FAMILY HISTORY: Family History  Problem Relation Age of Onset  . Heart disease Father   . Heart attack Father 34  . Colon polyps Daughter   . Colon cancer Neg Hx   . Anesthesia problems Neg Hx   . Hypotension Neg Hx   . Malignant hyperthermia Neg Hx   . Pseudochol deficiency Neg Hx   . Hypertension Mother   . Cancer Sister     ALLERGIES:  has No Known Allergies.  MEDICATIONS:  Current Outpatient Prescriptions  Medication Sig Dispense Refill  . bacitracin ointment Apply topically 2 (two) times daily. To any skin abrasion (Patient not taking: Reported on 04/21/2015) 120 g 0  . carbidopa-levodopa (SINEMET IR) 25-100 MG per tablet Take 1.5 tablets by mouth 3 (three) times daily. 405 tablet 3  . Coenzyme Q10 (CO Q-10) 100 MG CAPS Take 100 mg by mouth 3 (three) times daily.    . fludrocortisone (FLORINEF) 0.1 MG tablet TAKE 1 TABLET BY MOUTH EVERY DAY 90 tablet 1  . KLOR-CON M10 10 MEQ tablet TAKE 1 TABLET (10 MEQ TOTAL) BY MOUTH DAILY. 90  tablet 3  . midodrine (PROAMATINE) 10 MG tablet Take 1 tablet (10 mg total) by mouth 3 (three) times daily. 270 tablet 6  . polycarbophil (FIBERCON) 625 MG tablet Take 625 mg by mouth daily.     No current facility-administered medications for this visit.   Facility-Administered Medications Ordered in Other Visits  Medication Dose Route Frequency Provider Last Rate Last Dose  . povidone-iodine (BETADINE) 7.5 % scrub   Topical Once Kirstin Shepperson, PA-C        REVIEW OF SYSTEMS:   Constitutional: Denies fevers, chills or abnormal night sweats Eyes: Denies blurriness of vision, double vision or watery eyes Ears, nose, mouth, throat, and face: Denies mucositis  or sore throat Respiratory: Denies cough, dyspnea or wheezes Cardiovascular: Denies palpitation, chest discomfort or lower extremity swelling Gastrointestinal:  Denies nausea, heartburn or change in bowel habits Skin: Denies abnormal skin rashes Lymphatics: Denies new lymphadenopathy or easy bruising Neurological:Denies numbness, tingling or new weaknesses, (+) dizziness, (+) tremor  Behavioral/Psych: Mood is stable, no new changes  All other systems were reviewed with the patient and are negative.  PHYSICAL EXAMINATION: ECOG PERFORMANCE STATUS: 4 - Bedbound  Filed Vitals:   05/20/15 1126  BP: 118/50  Pulse: 74  Temp: 98.2 F (36.8 C)  Resp: 17   Filed Weights   05/20/15 1126  Weight: 143 lb 6.4 oz (65.046 kg)    GENERAL:alert, no distress and comfortable SKIN: skin color, texture, turgor are normal, no rashes or significant lesions EYES: normal, conjunctiva are pink and non-injected, sclera clear OROPHARYNX:no exudate, no erythema and lips, buccal mucosa, and tongue normal  NECK: supple, thyroid normal size, non-tender, without nodularity LYMPH:  no palpable lymphadenopathy in the cervical, axillary or inguinal LUNGS: clear to auscultation and percussion with normal breathing effort HEART: regular rate & rhythm  and no murmurs and no lower extremity edema ABDOMEN:abdomen soft, non-tender and normal bowel sounds Musculoskeletal:no cyanosis of digits and no clubbing  PSYCH: alert & oriented x 3 with fluent speech NEURO: no focal motor/sensory deficits  LABORATORY DATA:  I have reviewed the data as listed CBC Latest Ref Rng 05/02/2015 04/21/2015 04/19/2015  WBC - 6.8 8.5 7.8  Hemoglobin 13.5 - 17.5 g/dL 8.7(A) 10.1(L) 9.4(A)  Hematocrit 41 - 53 % 26(A) 29.4(L) 28(A)  Platelets 150 - 399 K/L 366 335 324    CMP Latest Ref Rng 05/02/2015 04/21/2015 04/19/2015  Glucose 65 - 99 mg/dL - 109(H) -  BUN 4 - 21 mg/dL 18 23(H) 30(A)  Creatinine 0.6 - 1.3 mg/dL 1.0 0.97 1.0  Sodium 137 - 147 mmol/L 142 136 130(A)  Potassium 3.4 - 5.3 mmol/L 4.2 3.4(L) 3.5  Chloride 101 - 111 mmol/L - 94(L) -  CO2 22 - 32 mmol/L - 33(H) -  Calcium 8.9 - 10.3 mg/dL - 8.8(L) -  Total Protein 6.5 - 8.1 g/dL - 6.5 -  Total Bilirubin 0.3 - 1.2 mg/dL - 0.5 -  Alkaline Phos 38 - 126 U/L - 76 67  AST 15 - 41 U/L - 29 26  ALT 17 - 63 U/L - 12(L) 21     RADIOGRAPHIC STUDIES: I have personally reviewed the radiological images as listed and agreed with the findings in the report. Ct Head Wo Contrast  04/22/2015  CLINICAL DATA:  Dizziness and falls for past few days. History of diabetes, hypertension, prostate cancer. EXAM: CT HEAD WITHOUT CONTRAST TECHNIQUE: Contiguous axial images were obtained from the base of the skull through the vertex without intravenous contrast. COMPARISON:  CT head and cervical spine April 13, 2015 FINDINGS: Moderate to severe ventriculomegaly, predominately on the basis of global parenchymal brain volume loss, stable from prior imaging. Mild sulcal effacement of the convexities. Patchy to confluent supratentorial white matter hypodensities are unchanged. No midline shift, mass effect, mass lesions or acute large vascular territory infarct. No abnormal extra-axial fluid collections. Basal cisterns are  patent. Mild to moderate calcific atherosclerosis of the carotid siphons. Small residual LEFT frontal scalp hematoma and suspected laceration. Small RIGHT parietal scalp hematoma, subcutaneous gas and skin staples. No skull fracture. Calcified pannus about the odontoid process most often seen with CPPD. The included ocular globes and orbital contents are non-suspicious. Status post  bilateral ocular lens implants. Mild LEFT maxillary sinus mucosal thickening without air-fluid levels. Mastoid air cells are well aerated. IMPRESSION: No acute intracranial process. Multiple small scalp hematomas. No skull fracture. 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 Electronically Signed   By: Elon Alas M.D.   On: 04/22/2015 01:49    ASSESSMENT & PLAN:  81 year old Caucasian male with past medical history of prostate cancer in [place], in remission, Parkinson disease, dementia, orthostatic hypotension, multiple for at home, presents for anemia evaluation  1. Normocytic anemia -His repeat hemoglobin today is 9.2, with MCV 95. His absolute reticulocyte count is 60.8, which is low. He has hypoproductive anemia  -his previous lab test showed normal C37, folic acid, normal ferritin at 151, low serum iron level at 34, saturation 11%, normal TIBC.  -His CMP today showed Cr 1.5, with a GFR around 40. His Cr previously was normal at 0.93 on 04/18/2059 -He likely has component of anemia of chronic disease (from renal insufficiency and other medical conditions), iron deficiency. He may have low testosterone level from his previous prostate cancer.  -I'll check his testosterone level, LDH, SPEP with immunofixation, light chain levels, methylmalonic acid, and erythropoietin level today. -Primary bone marrow disease, such as MDS, is certainly a possibility. Given his advanced stage and comorbidities, I may hold bone marrow biopsy  unless I see abnormal cells or dyspoiesis on his peripheral blood smear. -I discussed the role of erythropoietin stimulus agent for his chronic anemia, potential side effects, especially thrombosis, including stroke and heart attack were discussed with patient, and his family members. I discussed that his fatigue may improve after anemia treatment, however his dizziness may not improve much, which I do not think is mainly from his anemia. -After lengthy discussion, the patient's daughter, who is his power of attorney, agrees with the above and treatment planning with Aranesp  2. Parkinson's disease, hypertension, dementia, -He will continue follow-up with his primary care physician, cardiologist and neurologist.    Plan -lab today -RTC in 2-3 weeks with first dose of aranesp    All questions were answered. The patient knows to call the clinic with any problems, questions or concerns. I spent 40 minutes counseling the patient face to face. The total time spent in the appointment was 50 minutes and more than 50% was on counseling.     Truitt Merle, MD 05/20/2015 12:05 PM

## 2015-05-21 ENCOUNTER — Encounter: Payer: Self-pay | Admitting: Hematology

## 2015-05-21 DIAGNOSIS — D649 Anemia, unspecified: Secondary | ICD-10-CM | POA: Insufficient documentation

## 2015-05-22 NOTE — Telephone Encounter (Signed)
Mr. Zoss has been discharged from the skilled nursing facility. His daughter is moving him to be with her in her home which I believe is in the [place] area. She will likely be seeking to get him established with her physician. He has been seen by oncology in [place]. She plans to continue care of his anemia there.

## 2015-05-23 LAB — SPEP & IFE WITH QIG
Abnormal Protein Band1: 0.1 g/dL
Albumin ELP: 3 g/dL — ABNORMAL LOW (ref 3.8–4.8)
Alpha-1-Globulin: 0.6 g/dL — ABNORMAL HIGH (ref 0.2–0.3)
Alpha-2-Globulin: 1.1 g/dL — ABNORMAL HIGH (ref 0.5–0.9)
BETA GLOBULIN: 0.4 g/dL (ref 0.4–0.6)
Beta 2: 0.4 g/dL (ref 0.2–0.5)
Gamma Globulin: 0.7 g/dL — ABNORMAL LOW (ref 0.8–1.7)
IGA: 179 mg/dL (ref 68–379)
IGM, SERUM: 27 mg/dL — AB (ref 41–251)
IgG (Immunoglobin G), Serum: 804 mg/dL (ref 650–1600)
Total Protein, Serum Electrophoresis: 6.1 g/dL (ref 6.1–8.1)

## 2015-05-23 LAB — KAPPA/LAMBDA LIGHT CHAINS
KAPPA LAMBDA RATIO: 1.77 — AB (ref 0.26–1.65)
Kappa free light chain: 5.26 mg/dL — ABNORMAL HIGH (ref 0.33–1.94)
LAMBDA FREE LGHT CHN: 2.98 mg/dL — AB (ref 0.57–2.63)

## 2015-05-23 LAB — TESTOSTERONE: TESTOSTERONE: 183 ng/dL — AB (ref 300–890)

## 2015-05-23 LAB — ERYTHROPOIETIN: ERYTHROPOIETIN: 16.7 m[IU]/mL (ref 2.6–18.5)

## 2015-05-23 LAB — TRANSFERRIN RECEPTOR, SOLUABLE: Transferrin Receptor, Soluble: 1.41 mg/L (ref 0.76–1.76)

## 2015-05-23 LAB — METHYLMALONIC ACID, SERUM: METHYLMALONIC ACID, QUANT: 185 nmol/L (ref 87–318)

## 2015-06-11 ENCOUNTER — Ambulatory Visit (HOSPITAL_BASED_OUTPATIENT_CLINIC_OR_DEPARTMENT_OTHER): Payer: Medicare Other | Admitting: Hematology

## 2015-06-11 ENCOUNTER — Ambulatory Visit: Payer: Medicare Other

## 2015-06-11 ENCOUNTER — Telehealth: Payer: Self-pay | Admitting: Hematology

## 2015-06-11 ENCOUNTER — Encounter: Payer: Self-pay | Admitting: Hematology

## 2015-06-11 ENCOUNTER — Other Ambulatory Visit (HOSPITAL_BASED_OUTPATIENT_CLINIC_OR_DEPARTMENT_OTHER): Payer: Medicare Other

## 2015-06-11 VITALS — BP 89/36 | HR 69 | Temp 97.8°F | Resp 18 | Ht 68.0 in

## 2015-06-11 DIAGNOSIS — D649 Anemia, unspecified: Secondary | ICD-10-CM

## 2015-06-11 DIAGNOSIS — D509 Iron deficiency anemia, unspecified: Secondary | ICD-10-CM

## 2015-06-11 DIAGNOSIS — G3183 Dementia with Lewy bodies: Principal | ICD-10-CM

## 2015-06-11 DIAGNOSIS — Z8546 Personal history of malignant neoplasm of prostate: Secondary | ICD-10-CM

## 2015-06-11 DIAGNOSIS — D472 Monoclonal gammopathy: Secondary | ICD-10-CM

## 2015-06-11 DIAGNOSIS — F0280 Dementia in other diseases classified elsewhere without behavioral disturbance: Secondary | ICD-10-CM

## 2015-06-11 LAB — CBC & DIFF AND RETIC
BASO%: 0.1 % (ref 0.0–2.0)
Basophils Absolute: 0 10*3/uL (ref 0.0–0.1)
EOS%: 1.5 % (ref 0.0–7.0)
Eosinophils Absolute: 0.1 10*3/uL (ref 0.0–0.5)
HCT: 32.9 % — ABNORMAL LOW (ref 38.4–49.9)
HGB: 10.6 g/dL — ABNORMAL LOW (ref 13.0–17.1)
Immature Retic Fract: 7.9 % (ref 3.00–10.60)
LYMPH%: 15.4 % (ref 14.0–49.0)
MCH: 30.8 pg (ref 27.2–33.4)
MCHC: 32.2 g/dL (ref 32.0–36.0)
MCV: 95.6 fL (ref 79.3–98.0)
MONO#: 0.4 10*3/uL (ref 0.1–0.9)
MONO%: 4.9 % (ref 0.0–14.0)
NEUT%: 78.1 % — ABNORMAL HIGH (ref 39.0–75.0)
NEUTROS ABS: 6.6 10*3/uL — AB (ref 1.5–6.5)
PLATELETS: 359 10*3/uL (ref 140–400)
RBC: 3.44 10*6/uL — AB (ref 4.20–5.82)
RDW: 14.6 % (ref 11.0–14.6)
Retic %: 1.7 % (ref 0.80–1.80)
Retic Ct Abs: 58.48 10*3/uL (ref 34.80–93.90)
WBC: 8.5 10*3/uL (ref 4.0–10.3)
lymph#: 1.3 10*3/uL (ref 0.9–3.3)

## 2015-06-11 MED ORDER — ZOLPIDEM TARTRATE 5 MG PO TABS: 5.0000 mg | ORAL_TABLET | Freq: Every evening | ORAL | Status: AC | PRN

## 2015-06-11 NOTE — Patient Instructions (Signed)
Darbepoetin Alfa injection What is this medicine? DARBEPOETIN ALFA (dar be POE e tin AL fa) helps your body make more red blood cells. It is used to treat anemia caused by chronic kidney failure and chemotherapy. This medicine may be used for other purposes; ask your health care provider or pharmacist if you have questions. What should I tell my health care provider before I take this medicine? They need to know if you have any of these conditions: -blood clotting disorders or history of blood clots -cancer patient not on chemotherapy -cystic fibrosis -heart disease, such as angina, heart failure, or a history of a heart attack -hemoglobin level of 12 g/dL or greater -high blood pressure -low levels of folate, iron, or vitamin B12 -seizures -an unusual or allergic reaction to darbepoetin, erythropoietin, albumin, hamster proteins, latex, other medicines, foods, dyes, or preservatives -pregnant or trying to get pregnant -breast-feeding How should I use this medicine? This medicine is for injection into a vein or under the skin. It is usually given by a health care professional in a hospital or clinic setting. If you get this medicine at home, you will be taught how to prepare and give this medicine. Do not shake the solution before you withdraw a dose. Use exactly as directed. Take your medicine at regular intervals. Do not take your medicine more often than directed. It is important that you put your used needles and syringes in a special sharps container. Do not put them in a trash can. If you do not have a sharps container, call your pharmacist or healthcare provider to get one. Talk to your pediatrician regarding the use of this medicine in children. While this medicine may be used in children as young as 1 year for selected conditions, precautions do apply. Overdosage: If you think you have taken too much of this medicine contact a poison control center or emergency room at once. NOTE:  This medicine is only for you. Do not share this medicine with others. What if I miss a dose? If you miss a dose, take it as soon as you can. If it is almost time for your next dose, take only that dose. Do not take double or extra doses. What may interact with this medicine? Do not take this medicine with any of the following medications: -epoetin alfa This list may not describe all possible interactions. Give your health care provider a list of all the medicines, herbs, non-prescription drugs, or dietary supplements you use. Also tell them if you smoke, drink alcohol, or use illegal drugs. Some items may interact with your medicine. What should I watch for while using this medicine? Visit your prescriber or health care professional for regular checks on your progress and for the needed blood tests and blood pressure measurements. It is especially important for the doctor to make sure your hemoglobin level is in the desired range, to limit the risk of potential side effects and to give you the best benefit. Keep all appointments for any recommended tests. Check your blood pressure as directed. Ask your doctor what your blood pressure should be and when you should contact him or her. As your body makes more red blood cells, you may need to take iron, folic acid, or vitamin B supplements. Ask your doctor or health care provider which products are right for you. If you have kidney disease continue dietary restrictions, even though this medication can make you feel better. Talk with your doctor or health care professional about the   foods you eat and the vitamins that you take. What side effects may I notice from receiving this medicine? Side effects that you should report to your doctor or health care professional as soon as possible: -allergic reactions like skin rash, itching or hives, swelling of the face, lips, or tongue -breathing problems -changes in vision -chest pain -confusion, trouble speaking  or understanding -feeling faint or lightheaded, falls -high blood pressure -muscle aches or pains -pain, swelling, warmth in the leg -rapid weight gain -severe headaches -sudden numbness or weakness of the face, arm or leg -trouble walking, dizziness, loss of balance or coordination -seizures (convulsions) -swelling of the ankles, feet, hands -unusually weak or tired Side effects that usually do not require medical attention (report to your doctor or health care professional if they continue or are bothersome): -diarrhea -fever, chills (flu-like symptoms) -headaches -nausea, vomiting -redness, stinging, or swelling at site where injected This list may not describe all possible side effects. Call your doctor for medical advice about side effects. You may report side effects to FDA at 1-800-FDA-1088. Where should I keep my medicine? Keep out of the reach of children. Store in a refrigerator between 2 and 8 degrees C (36 and 46 degrees F). Do not freeze. Do not shake. Throw away any unused portion if using a single-dose vial. Throw away any unused medicine after the expiration date. NOTE: This sheet is a summary. It may not cover all possible information. If you have questions about this medicine, talk to your doctor, pharmacist, or health care provider.    2016, Elsevier/Gold Standard. (2008-05-22 10:23:57)  

## 2015-06-11 NOTE — Telephone Encounter (Signed)
Scheduled appt with patient while he was here in person at [facility] Scheduling office.       [facility]. °

## 2015-06-11 NOTE — Progress Notes (Signed)
[facility]  Telephone:(336) [phone] [phone]  Clinic follow Up Note   Patient Care Team: Marin Olp, MD as PCP - General (Family Medicine) Ludwig Clarks, DO as Consulting Physician (Neurology) Ladene Artist, MD as Consulting Physician (Gastroenterology) Elsie Saas, MD as Consulting Physician (Orthopedic Surgery) Melida Quitter, MD as Consulting Physician (Otolaryngology) Truitt Merle, MD as Consulting Physician (Hematology) 06/11/2015   CHIEF COMPLAINTS:  Follow up anemia   HISTORY OF PRESENTING ILLNESS (05/20/2015):  Darius Castro 81 y.o. male with past medical history of Parkinson disease, dementia, prostate cancer, orthostatic hypotension, is here because of anemia. Patient is not very conversational, answers questions slowly, history was mainly obtained from his daughter and son-in-law who accompanied him to the clinic today.  He had right hip surgery in 2013, and developed mild anemia with Hb around 12 since then. His anemia has gotten worse in the past several months, hemoglobin has been fluctuating in the range of 8.5-10.5 lately. He denied any bleeding episodes including hematochezia, melana, hemoptysis, hematuria or epitaxis. No mucosal bleeding or easy bruising.   His main complain is dizziness expecially when he gets up. He had multiple fall and hit his head in the past few months, which required stichces, and he has been mainly bed/wheelchair bound since then. His appetie is moderate, usuallya ble to fininish his plate. He was diagnosed with Parkinson disease about 2 years ago, and his cognitive function deteriorated significantly in the past year. He has a very a long-term resident at [facility], but his daughter has decided to take him home today. He is currently living with his son, and his daughter lives close to him.  He denies recent chest pain on exertion, shortness of breath on minimal exertion, pre-syncopal episodes, or  palpitations. He had not noticed any recent bleeding such as epistaxis, hematuria or hematochezia The patient denies over the counter NSAID ingestion. He is on low dose aspirin every other day. His last colonoscopy was 2011 which showed a few colon polyps. He had no prior history or diagnosis of cancer except prostate cancer status post radial seed implant in 2008. There is no evidence of cancer recurrence so far. His age appropriate screening programs are up-to-date. He denies any pica and eats a variety of diet. He never donated blood or received blood transfusion The patient was prescribed oral iron supplements and he takes ferous sulfate 1 tab twice daily for the past one month.   CURRENT TREATMENT: Observation  INTERIM HISTORY: Darius Castro returns for follow-up. He is accompanied by his son and son-in-law. According to his family members, he has been doing better since he moved [place] home to his son's house. His son stays with him, he eats better lately, and seems to have bitter more nergy, more alert lately. Since his last visit with me, he did increase his oral iron pill to 2 tablets daily, and is able tolerate well. The only issue is his sleep. He sleeps quite a bit during day, and does not sleep much at night. His son has to stay up to watch him and night. Other family members has been coming to his house to help in taking care of him also. He spends most of time in wheelchair and bed, not physically active.  MEDICAL HISTORY:  Past Medical History  Diagnosis Date  . Hyperlipidemia     takes Lipitor daily  . Adenomatous colon polyp 3/09  . Personal history of prostate cancer   .  Dysphagia     with pills  . Hypertension     takes Fosinopril daily  . Sleep apnea     sleep study done in 03/30/04 in epic;doesn't use a CPAP  . Pneumonia     hx of as a child  . Arthritis     back and hip  . Dizziness     pt states when he is hot and stands up too fast  . Back pain     hx of  ruptured disc  . Dry skin   . Hemorrhoid     internal   . Hx of colonic polyps   . Nocturia   . Prostate cancer ([place])   . Impaired hearing   . 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 Diabetes mellitus     patient denies Diabetes  . COLONIC POLYPS, HX OF 11/05/2008    Qualifier: Diagnosis of  By: Julaine Hua CMA ([place]), Estill Bamberg    . History of fall 04/08/2015  . Dyslipidemia 04/22/2015  . Dysphagia 04/22/2015  . Obstructive sleep apnea 04/22/2015  . HOH (hard of hearing) 04/22/2015  . Unstable gait 04/22/2015  . Clinical depression 04/19/2015    SURGICAL HISTORY: Past Surgical History  Procedure Laterality Date  . Lumbar laminectomy  1998  . Knee arthroscopy  2001  . Rotator cuff repair Right 2/10  . Cataract extraction Bilateral   . Tonsillectomy      as a child  . Colonoscopy    . Radioactive seed implant  2008  . Total hip arthroplasty  08/24/2011    Procedure: TOTAL HIP ARTHROPLASTY;  Surgeon: Lorn Junes, MD;  Location: [facility];  Service: Orthopedics;  Laterality: Right;  DR [place]  . Zenker's diverticulectomy  09/02/2011    Procedure: ZENKER'S DIVERTICULECTOMY;  Surgeon: Melida Quitter, MD;  Location[facility];  Service: ENT;  Laterality: N/A;    SOCIAL HISTORY: Social History   Social History  . Marital Status: Married    Spouse Name: N/A  . Number of Children: 3  . Years of Education: N/A   Occupational History  . retired    Social History Main Topics  . Smoking status: Former Smoker -- 0 years    Quit date: 06/22/1961  . Smokeless tobacco: Never Used  . Alcohol Use: No     Comment: he used to drink red wine 3-4 times/wk, stopped 2 years ago   . Drug Use: No  . Sexual Activity: Not Currently   Other Topics Concern  . Not on file   Social History Narrative    FAMILY HISTORY: Family History  Problem Relation Age of Onset  . Heart disease Father   . Heart attack Father 27  . Colon polyps Daughter   . Colon cancer  Neg Hx   . Anesthesia problems Neg Hx   . Hypotension Neg Hx   . Malignant hyperthermia Neg Hx   . Pseudochol deficiency Neg Hx   . Hypertension Mother   . Cancer Sister     ALLERGIES:  has No Known Allergies.  MEDICATIONS:  Current Outpatient Prescriptions  Medication Sig Dispense Refill  . aspirin EC 81 MG tablet Take 81 mg by mouth daily. To prevent stroke    . carbidopa-levodopa (SINEMET IR) 25-100 MG per tablet Take 1.5 tablets by mouth 3 (three) times daily. 405 tablet 3  . Coenzyme Q10 (CO Q-10) 100 MG CAPS Take 100 mg by mouth 3 (three) times daily.    . ferrous  sulfate 325 (65 FE) MG tablet Take one tablet daily to correct anemia 30 tablet 3  . fludrocortisone (FLORINEF) 0.1 MG tablet TAKE 1 TABLET BY MOUTH EVERY DAY 90 tablet 1  . KLOR-CON M10 10 MEQ tablet TAKE 1 TABLET (10 MEQ TOTAL) BY MOUTH DAILY. 90 tablet 3  . midodrine (PROAMATINE) 10 MG tablet Take 1 tablet (10 mg total) by mouth 3 (three) times daily. 270 tablet 6  . nystatin (MYCOSTATIN) 100000 UNIT/ML suspension Take 5 mLs (500,000 Units total) by mouth 4 (four) times daily. 473 mL 0  . polycarbophil (FIBERCON) 625 MG tablet Take 625 mg by mouth daily.     No current facility-administered medications for this visit.   Facility-Administered Medications Ordered in Other Visits  Medication Dose Route Frequency Provider Last Rate Last Dose  . povidone-iodine (BETADINE) 7.5 % scrub   Topical Once Kirstin Shepperson, PA-C        REVIEW OF SYSTEMS:   Constitutional: Denies fevers, chills or abnormal night sweats Eyes: Denies blurriness of vision, double vision or watery eyes Ears, nose, mouth, throat, and face: Denies mucositis or sore throat Respiratory: Denies cough, dyspnea or wheezes Cardiovascular: Denies palpitation, chest discomfort or lower extremity swelling Gastrointestinal:  Denies nausea, heartburn or change in bowel habits Skin: Denies abnormal skin rashes Lymphatics: Denies new lymphadenopathy or  easy bruising Neurological:Denies numbness, tingling or new weaknesses, (+) dizziness, (+) tremor  Behavioral/Psych: Mood is stable, no new changes  All other systems were reviewed with the patient and are negative.  PHYSICAL EXAMINATION: ECOG PERFORMANCE STATUS: 4 - Bedbound  Filed Vitals:   06/11/15 1311  BP: 89/36  Pulse: 69  Temp: 97.8 F (36.6 C)  Resp: 18   Filed Weights    GENERAL:alert, no distress and comfortable SKIN: skin color, texture, turgor are normal, no rashes or significant lesions EYES: normal, conjunctiva are pink and non-injected, sclera clear OROPHARYNX:no exudate, no erythema and lips, buccal mucosa, and tongue normal  NECK: supple, thyroid normal size, non-tender, without nodularity LYMPH:  no palpable lymphadenopathy in the cervical, axillary or inguinal LUNGS: clear to auscultation and percussion with normal breathing effort HEART: regular rate & rhythm and no murmurs and no lower extremity edema ABDOMEN:abdomen soft, non-tender and normal bowel sounds Musculoskeletal:no cyanosis of digits and no clubbing  PSYCH: alert & oriented x 3 with fluent speech NEURO: no focal motor/sensory deficits  LABORATORY DATA:  I have reviewed the data as listed CBC Latest Ref Rng 06/11/2015 05/20/2015 05/02/2015  WBC 4.0 - 10.3 10e3/uL 8.5 7.0 6.8  Hemoglobin 13.0 - 17.1 g/dL 10.6(L) 9.2(L) 8.7(A)  Hematocrit 38.4 - 49.9 % 32.9(L) 28.6(L) 26(A)  Platelets 140 - 400 10e3/uL 359 444(H) 366    CMP Latest Ref Rng 05/20/2015 05/02/2015 04/21/2015  Glucose 70 - 140 mg/dl 93 - 109(H)  BUN 7.0 - 26.0 mg/dL 29.2(H) 18 23(H)  Creatinine 0.7 - 1.3 mg/dL 1.5(H) 1.0 0.97  Sodium 136 - 145 mEq/L 142 142 136  Potassium 3.5 - 5.1 mEq/L 4.0 4.2 3.4(L)  Chloride 101 - 111 mmol/L - - 94(L)  CO2 22 - 29 mEq/L 29 - 33(H)  Calcium 8.4 - 10.4 mg/dL 9.2 - 8.8(L)  Total Protein 6.4 - 8.3 g/dL 7.2 - 6.5  Total Bilirubin 0.20 - 1.20 mg/dL <0.30 - 0.5  Alkaline Phos 40 - 150 U/L 80 -  76  AST 5 - 34 U/L 16 - 29  ALT 0 - 55 U/L 14 - 12(L)   SPEP & IFE with QIG  Status: Finalresult Visible to patient:  Not Released Nextappt: 09/09/2015 at 01:00 PM in Oncology ([facility]) Dx:  Anemia, unspecified anemia type            Ref Range 3wk ago    IgG (Immunoglobin G), Serum 650 - 1600 mg/dL 804   IgA 68 - 379 mg/dL 179   IgM, Serum 41 - 251 mg/dL 27 (L)   Immunofix Electr Int  IFE2,IFE3,MCM   Total Protein, Serum Electrophoresis 6.1 - 8.1 g/dL 6.1   Albumin ELP 3.8 - 4.8 g/dL 3.0 (L)   Alpha-1-Globulin 0.2 - 0.3 g/dL 0.6 (H)   Alpha-2-Globulin 0.5 - 0.9 g/dL 1.1 (H)   Beta Globulin 0.4 - 0.6 g/dL 0.4   Beta 2 0.2 - 0.5 g/dL 0.4   Gamma Globulin 0.8 - 1.7 g/dL 0.7 (L)   Abnormal Protein Band1 g/dL 0.1   SPE Interp.  *   Comments: A restricted band consistent with monoclonal protein is present.The monoclonal protein peak accounts for 0.1 g/dL of the total0.7 g/dL of protein in the gamma region.          RADIOGRAPHIC STUDIES: I have personally reviewed the radiological images as listed and agreed with the findings in the report. No results found.  ASSESSMENT & PLAN:  81 year old Caucasian male with past medical history of prostate cancer in 2008, in remission, Parkinson disease, dementia, orthostatic hypotension, multiple falls at nursing home, presents for anemia evaluation  1. Normocytic hypoproductive anemia, mild iron deficiency  -His repeat hemoglobin today is 9.2, with MCV 95. His absolute reticulocyte count is 60.8, which is low. He has hypoproductive normocytic  anemia  -I discussed his lab results form outside and his previous visit, which  lab test showed normal G53, folic acid, normal ferritin at 151, low serum iron level at 34, saturation 11%, normal TIBC, normal himself went to level, LDH, and soluble transferrin receptor. His testosterone level is low at 183 ng/dl. His SPEP showed a small amount of M protein, and  elevated kappa and lambda light chain level. No elevated Ig level.  -His CMP today showed Cr 1.5, with a GFR around 40. His Cr previously was normal at 0.93 on 04/18/2015 -He likely has component of anemia of chronic disease (from renal insufficiency and other medical conditions), iron deficiency and hypotestosteronemia.  -Giving the positive SPEP, renal failure, and anemia, multiple myeloma is not ruled out, although he could have MGUS. Given his advance age, very limited performance status, his family members are reluctant to have bone marrow biopsy, which is understandable. -His anemia actually has improved since he started taking iron pill. Hemoglobin 10.6 today. -I will hold on Aranesp for now since his anemia is improving  -I recommend continue following his blood counts and renal function, and we'll obtain a bone survey on his next visit.  2. Parkinson's disease, hypertension, dementia, -He will continue follow-up with his primary care physician, cardiologist and neurologist.    Plan -Monthly CBC and BMP at his PCP's office, will contact his PCP and fax orders -I will see him back in 3 months for follow up, and a bone survey. If his blood counts drop in the next few months, I'll certainly see him early.   All questions were answered. The patient knows to call the clinic with any problems, questions or concerns. I spent 20 minutes counseling the patient face to face. The total time spent in the appointment was 30 minutes and more than 50% was on counseling.  Truitt Merle, MD 06/11/2015 8:10 AM

## 2015-06-12 NOTE — Telephone Encounter (Signed)
Left message for SW Vernie Shanks) re contacting patient per referral entered 12/20. Also sent message to YF re entering the order for bone survey.

## 2015-06-14 ENCOUNTER — Telehealth: Payer: Self-pay | Admitting: *Deleted

## 2015-06-14 NOTE — Telephone Encounter (Signed)
Call received in [place] from pt's daughter stating per visit this week- " Dr Burr Medico was going to get my father's labs checked locally in Jan and Feb but our local office states they have not received any orders"  Per daughter- fax number for [facility] for orders is [phone].  Informed daughter this information will forwarded to MD and RN at desk for follow up and faxing of orders.

## 2015-06-15 ENCOUNTER — Encounter: Payer: Self-pay | Admitting: Hematology

## 2015-06-15 NOTE — Telephone Encounter (Signed)
Thu and Myrtle,   Please contact his PCP and fax the lab order of CBC and BMP in one and two month.  Thanks.  Truitt Merle

## 2015-06-18 ENCOUNTER — Other Ambulatory Visit: Payer: Self-pay | Admitting: *Deleted

## 2015-06-18 ENCOUNTER — Telehealth: Payer: Self-pay | Admitting: *Deleted

## 2015-06-18 ENCOUNTER — Encounter: Payer: Self-pay | Admitting: *Deleted

## 2015-06-18 DIAGNOSIS — F0280 Dementia in other diseases classified elsewhere without behavioral disturbance: Secondary | ICD-10-CM

## 2015-06-18 DIAGNOSIS — G3183 Dementia with Lewy bodies: Principal | ICD-10-CM

## 2015-06-18 MED ORDER — UNABLE TO FIND: Status: AC

## 2015-06-18 NOTE — Progress Notes (Signed)
[place] Psychosocial Distress Screening Clinical Social Work  Clinical Social Work was referred by distress screening protocol.  The patient scored a 8 on the Psychosocial Distress Thermometer which indicates severe distress. Clinical Social Worker reviewed chart and phoned daughter to assess for distress and other psychosocial needs. Pt has currently moved to the [place] area and is residing with family there. Per daughter, they are on top of his care needs and deny current concerns. They agree to reach out as needs change.   ONCBCN DISTRESS SCREENING 06/11/2015  Screening Type Initial Screening  Distress experienced in past week (1-10) 8  Family Problem type Partner  Emotional problem type Depression;Feeling hopeless  Spiritual/Religous concerns type Loss of sense of purpose  Physical Problem type Sleep/insomnia;Getting around;Bathing/dressing;Constipation/diarrhea;Changes in urination;Skin dry/itchy    Clinical Social Worker follow up needed: No.  If yes, follow up plan: Loren Racer, [place]  [facility] Phone: [phone] Fax: [phone]

## 2015-06-18 NOTE — Telephone Encounter (Signed)
Faxed lab orders to [facility] to be done in Jan and Feb.  To [facility] as per md's instructions.  Called daughter Zenaida Niece and left message on voice mail for her to follow up with pt's lab appts.  Left message also that orders were faxed to the clinic. [facility]  Fax     [phone].

## 2015-06-19 ENCOUNTER — Encounter: Payer: Self-pay | Admitting: *Deleted

## 2015-06-19 NOTE — Telephone Encounter (Signed)
See other note

## 2015-07-04 ENCOUNTER — Telehealth: Payer: Self-pay | Admitting: Cardiology

## 2015-07-04 NOTE — Telephone Encounter (Signed)
He can come off fludrocortisone after 1 week of taking Northera, if he becomes symptomatic then restart

## 2015-07-04 NOTE — Telephone Encounter (Signed)
Contacted the daughter back to inform her that per Dr Meda Coffee, the pt can come off of fludrocortisone after 1 week of taking Northera, then if he becomes symptomatic then he should restart this med.  Daughter verbalized understanding and agrees with this plan.  Daughter states she will touch base with our office, to provide feedback on how the pt is tolerating the new med change.  Daughter gracious for all the assistance provided.

## 2015-07-04 NOTE — Telephone Encounter (Signed)
Pts daughter calling to inform Dr Meda Coffee that the pt went to see a Neurologist in [place] and he prescribed the pt Northera for parkinsons and it got approved through Neuro for one year.  Daughter states the pt will not start taking the Northera until about 5 -7 days, for that's when the medication will arrive, and she wants to know if when he starts taking this medication, will he need to come off of the fludrocortisone and midodrine, for they were prescribed because pt was denied for Northera last year.  Informed the daughter that I will route this message to Dr Meda Coffee for further review and recommendation and follow-up with her thereafter.  Daughter verbalized understanding and agrees with this plan.

## 2015-07-04 NOTE — Telephone Encounter (Signed)
New problem   Pt's daughter requesting call back to discuss something with you.

## 2015-07-11 ENCOUNTER — Telehealth: Payer: Self-pay | Admitting: Cardiology

## 2015-07-11 NOTE — Telephone Encounter (Signed)
Pts Daughter Darius Castro (on [place]) calling to request that both of her parents, who are well know Dr Meda Coffee pts, medical records to be sent to their new PCP.  Daughter verbalized that their new PCP will need all cardiac records on both pts to be faxed, so they can be treated appropriately and avoid repetition of tests.  Per the Daughter, the new PCP is Dr Jens Som at [facility] at office number--[phone] and fax number [phone]. Informed the daughter that I will notify our medical records representative Maudie Mercury, to have this requested information sent for both pts to the new PCP mentioned above.  Kim made aware of this. Daughter verbalized understanding and gracious for all the assistance provided.

## 2015-07-11 NOTE — Telephone Encounter (Signed)
New message      Talk to Ivy---she only said it was about his medications

## 2015-09-09 ENCOUNTER — Other Ambulatory Visit: Payer: Medicare Other

## 2015-09-09 ENCOUNTER — Encounter: Payer: Self-pay | Admitting: Hematology

## 2015-09-09 ENCOUNTER — Encounter: Payer: Medicare Other | Admitting: Hematology

## 2015-09-09 ENCOUNTER — Telehealth: Payer: Self-pay | Admitting: Hematology

## 2015-09-09 NOTE — Telephone Encounter (Signed)
pt now on hospice will call us back to sched if need

## 2015-09-09 NOTE — Progress Notes (Signed)
This encounter was created in error - please disregard.

## 2016-04-22 DEATH — deceased
